# Patient Record
Sex: Female | Born: 1965 | Race: Black or African American | Hispanic: No | Smoking: Current some day smoker
Health system: Southern US, Community
[De-identification: ages and names within clinical notes are randomized; demographics above are authoritative.]

## PROBLEM LIST (undated history)

## (undated) DIAGNOSIS — F1910 Other psychoactive substance abuse, uncomplicated: Secondary | ICD-10-CM

## (undated) DIAGNOSIS — N200 Calculus of kidney: Secondary | ICD-10-CM

## (undated) DIAGNOSIS — Z21 Asymptomatic human immunodeficiency virus [HIV] infection status: Secondary | ICD-10-CM

## (undated) DIAGNOSIS — T7840XA Allergy, unspecified, initial encounter: Secondary | ICD-10-CM

## (undated) DIAGNOSIS — B20 Human immunodeficiency virus [HIV] disease: Secondary | ICD-10-CM

## (undated) DIAGNOSIS — E785 Hyperlipidemia, unspecified: Secondary | ICD-10-CM

## (undated) DIAGNOSIS — K219 Gastro-esophageal reflux disease without esophagitis: Secondary | ICD-10-CM

## (undated) HISTORY — DX: Hyperlipidemia, unspecified: E78.5

## (undated) HISTORY — PX: APPENDECTOMY: SHX54

## (undated) HISTORY — DX: Other psychoactive substance abuse, uncomplicated: F19.10

## (undated) HISTORY — DX: Calculus of kidney: N20.0

## (undated) HISTORY — PX: CHOLECYSTECTOMY: SHX55

## (undated) HISTORY — PX: TONSILLECTOMY: SUR1361

## (undated) HISTORY — DX: Allergy, unspecified, initial encounter: T78.40XA

## (undated) HISTORY — DX: Gastro-esophageal reflux disease without esophagitis: K21.9

## (undated) HISTORY — PX: INDUCED ABORTION: SHX677

---

## 2004-08-10 ENCOUNTER — Ambulatory Visit: Payer: Self-pay | Admitting: Internal Medicine

## 2004-08-10 LAB — CONVERTED CEMR LAB
HCV Ab: NEGATIVE
Hep A Total Ab: NEGATIVE
Hep B S Ab: NEGATIVE
Hepatitis B Surface Ag: NEGATIVE

## 2004-08-24 ENCOUNTER — Ambulatory Visit: Payer: Self-pay | Admitting: Internal Medicine

## 2004-11-24 ENCOUNTER — Emergency Department (HOSPITAL_COMMUNITY): Admission: EM | Admit: 2004-11-24 | Discharge: 2004-11-24 | Payer: Self-pay | Admitting: Emergency Medicine

## 2005-06-12 ENCOUNTER — Emergency Department (HOSPITAL_COMMUNITY): Admission: EM | Admit: 2005-06-12 | Discharge: 2005-06-12 | Payer: Self-pay | Admitting: Emergency Medicine

## 2005-06-14 ENCOUNTER — Ambulatory Visit: Payer: Self-pay | Admitting: Internal Medicine

## 2005-12-27 ENCOUNTER — Ambulatory Visit: Payer: Self-pay | Admitting: Internal Medicine

## 2005-12-28 ENCOUNTER — Ambulatory Visit (HOSPITAL_COMMUNITY): Admission: RE | Admit: 2005-12-28 | Discharge: 2005-12-28 | Payer: Self-pay | Admitting: Family Medicine

## 2006-01-15 ENCOUNTER — Encounter: Admission: RE | Admit: 2006-01-15 | Discharge: 2006-01-15 | Payer: Self-pay | Admitting: Family Medicine

## 2006-02-07 ENCOUNTER — Emergency Department (HOSPITAL_COMMUNITY): Admission: EM | Admit: 2006-02-07 | Discharge: 2006-02-07 | Payer: Self-pay | Admitting: Emergency Medicine

## 2006-02-22 ENCOUNTER — Emergency Department (HOSPITAL_COMMUNITY): Admission: EM | Admit: 2006-02-22 | Discharge: 2006-02-22 | Payer: Self-pay | Admitting: Emergency Medicine

## 2006-04-04 ENCOUNTER — Ambulatory Visit: Payer: Self-pay | Admitting: Internal Medicine

## 2006-05-29 ENCOUNTER — Emergency Department (HOSPITAL_COMMUNITY): Admission: EM | Admit: 2006-05-29 | Discharge: 2006-05-29 | Payer: Self-pay | Admitting: Emergency Medicine

## 2006-08-01 ENCOUNTER — Ambulatory Visit: Payer: Self-pay | Admitting: Internal Medicine

## 2006-08-22 ENCOUNTER — Ambulatory Visit: Payer: Self-pay | Admitting: Internal Medicine

## 2006-11-18 ENCOUNTER — Ambulatory Visit: Payer: Self-pay | Admitting: Internal Medicine

## 2007-01-13 DIAGNOSIS — K219 Gastro-esophageal reflux disease without esophagitis: Secondary | ICD-10-CM | POA: Insufficient documentation

## 2007-01-13 DIAGNOSIS — J309 Allergic rhinitis, unspecified: Secondary | ICD-10-CM | POA: Insufficient documentation

## 2007-01-13 DIAGNOSIS — B20 Human immunodeficiency virus [HIV] disease: Secondary | ICD-10-CM | POA: Insufficient documentation

## 2007-01-13 DIAGNOSIS — G4700 Insomnia, unspecified: Secondary | ICD-10-CM | POA: Insufficient documentation

## 2007-07-31 ENCOUNTER — Ambulatory Visit: Payer: Self-pay | Admitting: Internal Medicine

## 2007-07-31 LAB — CONVERTED CEMR LAB
ALT: 18 units/L (ref 0–35)
AST: 21 units/L (ref 0–37)
Absolute CD4: 442 #/uL (ref 381–1469)
Albumin: 4.4 g/dL (ref 3.5–5.2)
Alkaline Phosphatase: 100 units/L (ref 39–117)
BUN: 13 mg/dL (ref 6–23)
Basophils Absolute: 0 10*3/uL (ref 0.0–0.1)
Basophils Relative: 0 % (ref 0–1)
CD4 T Helper %: 23 % — ABNORMAL LOW (ref 32–62)
CO2: 24 meq/L (ref 19–32)
Calcium: 9.5 mg/dL (ref 8.4–10.5)
Chloride: 105 meq/L (ref 96–112)
Creatinine, Ser: 0.84 mg/dL (ref 0.40–1.20)
Eosinophils Absolute: 0.1 10*3/uL (ref 0.0–0.7)
Eosinophils Relative: 1 % (ref 0–5)
Glucose, Bld: 88 mg/dL (ref 70–99)
HCT: 35.3 % — ABNORMAL LOW (ref 36.0–46.0)
HIV 1 RNA Quant: <50 copies/mL (ref <50)
HIV-1 RNA Quant, Log: <1.7 (ref <1.70)
Hemoglobin: 11.7 g/dL — ABNORMAL LOW (ref 12.0–15.0)
Lymphocytes Relative: 32 % (ref 12–46)
Lymphs Abs: 1.9 10*3/uL (ref 0.7–4.0)
MCHC: 33.1 g/dL (ref 30.0–36.0)
MCV: 95.7 fL (ref 78.0–100.0)
Monocytes Absolute: 0.3 10*3/uL (ref 0.1–1.0)
Monocytes Relative: 5 % (ref 3–12)
Neutro Abs: 3.7 10*3/uL (ref 1.7–7.7)
Neutrophils Relative %: 62 % (ref 43–77)
Platelets: 284 10*3/uL (ref 150–400)
Potassium: 4.1 meq/L (ref 3.5–5.3)
RBC: 3.69 M/uL — ABNORMAL LOW (ref 3.87–5.11)
RDW: 12.7 % (ref 11.5–15.5)
Sodium: 139 meq/L (ref 135–145)
Total Bilirubin: 0.4 mg/dL (ref 0.3–1.2)
Total Lymphocytes %: 32 % (ref 12–46)
Total Protein: 8 g/dL (ref 6.0–8.3)
Total lymphocyte count: 1920 cells/mcL (ref 700–3300)
WBC, lymph enumeration: 6 10*3/uL (ref 4.0–10.5)
WBC: 6 10*3/uL (ref 4.0–10.5)

## 2007-08-05 ENCOUNTER — Ambulatory Visit: Payer: Self-pay | Admitting: Internal Medicine

## 2007-08-05 ENCOUNTER — Encounter: Payer: Self-pay | Admitting: Internal Medicine

## 2007-08-05 LAB — CONVERTED CEMR LAB
Chlamydia, DNA Probe: NEGATIVE
GC Probe Amp, Genital: POSITIVE — AB
Pap Smear: NEGATIVE

## 2007-08-07 ENCOUNTER — Ambulatory Visit: Payer: Self-pay | Admitting: Internal Medicine

## 2007-08-15 ENCOUNTER — Emergency Department (HOSPITAL_COMMUNITY): Admission: EM | Admit: 2007-08-15 | Discharge: 2007-08-15 | Payer: Self-pay | Admitting: Emergency Medicine

## 2007-08-21 ENCOUNTER — Ambulatory Visit: Payer: Self-pay | Admitting: Internal Medicine

## 2007-10-29 ENCOUNTER — Emergency Department (HOSPITAL_COMMUNITY): Admission: EM | Admit: 2007-10-29 | Discharge: 2007-10-29 | Payer: Self-pay | Admitting: Podiatry

## 2008-01-29 ENCOUNTER — Encounter: Payer: Self-pay | Admitting: Internal Medicine

## 2008-01-29 ENCOUNTER — Ambulatory Visit: Payer: Self-pay | Admitting: Internal Medicine

## 2008-01-29 LAB — CONVERTED CEMR LAB
ALT: 14 units/L (ref 0–35)
AST: 14 units/L (ref 0–37)
Absolute CD4: 430 #/uL (ref 381–1469)
Albumin: 4.3 g/dL (ref 3.5–5.2)
Alkaline Phosphatase: 91 units/L (ref 39–117)
BUN: 11 mg/dL (ref 6–23)
Basophils Absolute: 0 10*3/uL (ref 0.0–0.1)
Basophils Relative: 1 % (ref 0–1)
CD4 T Helper %: 24 % — ABNORMAL LOW (ref 32–62)
CO2: 23 meq/L (ref 19–32)
Calcium: 9 mg/dL (ref 8.4–10.5)
Chlamydia, Swab/Urine, PCR: NEGATIVE
Chloride: 107 meq/L (ref 96–112)
Creatinine, Ser: 0.72 mg/dL (ref 0.40–1.20)
Eosinophils Absolute: 0.1 10*3/uL (ref 0.0–0.7)
Eosinophils Relative: 1 % (ref 0–5)
FSH: 5.1 milliintl units/mL
GC Probe Amp, Urine: NEGATIVE
Glucose, Bld: 88 mg/dL (ref 70–99)
HCT: 33.1 % — ABNORMAL LOW (ref 36.0–46.0)
HIV 1 RNA Quant: <50 copies/mL (ref <50)
HIV-1 RNA Quant, Log: <1.7 (ref <1.70)
Hemoglobin: 10.7 g/dL — ABNORMAL LOW (ref 12.0–15.0)
Lymphocytes Relative: 28 % (ref 12–46)
Lymphs Abs: 1.8 10*3/uL (ref 0.7–4.0)
MCHC: 32.3 g/dL (ref 30.0–36.0)
MCV: 97.1 fL (ref 78.0–100.0)
Monocytes Absolute: 0.4 10*3/uL (ref 0.1–1.0)
Monocytes Relative: 7 % (ref 3–12)
Neutro Abs: 4.1 10*3/uL (ref 1.7–7.7)
Neutrophils Relative %: 64 % (ref 43–77)
Platelets: 297 10*3/uL (ref 150–400)
Potassium: 3.5 meq/L (ref 3.5–5.3)
RBC: 3.41 M/uL — ABNORMAL LOW (ref 3.87–5.11)
RDW: 12.8 % (ref 11.5–15.5)
Sodium: 140 meq/L (ref 135–145)
Total Bilirubin: 0.5 mg/dL (ref 0.3–1.2)
Total Lymphocytes %: 28 % (ref 12–46)
Total Protein: 7.8 g/dL (ref 6.0–8.3)
Total lymphocyte count: 1792 cells/mcL (ref 700–3300)
WBC, lymph enumeration: 6.4 10*3/uL (ref 4.0–10.5)
WBC: 6.4 10*3/uL (ref 4.0–10.5)

## 2008-03-11 ENCOUNTER — Ambulatory Visit: Payer: Self-pay | Admitting: Internal Medicine

## 2008-03-18 ENCOUNTER — Ambulatory Visit: Payer: Self-pay | Admitting: Internal Medicine

## 2008-06-04 ENCOUNTER — Emergency Department (HOSPITAL_COMMUNITY): Admission: EM | Admit: 2008-06-04 | Discharge: 2008-06-05 | Payer: Self-pay | Admitting: *Deleted

## 2008-06-10 ENCOUNTER — Encounter: Payer: Self-pay | Admitting: Internal Medicine

## 2008-06-10 ENCOUNTER — Ambulatory Visit: Payer: Self-pay | Admitting: Internal Medicine

## 2008-06-10 LAB — CONVERTED CEMR LAB
ALT: 11 units/L (ref 0–35)
AST: 14 units/L (ref 0–37)
Absolute CD4: 535 #/uL (ref 381–1469)
Albumin: 4.2 g/dL (ref 3.5–5.2)
Alkaline Phosphatase: 100 units/L (ref 39–117)
BUN: 14 mg/dL (ref 6–23)
Basophils Absolute: 0 10*3/uL (ref 0.0–0.1)
Basophils Relative: 0 % (ref 0–1)
CD4 T Helper %: 27 % — ABNORMAL LOW (ref 32–62)
CO2: 26 meq/L (ref 19–32)
Calcium: 9.6 mg/dL (ref 8.4–10.5)
Chloride: 102 meq/L (ref 96–112)
Creatinine, Ser: 0.86 mg/dL (ref 0.40–1.20)
Eosinophils Absolute: 0.1 10*3/uL (ref 0.0–0.7)
Eosinophils Relative: 2 % (ref 0–5)
Glucose, Bld: 86 mg/dL (ref 70–99)
HCT: 36.2 % (ref 36.0–46.0)
HIV 1 RNA Quant: <48 copies/mL (ref <48)
HIV-1 RNA Quant, Log: <1.68 (ref <1.68)
Hemoglobin: 11.6 g/dL — ABNORMAL LOW (ref 12.0–15.0)
Lymphocytes Relative: 22 % (ref 12–46)
Lymphs Abs: 2 10*3/uL (ref 0.7–4.0)
MCHC: 32 g/dL (ref 30.0–36.0)
MCV: 95 fL (ref 78.0–100.0)
Monocytes Absolute: 0.5 10*3/uL (ref 0.1–1.0)
Monocytes Relative: 6 % (ref 3–12)
Neutro Abs: 6.3 10*3/uL (ref 1.7–7.7)
Neutrophils Relative %: 70 % (ref 43–77)
Platelets: 390 10*3/uL (ref 150–400)
Potassium: 4.4 meq/L (ref 3.5–5.3)
RBC: 3.81 M/uL — ABNORMAL LOW (ref 3.87–5.11)
RDW: 12.4 % (ref 11.5–15.5)
Sodium: 141 meq/L (ref 135–145)
Total Bilirubin: 0.4 mg/dL (ref 0.3–1.2)
Total Lymphocytes %: 22 % (ref 12–46)
Total Protein: 8.2 g/dL (ref 6.0–8.3)
Total lymphocyte count: 1980 cells/mcL (ref 700–3300)
WBC, lymph enumeration: 9 10*3/uL (ref 4.0–10.5)
WBC: 9 10*3/uL (ref 4.0–10.5)

## 2008-08-05 ENCOUNTER — Telehealth: Payer: Self-pay | Admitting: Internal Medicine

## 2008-08-10 ENCOUNTER — Telehealth (INDEPENDENT_AMBULATORY_CARE_PROVIDER_SITE_OTHER): Payer: Self-pay | Admitting: *Deleted

## 2008-08-19 ENCOUNTER — Encounter: Payer: Self-pay | Admitting: Internal Medicine

## 2008-08-19 ENCOUNTER — Ambulatory Visit: Payer: Self-pay | Admitting: Internal Medicine

## 2008-08-19 DIAGNOSIS — G589 Mononeuropathy, unspecified: Secondary | ICD-10-CM | POA: Insufficient documentation

## 2008-08-19 LAB — CONVERTED CEMR LAB
ALT: 11 units/L (ref 0–35)
AST: 13 units/L (ref 0–37)
Absolute CD4: 392 #/uL (ref 381–1469)
Albumin: 4.2 g/dL (ref 3.5–5.2)
Alkaline Phosphatase: 93 units/L (ref 39–117)
BUN: 13 mg/dL (ref 6–23)
Basophils Absolute: 0 10*3/uL (ref 0.0–0.1)
Basophils Relative: 1 % (ref 0–1)
CD4 Count: 392 microliters
CD4 T Helper %: 25 % — ABNORMAL LOW (ref 32–62)
CO2: 25 meq/L (ref 19–32)
Calcium: 9.5 mg/dL (ref 8.4–10.5)
Chloride: 104 meq/L (ref 96–112)
Creatinine, Ser: 0.77 mg/dL (ref 0.40–1.20)
Eosinophils Absolute: 0.2 10*3/uL (ref 0.0–0.7)
Eosinophils Relative: 2 % (ref 0–5)
Glucose, Bld: 96 mg/dL (ref 70–99)
HCT: 35 % — ABNORMAL LOW (ref 36.0–46.0)
HIV 1 RNA Quant: <48 copies/mL (ref <48)
HIV-1 RNA Quant, Log: <1.68 (ref <1.68)
Hemoglobin: 11.3 g/dL — ABNORMAL LOW (ref 12.0–15.0)
Lymphocytes Relative: 18 % (ref 12–46)
Lymphs Abs: 1.6 10*3/uL (ref 0.7–4.0)
MCHC: 32.3 g/dL (ref 30.0–36.0)
MCV: 96.7 fL (ref 78.0–100.0)
Monocytes Absolute: 0.7 10*3/uL (ref 0.1–1.0)
Monocytes Relative: 8 % (ref 3–12)
Neutro Abs: 6.2 10*3/uL (ref 1.7–7.7)
Neutrophils Relative %: 71 % (ref 43–77)
Platelets: 252 10*3/uL (ref 150–400)
Potassium: 4.2 meq/L (ref 3.5–5.3)
RBC: 3.62 M/uL — ABNORMAL LOW (ref 3.87–5.11)
RDW: 13.3 % (ref 11.5–15.5)
Sodium: 139 meq/L (ref 135–145)
Total Bilirubin: 0.6 mg/dL (ref 0.3–1.2)
Total Lymphocytes %: 18 % (ref 12–46)
Total Protein: 7.7 g/dL (ref 6.0–8.3)
Total lymphocyte count: 1566 cells/mcL (ref 700–3300)
WBC, lymph enumeration: 8.7 10*3/uL (ref 4.0–10.5)
WBC: 8.7 10*3/uL (ref 4.0–10.5)

## 2008-08-22 ENCOUNTER — Ambulatory Visit (HOSPITAL_COMMUNITY): Admission: RE | Admit: 2008-08-22 | Discharge: 2008-08-22 | Payer: Self-pay | Admitting: Internal Medicine

## 2008-09-02 ENCOUNTER — Telehealth: Payer: Self-pay | Admitting: Internal Medicine

## 2008-09-16 ENCOUNTER — Telehealth: Payer: Self-pay | Admitting: Internal Medicine

## 2008-09-27 ENCOUNTER — Telehealth: Payer: Self-pay | Admitting: Internal Medicine

## 2008-10-01 ENCOUNTER — Encounter: Payer: Self-pay | Admitting: Internal Medicine

## 2008-10-27 ENCOUNTER — Encounter: Payer: Self-pay | Admitting: Internal Medicine

## 2008-12-09 ENCOUNTER — Encounter: Payer: Self-pay | Admitting: Internal Medicine

## 2008-12-09 ENCOUNTER — Ambulatory Visit: Payer: Self-pay | Admitting: Internal Medicine

## 2008-12-09 DIAGNOSIS — M5020 Other cervical disc displacement, unspecified cervical region: Secondary | ICD-10-CM | POA: Insufficient documentation

## 2008-12-09 LAB — CONVERTED CEMR LAB
ALT: 15 units/L (ref 0–35)
AST: 19 units/L (ref 0–37)
Absolute CD4: 671 #/uL (ref 381–1469)
Albumin: 4.5 g/dL (ref 3.5–5.2)
Alkaline Phosphatase: 101 units/L (ref 39–117)
BUN: 16 mg/dL (ref 6–23)
Basophils Absolute: 0 10*3/uL (ref 0.0–0.1)
Basophils Relative: 0 % (ref 0–1)
CD4 T Helper %: 26 % — ABNORMAL LOW (ref 32–62)
CO2: 25 meq/L (ref 19–32)
Calcium: 9.3 mg/dL (ref 8.4–10.5)
Chloride: 103 meq/L (ref 96–112)
Creatinine, Ser: 1.09 mg/dL (ref 0.40–1.20)
Eosinophils Absolute: 0.1 10*3/uL (ref 0.0–0.7)
Eosinophils Relative: 1 % (ref 0–5)
Glucose, Bld: 98 mg/dL (ref 70–99)
HCT: 34.8 % — ABNORMAL LOW (ref 36.0–46.0)
HIV 1 RNA Quant: <48 copies/mL (ref <48)
HIV-1 RNA Quant, Log: <1.68 (ref <1.68)
Hemoglobin: 11.7 g/dL — ABNORMAL LOW (ref 12.0–15.0)
Lymphocytes Relative: 30 % (ref 12–46)
Lymphs Abs: 2.6 10*3/uL (ref 0.7–4.0)
MCHC: 33.6 g/dL (ref 30.0–36.0)
MCV: 92.3 fL (ref 78.0–100.0)
Monocytes Absolute: 0.6 10*3/uL (ref 0.1–1.0)
Monocytes Relative: 7 % (ref 3–12)
Neutro Abs: 5.3 10*3/uL (ref 1.7–7.7)
Neutrophils Relative %: 62 % (ref 43–77)
Platelets: 278 10*3/uL (ref 150–400)
Potassium: 4 meq/L (ref 3.5–5.3)
RBC: 3.77 M/uL — ABNORMAL LOW (ref 3.87–5.11)
RDW: 13.2 % (ref 11.5–15.5)
Sodium: 139 meq/L (ref 135–145)
Total Bilirubin: 0.5 mg/dL (ref 0.3–1.2)
Total Protein: 8.1 g/dL (ref 6.0–8.3)
Total lymphocyte count: 2580 cells/mcL (ref 700–3300)
WBC: 8.6 10*3/uL (ref 4.0–10.5)

## 2008-12-23 ENCOUNTER — Encounter: Payer: Self-pay | Admitting: Internal Medicine

## 2008-12-23 ENCOUNTER — Ambulatory Visit: Payer: Self-pay | Admitting: Internal Medicine

## 2009-03-11 ENCOUNTER — Telehealth: Payer: Self-pay | Admitting: Internal Medicine

## 2009-03-28 ENCOUNTER — Telehealth: Payer: Self-pay | Admitting: Internal Medicine

## 2009-04-07 ENCOUNTER — Ambulatory Visit: Payer: Self-pay | Admitting: Family Medicine

## 2009-04-07 ENCOUNTER — Encounter: Payer: Self-pay | Admitting: Internal Medicine

## 2009-04-07 LAB — CONVERTED CEMR LAB
ALT: 20 units/L (ref 0–35)
AST: 18 units/L (ref 0–37)
Absolute CD4: 588 #/uL (ref 381–1469)
Albumin: 4 g/dL (ref 3.5–5.2)
Alkaline Phosphatase: 105 units/L (ref 39–117)
BUN: 9 mg/dL (ref 6–23)
Basophils Absolute: 0 10*3/uL (ref 0.0–0.1)
Basophils Relative: 0 % (ref 0–1)
CD4 T Helper %: 30 % — ABNORMAL LOW (ref 32–62)
CO2: 23 meq/L (ref 19–32)
Calcium: 8.8 mg/dL (ref 8.4–10.5)
Chloride: 106 meq/L (ref 96–112)
Creatinine, Ser: 0.79 mg/dL (ref 0.40–1.20)
Eosinophils Absolute: 0.2 10*3/uL (ref 0.0–0.7)
Eosinophils Relative: 2 % (ref 0–5)
Glucose, Bld: 71 mg/dL (ref 70–99)
HCT: 35.2 % — ABNORMAL LOW (ref 36.0–46.0)
HIV 1 RNA Quant: 82 copies/mL — ABNORMAL HIGH (ref <48)
HIV-1 RNA Quant, Log: 1.91 — ABNORMAL HIGH (ref <1.68)
Hemoglobin: 11.1 g/dL — ABNORMAL LOW (ref 12.0–15.0)
Lymphocytes Relative: 28 % (ref 12–46)
Lymphs Abs: 2 10*3/uL (ref 0.7–4.0)
MCHC: 31.5 g/dL (ref 30.0–36.0)
MCV: 98.1 fL (ref 78.0–100.0)
Monocytes Absolute: 0.6 10*3/uL (ref 0.1–1.0)
Monocytes Relative: 9 % (ref 3–12)
Neutro Abs: 4.3 10*3/uL (ref 1.7–7.7)
Neutrophils Relative %: 61 % (ref 43–77)
Platelets: 288 10*3/uL (ref 150–400)
Potassium: 4 meq/L (ref 3.5–5.3)
RBC: 3.59 M/uL — ABNORMAL LOW (ref 3.87–5.11)
RDW: 12.6 % (ref 11.5–15.5)
Sodium: 143 meq/L (ref 135–145)
Total Bilirubin: 0.5 mg/dL (ref 0.3–1.2)
Total Protein: 7.5 g/dL (ref 6.0–8.3)
Total lymphocyte count: 1960 cells/mcL (ref 700–3300)
WBC: 7 10*3/uL (ref 4.0–10.5)

## 2009-04-22 ENCOUNTER — Telehealth: Payer: Self-pay | Admitting: Internal Medicine

## 2009-04-28 ENCOUNTER — Telehealth: Payer: Self-pay | Admitting: Internal Medicine

## 2009-06-20 ENCOUNTER — Encounter: Payer: Self-pay | Admitting: Internal Medicine

## 2009-06-20 ENCOUNTER — Ambulatory Visit: Payer: Self-pay | Admitting: Internal Medicine

## 2009-06-20 LAB — CONVERTED CEMR LAB
ALT: 11 units/L (ref 0–35)
AST: 15 units/L (ref 0–37)
Absolute CD4: 730 #/uL (ref 381–1469)
Albumin: 4.5 g/dL (ref 3.5–5.2)
Alkaline Phosphatase: 115 units/L (ref 39–117)
BUN: 19 mg/dL (ref 6–23)
Basophils Absolute: 0 10*3/uL (ref 0.0–0.1)
Basophils Relative: 0 % (ref 0–1)
CD4 T Helper %: 28 % — ABNORMAL LOW (ref 32–62)
CO2: 23 meq/L (ref 19–32)
Calcium: 9.6 mg/dL (ref 8.4–10.5)
Chloride: 104 meq/L (ref 96–112)
Cholesterol: 240 mg/dL — ABNORMAL HIGH (ref 0–200)
Creatinine, Ser: 1 mg/dL (ref 0.40–1.20)
Eosinophils Absolute: 0.1 10*3/uL (ref 0.0–0.7)
Eosinophils Relative: 1 % (ref 0–5)
Glucose, Bld: 92 mg/dL (ref 70–99)
HCT: 38.1 % (ref 36.0–46.0)
HDL: 45 mg/dL (ref >39)
HIV 1 RNA Quant: <48 copies/mL (ref <48)
HIV-1 RNA Quant, Log: <1.68 (ref <1.68)
Hemoglobin: 12.6 g/dL (ref 12.0–15.0)
LDL Cholesterol: 136 mg/dL — ABNORMAL HIGH (ref 0–99)
Lymphocytes Relative: 33 % (ref 12–46)
Lymphs Abs: 2.6 10*3/uL (ref 0.7–4.0)
MCHC: 33.1 g/dL (ref 30.0–36.0)
MCV: 92.5 fL (ref 78.0–100.0)
Monocytes Absolute: 0.6 10*3/uL (ref 0.1–1.0)
Monocytes Relative: 8 % (ref 3–12)
Neutro Abs: 4.5 10*3/uL (ref 1.7–7.7)
Neutrophils Relative %: 58 % (ref 43–77)
Platelets: 268 10*3/uL (ref 150–400)
Potassium: 4.5 meq/L (ref 3.5–5.3)
RBC: 4.12 M/uL (ref 3.87–5.11)
RDW: 13 % (ref 11.5–15.5)
Sodium: 141 meq/L (ref 135–145)
Total Bilirubin: 0.5 mg/dL (ref 0.3–1.2)
Total CHOL/HDL Ratio: 5.3
Total Protein: 7.9 g/dL (ref 6.0–8.3)
Total lymphocyte count: 2607 cells/mcL (ref 700–3300)
Triglycerides: 294 mg/dL — ABNORMAL HIGH (ref <150)
VLDL: 59 mg/dL — ABNORMAL HIGH (ref 0–40)
WBC: 7.9 10*3/uL (ref 4.0–10.5)

## 2009-07-07 ENCOUNTER — Ambulatory Visit: Payer: Self-pay | Admitting: Internal Medicine

## 2009-07-07 ENCOUNTER — Encounter: Payer: Self-pay | Admitting: Internal Medicine

## 2009-07-07 DIAGNOSIS — N952 Postmenopausal atrophic vaginitis: Secondary | ICD-10-CM | POA: Insufficient documentation

## 2009-07-08 ENCOUNTER — Encounter: Admission: RE | Admit: 2009-07-08 | Discharge: 2009-07-08 | Payer: Self-pay | Admitting: Internal Medicine

## 2009-07-20 ENCOUNTER — Encounter: Admission: RE | Admit: 2009-07-20 | Discharge: 2009-07-20 | Payer: Self-pay | Admitting: Internal Medicine

## 2009-08-22 ENCOUNTER — Emergency Department (HOSPITAL_COMMUNITY): Admission: EM | Admit: 2009-08-22 | Discharge: 2009-08-22 | Payer: Self-pay | Admitting: Emergency Medicine

## 2009-08-23 ENCOUNTER — Telehealth: Payer: Self-pay | Admitting: Internal Medicine

## 2009-09-28 ENCOUNTER — Ambulatory Visit: Payer: Self-pay | Admitting: Internal Medicine

## 2009-09-28 LAB — CONVERTED CEMR LAB
ALT: 14 units/L (ref 0–35)
AST: 17 units/L (ref 0–37)
Albumin: 4.5 g/dL (ref 3.5–5.2)
Alkaline Phosphatase: 126 units/L — ABNORMAL HIGH (ref 39–117)
BUN: 16 mg/dL (ref 6–23)
Basophils Absolute: 0 10*3/uL (ref 0.0–0.1)
Basophils Relative: 0 % (ref 0–1)
CO2: 23 meq/L (ref 19–32)
Calcium: 9.8 mg/dL (ref 8.4–10.5)
Chloride: 103 meq/L (ref 96–112)
Creatinine, Ser: 1.07 mg/dL (ref 0.40–1.20)
Eosinophils Absolute: 0.1 10*3/uL (ref 0.0–0.7)
Eosinophils Relative: 1 % (ref 0–5)
Glucose, Bld: 83 mg/dL (ref 70–99)
HCT: 34.5 % — ABNORMAL LOW (ref 36.0–46.0)
HIV 1 RNA Quant: <48 copies/mL (ref <48)
HIV-1 RNA Quant, Log: <1.68 (ref <1.68)
Hemoglobin: 11.6 g/dL — ABNORMAL LOW (ref 12.0–15.0)
Lymphocytes Relative: 24 % (ref 12–46)
Lymphs Abs: 2.2 10*3/uL (ref 0.7–4.0)
MCHC: 33.6 g/dL (ref 30.0–36.0)
MCV: 92.5 fL (ref 78.0–100.0)
Monocytes Absolute: 0.5 10*3/uL (ref 0.1–1.0)
Monocytes Relative: 5 % (ref 3–12)
Neutro Abs: 6.6 10*3/uL (ref 1.7–7.7)
Neutrophils Relative %: 70 % (ref 43–77)
Platelets: 281 10*3/uL (ref 150–400)
Potassium: 4 meq/L (ref 3.5–5.3)
RBC: 3.73 M/uL — ABNORMAL LOW (ref 3.87–5.11)
RDW: 12.5 % (ref 11.5–15.5)
Sodium: 137 meq/L (ref 135–145)
Total Bilirubin: 0.7 mg/dL (ref 0.3–1.2)
Total Protein: 7.9 g/dL (ref 6.0–8.3)
WBC: 9.4 10*3/uL (ref 4.0–10.5)

## 2009-10-21 ENCOUNTER — Ambulatory Visit: Payer: Self-pay | Admitting: Internal Medicine

## 2009-10-21 ENCOUNTER — Encounter (INDEPENDENT_AMBULATORY_CARE_PROVIDER_SITE_OTHER): Payer: Self-pay | Admitting: *Deleted

## 2009-10-21 DIAGNOSIS — N951 Menopausal and female climacteric states: Secondary | ICD-10-CM | POA: Insufficient documentation

## 2009-10-21 LAB — CONVERTED CEMR LAB: Pap Smear: NEGATIVE

## 2009-11-04 ENCOUNTER — Emergency Department (HOSPITAL_COMMUNITY): Admission: EM | Admit: 2009-11-04 | Discharge: 2009-11-04 | Payer: Self-pay | Admitting: Emergency Medicine

## 2009-11-08 ENCOUNTER — Encounter: Payer: Self-pay | Admitting: Internal Medicine

## 2009-11-18 ENCOUNTER — Ambulatory Visit: Payer: Self-pay | Admitting: Internal Medicine

## 2009-11-18 DIAGNOSIS — N3000 Acute cystitis without hematuria: Secondary | ICD-10-CM | POA: Insufficient documentation

## 2009-11-25 ENCOUNTER — Telehealth: Payer: Self-pay | Admitting: Internal Medicine

## 2010-01-04 ENCOUNTER — Encounter: Payer: Self-pay | Admitting: Internal Medicine

## 2010-02-28 ENCOUNTER — Telehealth (INDEPENDENT_AMBULATORY_CARE_PROVIDER_SITE_OTHER): Payer: Self-pay | Admitting: *Deleted

## 2010-04-27 ENCOUNTER — Telehealth (INDEPENDENT_AMBULATORY_CARE_PROVIDER_SITE_OTHER): Payer: Self-pay | Admitting: *Deleted

## 2010-05-01 ENCOUNTER — Encounter (INDEPENDENT_AMBULATORY_CARE_PROVIDER_SITE_OTHER): Payer: Self-pay | Admitting: *Deleted

## 2010-05-15 ENCOUNTER — Ambulatory Visit: Payer: Self-pay | Admitting: Internal Medicine

## 2010-05-15 LAB — CONVERTED CEMR LAB
ALT: 20 units/L (ref 0–35)
AST: 23 units/L (ref 0–37)
Albumin: 4.5 g/dL (ref 3.5–5.2)
Alkaline Phosphatase: 128 units/L — ABNORMAL HIGH (ref 39–117)
BUN: 9 mg/dL (ref 6–23)
Basophils Absolute: 0 10*3/uL (ref 0.0–0.1)
Basophils Relative: 0 % (ref 0–1)
CO2: 27 meq/L (ref 19–32)
Calcium: 9.3 mg/dL (ref 8.4–10.5)
Chloride: 102 meq/L (ref 96–112)
Creatinine, Ser: 0.87 mg/dL (ref 0.40–1.20)
Eosinophils Absolute: 0.1 10*3/uL (ref 0.0–0.7)
Eosinophils Relative: 1 % (ref 0–5)
Glucose, Bld: 99 mg/dL (ref 70–99)
HCT: 36.9 % (ref 36.0–46.0)
HIV 1 RNA Quant: <20 copies/mL (ref <20)
HIV-1 RNA Quant, Log: <1.3 (ref <1.30)
Hemoglobin: 11.8 g/dL — ABNORMAL LOW (ref 12.0–15.0)
Lymphocytes Relative: 25 % (ref 12–46)
Lymphs Abs: 1.9 10*3/uL (ref 0.7–4.0)
MCHC: 32 g/dL (ref 30.0–36.0)
MCV: 96.1 fL (ref 78.0–100.0)
Monocytes Absolute: 0.3 10*3/uL (ref 0.1–1.0)
Monocytes Relative: 4 % (ref 3–12)
Neutro Abs: 5.6 10*3/uL (ref 1.7–7.7)
Neutrophils Relative %: 70 % (ref 43–77)
Platelets: 291 10*3/uL (ref 150–400)
Potassium: 4.1 meq/L (ref 3.5–5.3)
RBC: 3.84 M/uL — ABNORMAL LOW (ref 3.87–5.11)
RDW: 12.2 % (ref 11.5–15.5)
Sodium: 139 meq/L (ref 135–145)
Total Bilirubin: 0.5 mg/dL (ref 0.3–1.2)
Total Protein: 7.7 g/dL (ref 6.0–8.3)
WBC: 7.9 10*3/uL (ref 4.0–10.5)

## 2010-06-15 ENCOUNTER — Ambulatory Visit
Admission: RE | Admit: 2010-06-15 | Discharge: 2010-06-15 | Payer: Self-pay | Source: Home / Self Care | Attending: Internal Medicine | Admitting: Internal Medicine

## 2010-06-15 DIAGNOSIS — F32A Depression, unspecified: Secondary | ICD-10-CM | POA: Insufficient documentation

## 2010-06-15 DIAGNOSIS — F329 Major depressive disorder, single episode, unspecified: Secondary | ICD-10-CM | POA: Insufficient documentation

## 2010-07-02 ENCOUNTER — Encounter: Payer: Self-pay | Admitting: Internal Medicine

## 2010-07-11 NOTE — Miscellaneous (Signed)
Summary: Orders Update  Clinical Lists Changes  Orders: Added new Test order of T-CBC w/Diff (85025-10010) - Signed Added new Test order of T-CD4SP ([facility]) (CD4SP) - Signed Added new Test order of T-Comprehensive Metabolic Panel (80053-22900) - Signed Added new Test order of T-HIV Viral Load (87536-83319) - Signed 

## 2010-07-11 NOTE — Progress Notes (Signed)
Summary: tramadol refill  Phone Note From Pharmacy   Caller: [facility] Call For: Dr. Philipp Deputy  Reason for Call: Needs renewal Summary of Call: Faxed requested to refill Tramadol 50mg .  Please advise.  Follow-up for Phone Call        ok with 3 refills Follow-up by: Yisroel Ramming MD,  April 27, 2010 11:21 AM    Prescriptions: Janean Sark 50 MG  TABS (TRAMADOL HCL) Take 1 tablet by mouth every 8 hours  #120 x 3   Entered by:   Jennet Maduro RN   Authorized by:   Yisroel Ramming MD   Signed by:   Jennet Maduro RN on 05/01/2010   Method used:   Telephoned to ...       [facility] (mail-order)       [address] [place], [place]  [address]       Ph: [phone]       Fax: [phone]   RxID:   [id number]

## 2010-07-11 NOTE — Miscellaneous (Signed)
Summary: Office Visit ([facility])    Vital Signs:  Patient profile:   45 year old female Menstrual status:  irregular Weight:      175.8 pounds Temp:     97.9 degrees F oral Pulse rate:   66 / minute Pulse rhythm:   regular Resp:     18 per minute BP sitting:   119 / 79  (left arm)  Vitals Entered By: Sharen Heck RN (July 07, 2009 3:04 PM) CC: annual exam Is Patient Diabetic? No Pain Assessment Patient in pain? no       Does patient need assistance? Functional Status Self care Ambulation Normal, Bedbound   CC:  annual exam.  History of Present Illness: Pt here for PE.  She c/o some vaginal dryness.  PAP scheduled next week.  Preventive Screening-Counseling & Management  Alcohol-Tobacco     Alcohol drinks/day: 0     Smoking Status: current     Smoking Cessation Counseling: yes     Packs/Day: 0.25     Year Started: 1980  Caffeine-Diet-Exercise     Caffeine use/day: 8     Does Patient Exercise: no  Current Problems (verified): 1)  Herniated Cervical Disc  (ICD-722.0) 2)  Neuropathy  (ICD-355.9) 3)  Allergic Rhinitis  (ICD-477.9) 4)  Insomnia, Chronic  (ICD-307.42) 5)  HIV Disease  (ICD-042) 6)  Gerd  (ICD-530.81)  Current Medications (verified): 1)  Kaletra 200-50 Mg Tabs (Lopinavir-Ritonavir) .... Take 2 Tablets By Mouth Twice A Day 2)  Truvada 200-300 Mg Tabs (Emtricitabine-Tenofovir) .... Take 1 Tablet By Mouth Once A Day 3)  Prilosec 20 Mg  Cpdr (Omeprazole) .... Take 1 Tablet By Mouth Once A Day 4)  Bactrim Ds 800-160 Mg  Tabs (Sulfamethoxazole-Trimethoprim) .... Take 1 Tablet By Mouth Once A Day 5)  Trazodone Hcl 50 Mg  Tabs (Trazodone Hcl) .... Take 1 Tablet By Mouth At Bedtime 6)  Ultram 50 Mg  Tabs (Tramadol Hcl) .... Take 1 Tablet By Mouth Every 8 Hours 7)  Claritin 10 Mg  Tabs (Loratadine) .... Take 1 Tablet By Mouth Once A Day 8)  Gabapentin 300 Mg .... One By Mouth Tid 9)  Premarin 0.625 Mg/gm Crea (Estrogens, Conjugated) .... Aplly  Once Daily As Directed 10)  Triamcinolone Acetonide 0.1 % Crea (Triamcinolone Acetonide) .... Apply Two Times A Day  Allergies (verified): No Known Drug Allergies   Review of Systems  The patient denies anorexia, fever, and weight loss.     Physical Exam  General:  alert, well-developed, well-nourished, and well-hydrated.   Head:  normocephalic and atraumatic.   Eyes:  pupils equal, pupils round, and pupils reactive to light.   Ears:  R ear normal and L ear normal.   Nose:  no external deformity and nose piercing noted.   Mouth:  pharynx pink and moist.   Lungs:  normal breath sounds.   Heart:  normal rate, regular rhythm, and no murmur.   Abdomen:  soft, non-tender, normal bowel sounds, no distention, and no masses.   Msk:  normal ROM and no joint tenderness.     Impression & Recommendations:  Problem # 1:  PREVENTIVE HEALTH CARE (ICD-V70.0)  PE wnl will schedule a mammogram PAP scheduled for next week  Orders: Est. Patient age 12-64 [phone])  Problem # 2:  VAGINITIS, ATROPHIC, MILD (ICD-627.3)  Her updated medication list for this problem includes:    Premarin 0.625 Mg/gm Crea (Estrogens, conjugated) .Marland Kitchen... Aplly once daily as directed  Medications Added to Medication  List This Visit: 1)  Premarin 0.625 Mg/gm Crea (Estrogens, conjugated) .... Aplly once daily as directed 2)  Triamcinolone Acetonide 0.1 % Crea (Triamcinolone acetonide) .... Apply two times a day   Patient Instructions: 1)  Please schedule a follow-up appointment in 3 months. Prescriptions: PREMARIN 0.625 MG/GM CREA (ESTROGENS, CONJUGATED) aplly once daily as directed  #1 month x 1   Entered and Authorized by:   Yisroel Ramming MD   Signed by:   Yisroel Ramming MD on 07/07/2009   Method used:   Print then Give to Patient   RxID:   [id number] TRIAMCINOLONE ACETONIDE 0.1 % CREA (TRIAMCINOLONE ACETONIDE) apply two times a day  #60gm x 1   Entered and Authorized by:   Yisroel Ramming MD   Signed by:    Yisroel Ramming MD on 07/07/2009   Method used:   Print then Give to Patient   RxID:   [id number]

## 2010-07-11 NOTE — Miscellaneous (Signed)
Summary: Vanessa Browning update  Clinical Lists Changes  Observations: Added new observation of PAYOR: Medicaid (10/27/2008 9:51) Added new observation of RWTITLE: D (10/27/2008 9:51) Added new observation of AIDSDAP: No (10/27/2008 9:51) Added new observation of FAMILYSIZE: 1  (10/27/2008 9:51) Added new observation of HIV RISK BEH: Heterosexual contact  (10/27/2008 9:51) Added new observation of INFECTDIS MD: Philipp Deputy  (10/27/2008 9:51) Added new observation of DATE1STVISIT: 08/10/2004  (10/27/2008 9:51) Added new observation of HIV INIT DX: 06/11/2004  (10/27/2008 9:51) Added new observation of MARITAL STAT: Single  (10/27/2008 9:51) Added new observation of GENDER: Female  (10/27/2008 9:51) Added new observation of RACE: African American  (10/27/2008 9:51) Added new observation of RW VITAL STA: Active  (10/27/2008 9:51) Added new observation of PATNTCOUNTY: [place]  (10/27/2008 9:51) Added new observation of RWPARTICIP: No  (10/27/2008 9:51) Added new observation of PCPPROPRESC: Yes  (10/27/2008 9:51) Added new observation of HAARTPRESC: Yes  (10/27/2008 9:51) Added new observation of CD4 COUNT: 392 microliters (08/19/2008 9:51) Added new observation of FLU VAX: Historical  (06/10/2008 10:18) Added new observation of PAP SMEAR: negative  (08/05/2007 9:51) Added new observation of LAST PAP DAT: 08/05/07  (08/05/2007 9:51) Added new observation of HEP C AB: negative  (08/10/2004 9:51) Added new observation of HEP_C_DATE: 08/10/04  (08/10/2004 9:51) Added new observation of ANTI-HBS: negative  (08/10/2004 9:51) Added new observation of HBS_AB_DATE: 08/10/04  (08/10/2004 9:51) Added new observation of HBSAG: negative  (08/10/2004 9:51) Added new observation of HBS_AG_DATE: 08/10/04  (08/10/2004 9:51) Added new observation of ANTI-HAV: negative  (08/10/2004 9:51) Added new observation of HEP_A_DATE: 08/10/04  (08/10/2004 9:51)           Immunization History:  Influenza Immunization  History:    Influenza:  historical (06/10/2008)

## 2010-07-11 NOTE — Miscellaneous (Signed)
Summary: RW Update  Clinical Lists Changes  Observations: Added new observation of DATE1STVISIT: 11/18/2009 (01/04/2010 11:53)

## 2010-07-11 NOTE — Progress Notes (Signed)
Summary: refill/mld  Phone Note Refill Request      Prescriptions: GABAPENTIN 300 MG One by mouth tid  #90 x 6   Entered by:   Paulo Fruit  BS,CPht II,MPH   Authorized by:   Yisroel Ramming MD   Signed by:   Paulo Fruit  BS,CPht II,MPH on 11/25/2009   Method used:   Telephoned to ...       [facility] (mail-order)       [address] [place], [place]  [address]       Ph: [phone]       Fax: [phone]   RxID:   [id number] TRUVADA 200-300 MG TABS (EMTRICITABINE-TENOFOVIR) Take 1 tablet by mouth once a day  #30 x 6   Entered by:   Paulo Fruit  BS,CPht II,MPH   Authorized by:   Yisroel Ramming MD   Signed by:   Paulo Fruit  BS,CPht II,MPH on 11/25/2009   Method used:   Telephoned to ...       [facility] (mail[facility]order)       [address] [place], [place]  [address]       Ph: [phone]       Fax: [phone]   RxID:   [id number]  Paulo Fruit  BS,CPht II,MPH  November 25, 2009 11:05 AM

## 2010-07-11 NOTE — Progress Notes (Signed)
Summary: Tramadol refill request  Phone Note Refill Request   Refills Requested: Medication #1:  ULTRAM 50 MG  TABS Take 1 tablet by mouth every 8 hours   Dosage confirmed as above?Dosage Confirmed   Brand Name Necessary? No   Supply Requested: 1 month   Last Refilled: 08/18/2008  Method Requested: Fax to Local Pharmacy Initial call taken by: Sharen Heck RN,  September 02, 2008 9:18 AM  Follow-up for Phone Call        too early Follow-up by: Yisroel Ramming MD,  September 02, 2008 2:24 PM    Denial faxed to pharmacy. Sharen Heck RN  September 03, 2008 2:39 PM

## 2010-07-11 NOTE — Assessment & Plan Note (Signed)
Summary: er/fu from 5/27 [mkj]   CC:  follow-up visit from ER for UTI  and pt. never started Prempro (lost RX).  History of Present Illness: Pt here for f/u from an ED visit about 2 weeks ago. She went to the Ed with flank and abdominal pain.  she was diagnosed with a UTI and treated with Cipro. Her pain has resolved. She is planning on moving back home to [place] city In august. She wants to make sure she has na ID MD set up before she moves.  I told her to contact her case manager at [facility] for information about HIV Providers in the area.   Preventive Screening-Counseling & Management  Alcohol-Tobacco     Alcohol drinks/day: occasional     Smoking Status: current     Smoking Cessation Counseling: yes     Packs/Day: <0.25     Year Started: 1980  Caffeine-Diet-Exercise     Caffeine use/day: coffee and soda 3-4     Does Patient Exercise: no  Hep-HIV-STD-Contraception     HIV Risk: no risk noted  Safety-Violence-Falls     Seat Belt Use: yes      Sexual History:  n/a.        Drug Use:  former.    Comments: pt. declined condoms   Updated Prior Medication List: KALETRA 200-50 MG TABS (LOPINAVIR-RITONAVIR) Take 2 tablets by mouth twice a day TRUVADA 200-300 MG TABS (EMTRICITABINE-TENOFOVIR) Take 1 tablet by mouth once a day PRILOSEC 20 MG  CPDR (OMEPRAZOLE) Take 1 tablet by mouth once a day TRAZODONE HCL 50 MG  TABS (TRAZODONE HCL) Take 1 tablet by mouth at bedtime ULTRAM 50 MG  TABS (TRAMADOL HCL) Take 1 tablet by mouth every 8 hours CLARITIN 10 MG  TABS (LORATADINE) Take 1 tablet by mouth once a day * GABAPENTIN 300 MG One by mouth tid PREMPRO 0.625-2.5 MG TABS (CONJ ESTROG-MEDROXYPROGEST ACE) Take 1 tablet by mouth once a day ENSURE  LIQD (NUTRITIONAL SUPPLEMENTS) one can two times a day  Current Allergies (reviewed today): No known allergies  Past History:  Past Medical History: Last updated: 01/13/2007 GERD HIV disease Allergic rhinitis  Review of Systems  The  patient denies anorexia, fever, weight loss, and abdominal pain.    Vital Signs:  Patient profile:   45 year old female Menstrual status:  irregular Height:      62 inches (157.48 cm) Weight:      167.4 pounds (76.09 kg) BMI:     30.73 Temp:     98.3 degrees F (36.83 degrees C) oral Pulse rate:   87 / minute BP sitting:   104 / 79  (right arm)  Vitals Entered By: Wendall Mola CMA [place]) (November 18, 2009 10:05 AM) CC: follow-up visit from ER for UTI , pt. never started Prempro (lost RX) Is Patient Diabetic? No Pain Assessment Patient in pain? no      Nutritional Status BMI of > 30 = obese Nutritional Status Detail appetite "good"  Does patient need assistance? Functional Status Self care Ambulation Normal Comments no missed doses of HAART meds per patient   Physical Exam  General:  alert, well-developed, well-nourished, and well-hydrated.   Head:  normocephalic and atraumatic.   Mouth:  pharynx pink and moist.   Lungs:  normal breath sounds.     Impression & Recommendations:  Problem # 1:  Hx of ACUTE CYSTITIS (ICD-595.0)  resolved f/u if symptoms return  Orders: Est. Patient Level III ([address])

## 2010-07-11 NOTE — Progress Notes (Signed)
Summary: med refills/clarify Ultram  Phone Note Refill Request Message from:  Patient on 04/27/09  Refills Requested: Medication #1:  ULTRAM 50 MG  TABS Take 1 tablet by mouth every 8 hours   Dosage confirmed as above?Dosage Confirmed   Brand Name Necessary? No   Supply Requested: 1 month  Medication #2:  PRILOSEC 20 MG  CPDR Take 1 tablet by mouth once a day   Dosage confirmed as above?Dosage Confirmed   Brand Name Necessary? No   Supply Requested: 1 month How many refills of Ulatram can be called in?   Method Requested: Telephone to Pharmacy Initial call taken by: Sharen Heck RN,  April 28, 2009 6:33 PM  Follow-up for Phone Call        3 Follow-up by: Yisroel Ramming MD,  April 29, 2009 11:23 AM    Prescriptions: Janean Sark 50 MG  TABS (TRAMADOL HCL) Take 1 tablet by mouth every 8 hours  #120 x 2   Entered by:   Sharen Heck RN   Authorized by:   Yisroel Ramming MD   Signed by:   Yisroel Ramming MD on 05/03/2009   Method used:   Telephoned to ...       [facility] ([facility])       [address] [place], [place]  [address]       Ph: [phone]       Fax: [phone]   RxID:   [id number] PRILOSEC 20 MG  CPDR (OMEPRAZOLE) Take 1 tablet by mouth once a day  #30 x 3   Entered by:   Sharen Heck RN   Authorized by:   Yisroel Ramming MD   Signed by:   Yisroel Ramming MD on 04/29/2009   Method used:   Telephoned to ...       [facility] ([facility])       [address] [place], [place]  [address]       Ph: [phone]       Fax: [phone]   RxID:   [id number]

## 2010-07-11 NOTE — Miscellaneous (Signed)
Summary: Office Visit ([facility])    Vital Signs:  Patient profile:   45 year old female Menstrual status:  irregular Weight:      170.3 pounds Temp:     98 degrees F oral Pulse rate:   79 / minute Pulse rhythm:   regular Resp:     16 per minute BP sitting:   109 / 73  (left arm)  Vitals Entered By: Sharen Heck RN (April 07, 2009 10:19 AM) CC: f/u 05, desire flu shot and possible B12 injection Is Patient Diabetic? No Pain Assessment Patient in pain? no       Does patient need assistance? Functional Status Self care Ambulation Normal   CC:  f/u 05 and desire flu shot and possible B12 injection.  History of Present Illness: Pt had a URI last week - starting to feel better.  She still has some chest congestion. She would like a B12 shot today.  Preventive Screening-Counseling & Management  Alcohol-Tobacco     Alcohol drinks/day: 0     Smoking Status: current     Smoking Cessation Counseling: yes     Packs/Day: 0.25     Year Started: 1982  Caffeine-Diet-Exercise     Caffeine use/day: 8     Does Patient Exercise: no  Current Problems (verified): 1)  Herniated Cervical Disc  (ICD-722.0) 2)  Neuropathy  (ICD-355.9) 3)  Allergic Rhinitis  (ICD-477.9) 4)  Insomnia, Chronic  (ICD-307.42) 5)  HIV Disease  (ICD-042) 6)  Gerd  (ICD-530.81)  Current Medications (verified): 1)  Kaletra 200-50 Mg Tabs (Lopinavir-Ritonavir) .... Take 2 Tablets By Mouth Twice A Day 2)  Truvada 200-300 Mg Tabs (Emtricitabine-Tenofovir) .... Take 1 Tablet By Mouth Once A Day 3)  Prilosec 20 Mg  Cpdr (Omeprazole) .... Take 1 Tablet By Mouth Once A Day 4)  Bactrim Ds 800-160 Mg  Tabs (Sulfamethoxazole-Trimethoprim) .... Take 1 Tablet By Mouth Once A Day 5)  Trazodone Hcl 50 Mg  Tabs (Trazodone Hcl) .... Take 1 Tablet By Mouth At Bedtime 6)  Ultram 50 Mg  Tabs (Tramadol Hcl) .... Take 1 Tablet By Mouth Every 8 Hours 7)  Claritin 10 Mg  Tabs (Loratadine) .... Take 1 Tablet By Mouth  Once A Day 8)  Gabapentin 300 Mg .... One By Mouth Tid  Allergies (verified): No Known Drug Allergies   Review of Systems  The patient denies anorexia and hemoptysis.     Physical Exam  General:  alert, well-developed, well-nourished, and well-hydrated.   Head:  normocephalic and atraumatic.   Mouth:  pharynx pink and moist.   Lungs:  normal breath sounds.     Impression & Recommendations:  Problem # 1:  HIV DISEASE (ICD-042) Will obtain labs today.  Pt to continue current meds.  Influenza vaccine given today. Orders: Est. Patient Level III ([address]) T-CBC w/Diff [phone]) T-CD4SP [facility]) (CD4SP) T-Comprehensive Metabolic Panel [phone]) T-HIV Viral Load [id number])  Diagnostics Reviewed:  CD4: 392 (08/19/2008)   WBC: 8.6 (12/09/2008)   PMN (bands): 0 (08/19/2008)   Hgb: 11.7 (12/09/2008)   HCT: 34.8 (12/09/2008)   Platelets: 278 (12/09/2008) HIV-1 RNA: <48 copies/mL (12/09/2008)   HBSAg: negative (08/10/2004)  Other Orders: Influenza Vaccine NON MCR (41324) Vit B12 1000 mcg (M0102)   Patient Instructions: 1)  Please schedule a follow-up appointment in 3 months.   Influenza Vaccine    Vaccine Type: Fluvax Non-MCR    Site: right deltoid    Mfr: Sanofi Pasteur    Dose: 0.5 ml  Route: IM    Given by: Sharen Heck RN    Exp. Date: 12/08/2009    Lot #: [id number]    VIS given: 01/02/07 version given April 07, 2009.  Flu Vaccine Consent Questions    Do you have a history of severe allergic reactions to this vaccine? no    Any prior history of allergic reactions to egg and/or gelatin? no    Do you have a sensitivity to the preservative Thimersol? no    Do you have a past history of Guillan-Barre Syndrome? no    Do you currently have an acute febrile illness? no    Have you ever had a severe reaction to latex? no    Vaccine information given and explained to patient? yes    Are you currently pregnant? no    Medication Administration  Injection  # 1:    Medication: Vit B12 1000 mcg    Diagnosis: INSOMNIA, CHRONIC (ICD-307.42)    Route: IM    Site: L deltoid    Exp Date: 09/10/2010    Lot #: [id number]    Mfr: [employer]ent    Patient tolerated injection without complications    Given by: Sharen Heck RN (April 07, 2009 12:48 PM)  Orders Added: 1)  Est. Patient Level III [[address]] 2)  T-CBC w/Diff [24401-02725] 3)  T-CD4SP [facility]) [CD4SP] 4)  T-Comprehensive Metabolic Panel [80053-22900] 5)  T-HIV Viral Load [phone] 6)  Influenza Vaccine NON MCR [00028] 7)  Vit B12 1000 mcg [J3420]

## 2010-07-11 NOTE — Progress Notes (Signed)
Summary: PPD  Phone Note Outgoing Call   Call placed by: Annice Pih Summary of Call: Pt. is due for PPD at next office visit Initial call taken by: Wendall Mola CMA [place] [facility]),  February 28, 2010 3:52 PM

## 2010-07-11 NOTE — Progress Notes (Signed)
Summary: Message  Phone Note Call from Patient Call back at [phone]   Summary of Call: Ptis requesting pCS Initial call taken by: Michelle Nasuti,  September 16, 2008 10:53 AM  Follow-up for Phone Call        ok Follow-up by: Yisroel Ramming MD,  September 16, 2008 11:08 AM  Additional Follow-up for Phone Call Additional follow up Details #1::        order/referral  written and faxed to [facility] [phone]).  **pt is also requesting mri results Additional Follow-up by: Michelle Nasuti,  September 16, 2008 2:21 PM    Additional Follow-up for Phone Call Additional follow up Details #2::    She has a small herniated disc. Would she like a referral to PT? Follow-up by: Yisroel Ramming MD,  September 16, 2008 3:02 PM  Additional Follow-up for Phone Call Additional follow up Details #3:: Details for Additional Follow-up Action Taken: unable to contact pt by phone for mri results, all numbers listed are wrong or disconnected Additional Follow-up by: Michelle Nasuti,  September 20, 2008 4:14 PM

## 2010-07-11 NOTE — Miscellaneous (Signed)
Summary: Office Visit ([facility])    Vitals Entered By: Sharen Heck RN (June 20, 2009 2:54 PM)  History of Present Illness: Pt c/o generalized body aches. She would like to get a physical. She has been taking her meds every day.  Current Problems (verified): 1)  Herniated Cervical Disc  (ICD-722.0) 2)  Neuropathy  (ICD-355.9) 3)  Allergic Rhinitis  (ICD-477.9) 4)  Insomnia, Chronic  (ICD-307.42) 5)  HIV Disease  (ICD-042) 6)  Gerd  (ICD-530.81)  Current Medications (verified): 1)  Kaletra 200-50 Mg Tabs (Lopinavir-Ritonavir) .... Take 2 Tablets By Mouth Twice A Day 2)  Truvada 200-300 Mg Tabs (Emtricitabine-Tenofovir) .... Take 1 Tablet By Mouth Once A Day 3)  Prilosec 20 Mg  Cpdr (Omeprazole) .... Take 1 Tablet By Mouth Once A Day 4)  Bactrim Ds 800-160 Mg  Tabs (Sulfamethoxazole-Trimethoprim) .... Take 1 Tablet By Mouth Once A Day 5)  Trazodone Hcl 50 Mg  Tabs (Trazodone Hcl) .... Take 1 Tablet By Mouth At Bedtime 6)  Ultram 50 Mg  Tabs (Tramadol Hcl) .... Take 1 Tablet By Mouth Every 8 Hours 7)  Claritin 10 Mg  Tabs (Loratadine) .... Take 1 Tablet By Mouth Once A Day 8)  Gabapentin 300 Mg .... One By Mouth Tid  Allergies: No Known Drug Allergies   Review of Systems  The patient denies anorexia, fever, and weight loss.     Physical Exam  General:  alert, well-developed, well-nourished, and well-hydrated.   Head:  normocephalic and atraumatic.   Mouth:  pharynx pink and moist.   Lungs:  normal breath sounds.   Heart:  normal rate and regular rhythm.      Impression & Recommendations:  Problem # 1:  HIV DISEASE (ICD-042) will obtain labs today. Pt is to continue current meds.  Will schedule for a PAP/ Diagnostics Reviewed:  CD4: 392 (08/19/2008)   WBC: 7.0 (04/07/2009)   PMN (bands): 0 (08/19/2008)   Hgb: 11.1 (04/07/2009)   HCT: 35.2 (04/07/2009)   Platelets: 288 (04/07/2009) HIV-1 RNA: 82 (04/07/2009)   HBSAg: negative (08/10/2004)  Other  Orders: T-CD4SP ([facility]) (CD4SP) T-HIV Viral Load [id number]) T-Comprehensive Metabolic Panel [id number]) T-CBC w/Diff (96295-28413) T-Lipid Profile (24401-02725) Est. Patient Level III ([address])    Patient Instructions: 1)  schedule patient for PE with me  Appended Document: Office Visit ([facility])      Vital Signs:  Patient profile:   45 year old female Menstrual status:  irregular Weight:      170 pounds Temp:     98.0 degrees F oral Pulse rate:   71 / minute Pulse rhythm:   regular Resp:     20 per minute BP sitting:   119 / 84  (left arm)  Vitals Entered By: Sharen Heck RN (June 20, 2009 4:25 PM) CC: f/u 05, c/o generalized pain and intermittent chest pain on 06/17/08 but no pain today Pain Assessment Patient in pain? no       Does patient need assistance? Functional Status Self care Ambulation Normal   CC:  f/u 05 and c/o generalized pain and intermittent chest pain on 06/17/08 but no pain today.  Preventive Screening-Counseling & Management  Alcohol-Tobacco     Alcohol drinks/day: 0     Smoking Status: current     Smoking Cessation Counseling: yes     Packs/Day: 0.25     Year Started: 1980  Caffeine-Diet-Exercise     Caffeine use/day: 8     Does Patient Exercise: no  Allergies (  verified): No Known Drug Allergies

## 2010-07-11 NOTE — Miscellaneous (Signed)
Summary: Office Visit ([facility])    Vital Signs:  Patient profile:   45 year old female Menstrual status:  irregular LMP:     08/09/2008 Weight:      154.5 pounds Temp:     97.4 degrees F oral Pulse rate:   80 / minute Pulse rhythm:   regular Resp:     20 per minute BP sitting:   100 / 68  (left arm)  Vitals Entered By: Sharen Heck RN (August 19, 2008 10:58 AM) Is Patient Diabetic? No Pain Assessment Patient in pain? yes     Location: extremities Intensity: 6 Type: sharp  Have you ever been in a relationship where you felt threatened, hurt or afraid?No   Does patient need assistance? Functional Status Self care Ambulation Normal Menstrual Status irregular Enter LMP: 08/09/2008    History of Present Illness: Pt c/o numbness and tingling in all her extremeties - worse at night. She is also having a lot of pain. She is unable to function well at home due to the pain and would like PCS services She is taking her HIV meds every day.  Preventive Screening-Counseling & Management     Alcohol drinks/day: 0     Smoking Status: current     Smoking Cessation Counseling: yes     Packs/Day: 0.25     Year Started: 1980     Caffeine use/day: 8     Does Patient Exercise: no  Current Problems (verified): 1)  Neuropathy  (ICD-355.9) 2)  Allergic Rhinitis  (ICD-477.9) 3)  Insomnia, Chronic  (ICD-307.42) 4)  HIV Disease  (ICD-042) 5)  Gerd  (ICD-530.81)  Current Medications (verified): 1)  Kaletra 200-50 Mg Tabs (Lopinavir-Ritonavir) .... Take 2 Tablets By Mouth Twice A Day 2)  Truvada 200-300 Mg Tabs (Emtricitabine-Tenofovir) .... Take 1 Tablet By Mouth Once A Day 3)  Prilosec 20 Mg  Cpdr (Omeprazole) .... Take 1 Tablet By Mouth Once A Day 4)  Bactrim Ds 800-160 Mg  Tabs (Sulfamethoxazole-Trimethoprim) .... Take 1 Tablet By Mouth Once A Day 5)  Trazodone Hcl 50 Mg  Tabs (Trazodone Hcl) .... Take 1 Tablet By Mouth At Bedtime 6)  Ultram 50 Mg  Tabs (Tramadol Hcl) ....  Take 1 Tablet By Mouth Every 8 Hours 7)  Claritin 10 Mg  Tabs (Loratadine) .... Take 1 Tablet By Mouth Once A Day  Allergies (verified): No Known Drug Allergies   Additional History    Menstrual Status:  irregular  Review of Systems  The patient denies anorexia, fever, weight loss, and headaches.      Physical Exam  General:  alert, well-developed, well-nourished, and well-hydrated.   Head:  normocephalic and atraumatic.   Mouth:  pharynx pink and moist.  no thrush  Lungs:  normal breath sounds.     Impression & Recommendations:  Problem # 1:  HIV DISEASE (ICD-042) Last CD4ct was 535 and VL <48.  We will repeat labs today.  She is encouraged to take her meds every day and not miss doses. Her updated medication list for this problem includes:    Bactrim Ds 800-160 Mg Tabs (Sulfamethoxazole-trimethoprim) .Marland Kitchen... Take 1 tablet by mouth once a day  Diagnostics Reviewed:  WBC: 9.0 (06/10/2008)   Hgb: 11.6 (06/10/2008)   HCT: 36.2 (06/10/2008)   Platelets: 390 (06/10/2008) HIV-1 RNA: <48 copies/mL (06/10/2008)     Problem # 2:  NEUROPATHY (ICD-355.9) continue neurontin MRI of c-spine Orders: MRI without Contrast (MRI w/o Contrast)  Other Orders: T-CD4 ([id number]) T-HIV  Viral Load [phone]) T-Comprehensive Metabolic Panel [id number]) T-CBC w/Diff ([id number]) Est. Patient Level IV ([address])    Patient Instructions: 1)  Please schedule a follow-up appointment in 3 months.   C Spine MRI without contrast scheduled for 08/22/08 at 1130 am at [facility]. Auth # [id number].  [facility] called to set up [facility] for patient. Sharen Heck RN  August 20, 2008 10:09 AM

## 2010-07-11 NOTE — Miscellaneous (Signed)
Summary: HIPAA Restrictions  HIPAA Restrictions   Imported By: Florinda Marker 09/28/2009 16:25:13  _____________________________________________________________________  External Attachment:    Type:   Image     Comment:   External Document

## 2010-07-11 NOTE — Progress Notes (Signed)
Summary: refill of meds  Phone Note Refill Request Message from:  Fax from Pharmacy on September 27, 2008 9:05 AM  Refills Requested: Medication #1:  TRUVADA 200-300 MG TABS Take 1 tablet by mouth once a day   Dosage confirmed as above?Dosage Confirmed   Brand Name Necessary? No   Supply Requested: 1 month  Medication #2:  TRAZODONE HCL 50 MG  TABS Take 1 tablet by mouth at bedtime   Dosage confirmed as above?Dosage Confirmed   Brand Name Necessary? No   Supply Requested: 1 month Gabapentin 300 mg by mouth tid   Method Requested: Fax to Local Pharmacy Next Appointment Scheduled: 11/18/08 Initial call taken by: Sharen Heck RN,  September 27, 2008 9:10 AM    New/Updated Medications: * GABAPENTIN 300 MG One by mouth tid   Prescriptions: TRAZODONE HCL 50 MG  TABS (TRAZODONE HCL) Take 1 tablet by mouth at bedtime  #30 x 6   Entered by:   Sharen Heck RN   Authorized by:   Yisroel Ramming MD   Signed by:   Yisroel Ramming MD on 09/27/2008   Method used:   Telephoned to ...       [facility] [employer] (mail-order)       [address]  [place], [place]  [address]       Ph: [phone]       Fax: [phone]   RxID:   [id number] TRUVADA 200-300 MG TABS (EMTRICITABINE-TENOFOVIR) Take 1 tablet by mouth once a day  #30 x 6   Entered by:   Sharen Heck RN   Authorized by:   Yisroel Ramming MD   Signed by:   Yisroel Ramming MD on 09/27/2008   Method used:   Telephoned to ...       [facility] [employer] (mail-order)       [address] [place], [place]  [address]       Ph: [phone]       Fax: [phone]   RxID:   [id number] GABAPENTIN 300 MG One by mouth tid  #90 x 6   Entered by:   Sharen Heck RN   Authorized by:   Yisroel Ramming MD   Signed by:   Yisroel Ramming MD on 09/27/2008   Method used:   Telephoned to ...       [facility] [employer] (mail-order)       [address] [place], [place]  [address]       Ph: [phone]       Fax: [phone]   RxID:    [id number]

## 2010-07-11 NOTE — Progress Notes (Signed)
Summary: Kaletra refill  Phone Note Refill Request Message from:  Fax from Pharmacy on August 05, 2008 8:46 AM  Refills Requested: Medication #1:  KALETRA 200-50 MG TABS Take 2 tablets by mouth twice a day   Dosage confirmed as above?Dosage Confirmed   Supply Requested: 1 month Initial call taken by: Sharen Heck,  August 05, 2008 8:47 AM      Prescriptions: [place] 200-50 MG TABS (LOPINAVIR-RITONAVIR) Take 2 tablets by mouth twice a day  #120 x 6   Entered by:   Sharen Heck   Authorized by:   Yisroel Ramming MD   Signed by:   Sharen Heck on 08/05/2008   Method used:   Printed then faxed to ...       [facility] [employer] (mail-order)       [address] [place], [place]  [address]       Ph: [phone]       Fax: [phone]   RxID:   [id number]

## 2010-07-11 NOTE — Miscellaneous (Signed)
  Clinical Lists Changes  Observations: Added new observation of YEARAIDSPOS: 2006  (05/01/2010 11:23) Added new observation of HIV STATUS: CDC-defined AIDS  (05/01/2010 11:23)

## 2010-07-11 NOTE — Progress Notes (Signed)
Summary: loratadine refills  Phone Note Refill Request Message from:  Fax from Pharmacy on March 28, 2009 4:51 PM  Refills Requested: Medication #1:  CLARITIN 10 MG  TABS Take 1 tablet by mouth once a day   Dosage confirmed as above?Dosage Confirmed   Brand Name Necessary? No   Supply Requested: 1 month  Method Requested: Telephone to Pharmacy Initial call taken by: Sharen Heck RN,  March 28, 2009 4:51 PM    Prescriptions: CLARITIN 10 MG  TABS (LORATADINE) Take 1 tablet by mouth once a day  #30 x 5   Entered by:   Sharen Heck RN   Authorized by:   Yisroel Ramming MD   Signed by:   Yisroel Ramming MD on 03/29/2009   Method used:   Telephoned to ...       [facility] [employer] (mail-order)       [address] [place], [place]  [address]       Ph: [phone]       Fax: [phone]   RxID:   [id number]

## 2010-07-11 NOTE — Miscellaneous (Signed)
Summary: Office Visit ([facility])    Vital Signs:  Patient profile:   45 year old female Menstrual status:  irregular Weight:      163 pounds Pulse (ortho):   91 / minute Pulse rhythm:   regular Resp:     16 per minute BP sitting:   110 / 71  (left arm)  Vitals Entered By: Sharen Heck RN (December 23, 2008 2:06 PM) CC: f/u 05 labs, asking about PT referrals Is Patient Diabetic? No Pain Assessment Patient in pain? yes     Location: left neck and shoulder Intensity: 7 Type: tingling  Does patient need assistance? Functional Status Self care Ambulation Normal   CC:  f/u 05 labs and asking about PT referrals.  History of Present Illness: Pt continues to have right leg pain.  He has not heard anything from PT.  Preventive Screening-Counseling & Management  Alcohol-Tobacco     Alcohol drinks/day: 0     Smoking Status: current     Smoking Cessation Counseling: yes     Packs/Day: 0.25     Year Started: 1982  Caffeine-Diet-Exercise     Caffeine use/day: 8     Does Patient Exercise: no  Current Problems (verified): 1)  Herniated Cervical Disc  (ICD-722.0) 2)  Neuropathy  (ICD-355.9) 3)  Allergic Rhinitis  (ICD-477.9) 4)  Insomnia, Chronic  (ICD-307.42) 5)  HIV Disease  (ICD-042) 6)  Gerd  (ICD-530.81)  Current Medications (verified): 1)  Kaletra 200-50 Mg Tabs (Lopinavir-Ritonavir) .... Take 2 Tablets By Mouth Twice A Day 2)  Truvada 200-300 Mg Tabs (Emtricitabine-Tenofovir) .... Take 1 Tablet By Mouth Once A Day 3)  Prilosec 20 Mg  Cpdr (Omeprazole) .... Take 1 Tablet By Mouth Once A Day 4)  Bactrim Ds 800-160 Mg  Tabs (Sulfamethoxazole-Trimethoprim) .... Take 1 Tablet By Mouth Once A Day 5)  Trazodone Hcl 50 Mg  Tabs (Trazodone Hcl) .... Take 1 Tablet By Mouth At Bedtime 6)  Ultram 50 Mg  Tabs (Tramadol Hcl) .... Take 1 Tablet By Mouth Every 8 Hours 7)  Claritin 10 Mg  Tabs (Loratadine) .... Take 1 Tablet By Mouth Once A Day 8)  Gabapentin 300 Mg .... One  By Mouth Tid  Allergies (verified): No Known Drug Allergies   Review of Systems  The patient denies anorexia, fever, and weight loss.     Physical Exam  General:  alert, well-developed, well-nourished, and well-hydrated.   Head:  normocephalic and atraumatic.   Mouth:  pharynx pink and moist.  no thrush  Lungs:  normal breath sounds.     Impression & Recommendations:  Problem # 1:  HERNIATED CERVICAL DISC (ICD-722.0) awaiting PT referral  Problem # 2:  HIV DISEASE (ICD-042)  Pt.s most recent CD4ct was 671 and VL <48 .  Pt instructed to continue the current antiretroviral regimen.  Pt encouraged to take medication regularly and not miss doses.  Pt will f/u in 3 months for repeat blood work and will see me 2 weeks later.  Diagnostics Reviewed:  CD4: 392 (08/19/2008)   WBC: 8.6 (12/09/2008)   PMN (bands): 0 (08/19/2008)   Hgb: 11.7 (12/09/2008)   HCT: 34.8 (12/09/2008)   Platelets: 278 (12/09/2008) HIV-1 RNA: <48 copies/mL (12/09/2008)   HBSAg: negative (08/10/2004)  Orders: Est. Patient Level III ([address])   Patient Instructions: 1)  Please schedule a follow-up appointment in 3 months.

## 2010-07-11 NOTE — Progress Notes (Signed)
Summary: Med refills  Phone Note Refill Request Message from:  Fax from Pharmacy on August 23, 2009 4:27 PM  Refills Requested: Medication #1:  ULTRAM 50 MG  TABS Take 1 tablet by mouth every 8 hours   Brand Name Necessary? No   Supply Requested: 1 month   Last Refilled: 07/26/2009  Medication #2:  PRILOSEC 20 MG  CPDR Take 1 tablet by mouth once a day   Brand Name Necessary? No   Supply Requested: 1 month   Last Refilled: 07/26/2009  Method Requested: Electronic Initial call taken by: Michelle Nasuti,  August 23, 2009 4:27 PM  Follow-up for Phone Call        prilosec refills called into pharmacy. Is it ok to fill ultram? Follow-up by: Michelle Nasuti,  August 23, 2009 4:32 PM  Additional Follow-up for Phone Call Additional follow up Details #1::        yes x 1 Additional Follow-up by: Yisroel Ramming MD,  August 30, 2009 2:24 PM    Prescriptions: PRILOSEC 20 MG  CPDR (OMEPRAZOLE) Take 1 tablet by mouth once a day  #30 x 3   Entered by:   Michelle Nasuti   Authorized by:   Yisroel Ramming MD   Signed by:   Michelle Nasuti on 08/23/2009   Method used:   Printed then faxed to ...       [facility] [employer] (mail[facility]order)       [address] [place], [place]  [address]       Ph: [phone]       Fax: [phone]   RxID:   [id number]

## 2010-07-11 NOTE — Progress Notes (Signed)
Summary: Vanessa Browning refill  Phone Note Refill Request   Refills Requested: Medication #1:  KALETRA 200-50 MG TABS Take 2 tablets by mouth twice a day   Dosage confirmed as above?Dosage Confirmed   Brand Name Necessary? No   Supply Requested: 1 month  Method Requested: Telephone to Pharmacy Next Appointment Scheduled: 03/24/09 Initial call taken by: Sharen Heck RN,  March 11, 2009 3:56 PM    Prescriptions: Vanessa Browning 200-50 MG TABS (LOPINAVIR-RITONAVIR) Take 2 tablets by mouth twice a day  #120 x 5   Entered by:   Sharen Heck RN   Authorized by:   Yisroel Ramming MD   Signed by:   Yisroel Ramming MD on 03/16/2009   Method used:   Telephoned to ...       [facility] [employer] (mail[facility])       [address] [place], [place]  [address]       Ph: [phone]       Fax: [phone]   RxID:   [id number]

## 2010-07-11 NOTE — Miscellaneous (Signed)
Summary: Office Visit ([facility])    Vital Signs:  Patient profile:   45 year old female Menstrual status:  irregular Weight:      160 pounds Temp:     98.4 degrees F oral Pulse (ortho):   80 / minute Pulse rhythm:   regular Resp:     16 per minute BP sitting:   100 / 74  (left arm)  Vitals Entered By: Sharen Heck RN (December 09, 2008 2:05 PM) CC: reports bil arm and leg pain Is Patient Diabetic? No Pain Assessment Patient in pain? yes     Location: left arm Intensity: 6 Type: aching Onset of pain  constant pain in left arm, numbness in hands, occ right sided chest pain   Does patient need assistance? Functional Status Self care Ambulation Normal   CC:  reports bil arm and leg pain.  History of Present Illness: Pt continues to have the pain into her arms associated with numbness and tingling. Pt also c/o sharp chest pain that lasts a few seconds.  It intermittantly occurs. She is concerned that it is cardiac in nature.  Preventive Screening-Counseling & Management  Alcohol-Tobacco     Alcohol drinks/day: 0     Smoking Status: current     Smoking Cessation Counseling: yes     Packs/Day: 0.25     Year Started: 1982  Caffeine-Diet-Exercise     Caffeine use/day: 8     Does Patient Exercise: no  Current Problems (verified): 1)  Herniated Cervical Disc  (ICD-722.0) 2)  Neuropathy  (ICD-355.9) 3)  Allergic Rhinitis  (ICD-477.9) 4)  Insomnia, Chronic  (ICD-307.42) 5)  HIV Disease  (ICD-042) 6)  Gerd  (ICD-530.81)  Current Medications (verified): 1)  Kaletra 200-50 Mg Tabs (Lopinavir-Ritonavir) .... Take 2 Tablets By Mouth Twice A Day 2)  Truvada 200-300 Mg Tabs (Emtricitabine-Tenofovir) .... Take 1 Tablet By Mouth Once A Day 3)  Prilosec 20 Mg  Cpdr (Omeprazole) .... Take 1 Tablet By Mouth Once A Day 4)  Bactrim Ds 800-160 Mg  Tabs (Sulfamethoxazole-Trimethoprim) .... Take 1 Tablet By Mouth Once A Day 5)  Trazodone Hcl 50 Mg  Tabs (Trazodone Hcl) .... Take  1 Tablet By Mouth At Bedtime 6)  Ultram 50 Mg  Tabs (Tramadol Hcl) .... Take 1 Tablet By Mouth Every 8 Hours 7)  Claritin 10 Mg  Tabs (Loratadine) .... Take 1 Tablet By Mouth Once A Day 8)  Gabapentin 300 Mg .... One By Mouth Tid  Allergies (verified): No Known Drug Allergies   Review of Systems  The patient denies anorexia, fever, weight loss, and dyspnea on exertion.     Physical Exam  General:  alert, well-developed, well-nourished, and well-hydrated.   Head:  normocephalic and atraumatic.   Mouth:  pharynx pink and moist.  no thrush  Lungs:  normal breath sounds.      Impression & Recommendations:  Problem # 1:  HERNIATED CERVICAL DISC (ICD-722.0)  refer to Physical therapy continue neruontin  Orders: Physical Therapy Referral (PT)  Problem # 2:  HIV DISEASE (ICD-042) Will obtain labs today and have pt f/u in 2 weeks.  Pt to take meds every day. Diagnostics Reviewed:  CD4: 392 (08/19/2008)   WBC: 8.7 (08/19/2008)   PMN (bands): 0 (08/19/2008)   Hgb: 11.3 (08/19/2008)   HCT: 35.0 (08/19/2008)   Platelets: 252 (08/19/2008) HIV-1 RNA: <48 copies/mL (08/19/2008)   HBSAg: negative (08/10/2004)  Other Orders: T-CD4SP ([facility]) (CD4SP) T-HIV Viral Load ([id number]) T-Comprehensive Metabolic Panel (  [phone]) T-CBC w/Diff [phone]) Est. Patient Level III ([address])    Patient Instructions: 1)  Please schedule a follow-up appointment in 2 weeks.

## 2010-07-11 NOTE — Progress Notes (Signed)
  Phone Note Refill Request Message from:  Fax from Pharmacy on August 10, 2008 12:35 PM  Refills Requested: Medication #1:  KALETRA 200-50 MG TABS Take 2 tablets by mouth twice a day    Duplicate. Med refilled on 08/05/08. Sharen Heck  August 10, 2008 12:36 PM

## 2010-07-11 NOTE — Assessment & Plan Note (Signed)
Summary: F/U OV/VS   CC:  follow-up visit, lab results, c/o loss of appetite and trouble sleeping, and hot flashes all the time.  History of Present Illness: Pt c/o hot flashes, insomnia and decreased appetite. No abdominal pain or nausea or vomiting.She has been having irregular periods.  She has been taking the trazodone which helps a little. No missed doses of her HIV meds.  Preventive Screening-Counseling & Management  Alcohol-Tobacco     Alcohol drinks/day: occasional     Smoking Status: current     Smoking Cessation Counseling: yes     Packs/Day: <0.25     Year Started: 1982  Caffeine-Diet-Exercise     Caffeine use/day: coffee and soda 3-4     Does Patient Exercise: no  Safety-Violence-Falls     Seat Belt Use: yes      Sexual History:  n/a.        Drug Use:  former.    Comments: pt given condoms   Updated Prior Medication List: KALETRA 200-50 MG TABS (LOPINAVIR-RITONAVIR) Take 2 tablets by mouth twice a day TRUVADA 200-300 MG TABS (EMTRICITABINE-TENOFOVIR) Take 1 tablet by mouth once a day PRILOSEC 20 MG  CPDR (OMEPRAZOLE) Take 1 tablet by mouth once a day TRAZODONE HCL 50 MG  TABS (TRAZODONE HCL) Take 1 tablet by mouth at bedtime ULTRAM 50 MG  TABS (TRAMADOL HCL) Take 1 tablet by mouth every 8 hours CLARITIN 10 MG  TABS (LORATADINE) Take 1 tablet by mouth once a day * GABAPENTIN 300 MG One by mouth tid PREMPRO 0.625-2.5 MG TABS (CONJ ESTROG-MEDROXYPROGEST ACE) Take 1 tablet by mouth once a day ENSURE  LIQD (NUTRITIONAL SUPPLEMENTS) one can two times a day  Current Allergies (reviewed today): No known allergies  Past History:  Past Medical History: Last updated: 01/13/2007 GERD HIV disease Allergic rhinitis  Social History: Sexual History:  n/a Drug Use:  former  Review of Systems       The patient complains of anorexia.  The patient denies fever, abdominal pain, and severe indigestion/heartburn.    Vital Signs:  Patient profile:   45 year old  female Menstrual status:  irregular Height:      62 inches (157.48 cm) Weight:      168.8 pounds (76.73 kg) BMI:     30.99 Temp:     98.1 degrees F (36.72 degrees C) oral Pulse rate:   75 / minute BP sitting:   116 / 80  (right arm)  Vitals Entered By: Wendall Mola CMA Duncan Dull) (Oct 21, 2009 10:05 AM) CC: follow-up visit, lab results, c/o loss of appetite and trouble sleeping, hot flashes all the time Is Patient Diabetic? No Pain Assessment Patient in pain? no      Nutritional Status BMI of 25 - 29 = overweight Nutritional Status Detail appetite "not good"  Does patient need assistance? Functional Status Self care Ambulation Normal Comments no missed doses of meds per patient   Physical Exam  General:  alert, well-developed, well-nourished, and well-hydrated.   Head:  normocephalic and atraumatic.   Mouth:  pharynx pink and moist.   Lungs:  normal breath sounds.      Impression & Recommendations:  Problem # 1:  HIV DISEASE (ICD-042) Pt.s most recent CD4ct was 560  and VL<48  .  Pt instructed to continue the current antiretroviral regimen.  Pt encouraged to take medication regularly and not miss doses.  Pt will f/u in 3 months for repeat blood work and will see me 2  weeks later.  PAP today.  Diagnostics Reviewed:  CD4: 560 (09/29/2009)   WBC: 9.4 (09/28/2009)   PMN (bands): 0 (08/19/2008)   Hgb: 11.6 (09/28/2009)   HCT: 34.5 (09/28/2009)   Platelets: 281 (09/28/2009) HIV-1 RNA: <48 copies/mL (09/28/2009)   HBSAg: negative (08/10/2004)  Problem # 2:  HOT FLASHES (ICD-627.2) associated with insomnia and decreased appetite I think she is perimenopausal Will start a short course of HRT and see if htese symptoms improve. The following medications were removed from the medication list:    Premarin 0.625 Mg/gm Crea (Estrogens, conjugated) .Marland Kitchen... Aplly once daily as directed Her updated medication list for this problem includes:    Prempro 0.625-2.5 Mg Tabs (Conj  estrog-medroxyprogest ace) .Marland Kitchen... Take 1 tablet by mouth once a day  Medications Added to Medication List This Visit: 1)  Prempro 0.625-2.5 Mg Tabs (Conj estrog-medroxyprogest ace) .... Take 1 tablet by mouth once a day 2)  Ensure Liqd (Nutritional supplements) .... One can two times a day  Other Orders: Est. Patient Level III (13086) Future Orders: T-CD4SP ([facility]) (CD4SP) ... 01/19/2010 T-HIV Viral Load [id number]) ... 01/19/2010 T-Comprehensive Metabolic Panel [phone]) ... 01/19/2010 T-CBC w/Diff ([id number]) ... 01/19/2010  Patient Instructions: 1)  Please schedule a follow-up appointment in 3 months, 2 weeks after labs.  Prescriptions: ENSURE  LIQD (NUTRITIONAL SUPPLEMENTS) one can two times a day  #1case x prn   Entered and Authorized by:   Yisroel Ramming MD   Signed by:   Yisroel Ramming MD on 10/21/2009   Method used:   Print then Give to Patient   RxID:   [id number] PREMPRO 0.625-2.5 MG TABS (CONJ ESTROG-MEDROXYPROGEST ACE) Take 1 tablet by mouth once a day  #30 x 5   Entered and Authorized by:   Yisroel Ramming MD   Signed by:   Yisroel Ramming MD on 10/21/2009   Method used:   Print then Give to Patient   RxID:   [id number]   Appended Document: F/U OV/VS pt. called stating that she lost her RX for Prempro.  RX called into [facility] [phone]

## 2010-07-11 NOTE — Miscellaneous (Signed)
Summary: Problem List Update  Clinical Lists Changes  Problems: Added new problem of SCREENING FOR MALIGNANT NEOPLASM OF THE CERVIX (ICD-V76.2)

## 2010-07-11 NOTE — Letter (Signed)
Summary: Results Follow-up Letter  [facility]  [address]   [place], [place] [address]   Phone: [phone]  Fax: [phone]          Nov 08, 2009  [address] [place], [place]  [address]  Dear Vanessa Browning,   The following are the results of your recent test(s):  Test     Result     Pap Smear    Normal_XXX__  Not Normal_____       Comments:  I will see you in one year for your next PAP smear.  Thank you for coming to the Center.  Sincerely,    Jennet Maduro [facility]

## 2010-07-11 NOTE — Assessment & Plan Note (Signed)
Summary: PAP SMEAR   Vital Signs:  Patient profile:   45 year old female Menstrual status:  irregular LMP:     07/12/2009  Vitals Entered By: Jennet Maduro RN (Oct 21, 2009 10:50 AM) CC: PAP smear visit.  Pt. given condoms.  Pt. given educational matertials re:  HIV and women, exercise, diet, heart disease, BSE, nutrition and self-esteem.  Have you ever been in a relationship where you felt threatened, hurt or afraid?No   Does patient need assistance? Functional Status Self care Ambulation Normal LMP (date): 07/12/2009     Enter LMP: 07/12/2009 Last PAP Result negative   Preventive Screening-Counseling & Management  Alcohol-Tobacco     Alcohol drinks/day: occasional     Smoking Status: current     Smoking Cessation Counseling: yes     Packs/Day: <0.25     Year Started: 1980  Caffeine-Diet-Exercise     Caffeine use/day: coffee and soda 3-4     Does Patient Exercise: no  Hep-HIV-STD-Contraception     HIV Risk: no risk noted  Safety-Violence-Falls     Seat Belt Use: yes  Comments: given condoms      Sexual History:  n/a.        Drug Use:  former.    Behavioral Health Assessment Frequency: occasional Do you use recreational drugs? former  Evaluation and Follow-Up  Prevention For Positives: 10/21/2009   Safe sex practices discussed with patient. Condoms offered. Prior Medications: KALETRA 200-50 MG TABS (LOPINAVIR-RITONAVIR) Take 2 tablets by mouth twice a day TRUVADA 200-300 MG TABS (EMTRICITABINE-TENOFOVIR) Take 1 tablet by mouth once a day PRILOSEC 20 MG  CPDR (OMEPRAZOLE) Take 1 tablet by mouth once a day TRAZODONE HCL 50 MG  TABS (TRAZODONE HCL) Take 1 tablet by mouth at bedtime ULTRAM 50 MG  TABS (TRAMADOL HCL) Take 1 tablet by mouth every 8 hours CLARITIN 10 MG  TABS (LORATADINE) Take 1 tablet by mouth once a day GABAPENTIN 300 MG () One by mouth tid Current Allergies: No known allergies  Orders Added: 1)  Est. Patient Level I [[address]] 2)  T-PAP  [facility]) [[address]]           Prevention For Positives: 10/21/2009   Safe sex practices discussed with patient. Condoms offered.                           Appended Document: PAP SMEAR - RYAN [facility] FLOWSHEET UPDATE

## 2010-07-11 NOTE — Progress Notes (Signed)
Summary: med refills  Phone Note Refill Request Message from:  Fax from Pharmacy on April 22, 2009 2:06 PM  Refills Requested: Medication #1:  TRUVADA 200-300 MG TABS Take 1 tablet by mouth once a day   Dosage confirmed as above?Dosage Confirmed   Brand Name Necessary? No   Supply Requested: 1 month  Medication #2:  GABAPENTIN 300 MG One by mouth tid.   Dosage confirmed as above?Dosage Confirmed   Brand Name Necessary? No   Supply Requested: 1 month  Medication #3:  TRAZODONE HCL 50 MG  TABS Take 1 tablet by mouth at bedtime   Dosage confirmed as above?Dosage Confirmed   Brand Name Necessary? No   Supply Requested: 1 month  Method Requested: Telephone to Pharmacy Next Appointment Scheduled: seen 04/07/09 Initial call taken by: Sharen Heck RN,  April 22, 2009 2:07 PM    Prescriptions: GABAPENTIN 300 MG One by mouth tid  #90 x 6   Entered by:   Sharen Heck RN   Authorized by:   Yisroel Ramming MD   Signed by:   Yisroel Ramming MD on 04/22/2009   Method used:   Telephoned to ...       [facility] [employer] [facility] (mail-order)       [address] [place], [place]  [address]       Ph: [phone]       Fax: [phone]   RxID:   [id number] TRAZODONE HCL 50 MG  TABS (TRAZODONE HCL) Take 1 tablet by mouth at bedtime  #30 x 3   Entered by:   Sharen Heck RN   Authorized by:   Yisroel Ramming MD   Signed by:   Yisroel Ramming MD on 04/22/2009   Method used:   Telephoned to ...       [facility] [employer] [facility] (mail-order)       [address] [place], [place]  [address]       Ph: [phone]       Fax: [phone]   RxID:   [id number] TRUVADA 200-300 MG TABS (EMTRICITABINE-TENOFOVIR) Take 1 tablet by mouth once a day  #30 x 5   Entered by:   Sharen Heck RN   Authorized by:   Yisroel Ramming MD   Signed by:   Yisroel Ramming MD on 04/22/2009   Method used:   Telephoned to ...       [facility] [employer] [facility] (mail-order)       [address] [place],  [place]  [address]       Ph: [phone]       Fax: [phone]   RxID:   [id number]

## 2010-07-11 NOTE — Letter (Signed)
Summary: HSE Referral Form   HSE Referral Form   Imported By: Percell Miller 10/01/2008 16:41:12  _____________________________________________________________________  External Attachment:    Type:   Image     Comment:   External Document
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
## 2010-08-01 ENCOUNTER — Observation Stay (HOSPITAL_COMMUNITY)
Admission: EM | Admit: 2010-08-01 | Discharge: 2010-08-01 | Disposition: A | Discharge status: Home / Self Care | Payer: Medicaid Other | Attending: Emergency Medicine | Admitting: Emergency Medicine

## 2010-08-01 DIAGNOSIS — R110 Nausea: Secondary | ICD-10-CM | POA: Insufficient documentation

## 2010-08-01 DIAGNOSIS — K219 Gastro-esophageal reflux disease without esophagitis: Secondary | ICD-10-CM | POA: Insufficient documentation

## 2010-08-01 DIAGNOSIS — Z21 Asymptomatic human immunodeficiency virus [HIV] infection status: Secondary | ICD-10-CM | POA: Insufficient documentation

## 2010-08-01 DIAGNOSIS — R197 Diarrhea, unspecified: Secondary | ICD-10-CM | POA: Insufficient documentation

## 2010-08-01 DIAGNOSIS — R109 Unspecified abdominal pain: Principal | ICD-10-CM | POA: Insufficient documentation

## 2010-08-01 LAB — URINALYSIS, ROUTINE W REFLEX MICROSCOPIC
Bilirubin Urine: NEGATIVE
Hgb urine dipstick: NEGATIVE
Ketones, ur: NEGATIVE mg/dL
Nitrite: NEGATIVE
Protein, ur: NEGATIVE mg/dL
Specific Gravity, Urine: 1.026 (ref 1.005–1.030)
Urine Glucose, Fasting: NEGATIVE mg/dL
Urobilinogen, UA: 0.2 mg/dL (ref 0.0–1.0)
pH: 5.5 (ref 5.0–8.0)

## 2010-08-01 LAB — COMPREHENSIVE METABOLIC PANEL
ALT: 22 U/L (ref 0–35)
AST: 23 U/L (ref 0–37)
Albumin: 3.8 g/dL (ref 3.5–5.2)
Alkaline Phosphatase: 111 U/L (ref 39–117)
BUN: 13 mg/dL (ref 6–23)
CO2: 22 mEq/L (ref 19–32)
Calcium: 8.8 mg/dL (ref 8.4–10.5)
Chloride: 104 mEq/L (ref 96–112)
Creatinine, Ser: 0.91 mg/dL (ref 0.4–1.2)
GFR calc Af Amer: >60 mL/min (ref >60)
GFR calc non Af Amer: >60 mL/min (ref >60)
Glucose, Bld: 95 mg/dL (ref 70–99)
Potassium: 3.7 mEq/L (ref 3.5–5.1)
Sodium: 138 mEq/L (ref 135–145)
Total Bilirubin: 0.7 mg/dL (ref 0.3–1.2)
Total Protein: 7.4 g/dL (ref 6.0–8.3)

## 2010-08-01 LAB — LIPASE, BLOOD: Lipase: 18 U/L (ref 11–59)

## 2010-08-01 LAB — POCT PREGNANCY, URINE: Preg Test, Ur: NEGATIVE

## 2010-08-01 LAB — CBC
HCT: 34 % — ABNORMAL LOW (ref 36.0–46.0)
Hemoglobin: 11.7 g/dL — ABNORMAL LOW (ref 12.0–15.0)
MCH: 32 pg (ref 26.0–34.0)
MCHC: 34.4 g/dL (ref 30.0–36.0)
MCV: 92.9 fL (ref 78.0–100.0)
Platelets: 207 10*3/uL (ref 150–400)
RBC: 3.66 MIL/uL — ABNORMAL LOW (ref 3.87–5.11)
RDW: 12.5 % (ref 11.5–15.5)
WBC: 8.9 10*3/uL (ref 4.0–10.5)

## 2010-08-01 LAB — URINE MICROSCOPIC-ADD ON

## 2010-08-01 LAB — DIFFERENTIAL
Basophils Absolute: 0 10*3/uL (ref 0.0–0.1)
Basophils Relative: 0 % (ref 0–1)
Eosinophils Absolute: 0.1 10*3/uL (ref 0.0–0.7)
Eosinophils Relative: 1 % (ref 0–5)
Lymphocytes Relative: 14 % (ref 12–46)
Lymphs Abs: 1.2 10*3/uL (ref 0.7–4.0)
Monocytes Absolute: 0.6 10*3/uL (ref 0.1–1.0)
Monocytes Relative: 6 % (ref 3–12)
Neutro Abs: 7.1 10*3/uL (ref 1.7–7.7)
Neutrophils Relative %: 80 % — ABNORMAL HIGH (ref 43–77)

## 2010-08-02 LAB — GIARDIA/CRYPTOSPORIDIUM SCREEN(EIA)
Cryptosporidium Screen (EIA): NEGATIVE
Giardia Screen - EIA: NEGATIVE

## 2010-08-04 ENCOUNTER — Ambulatory Visit (INDEPENDENT_AMBULATORY_CARE_PROVIDER_SITE_OTHER): Payer: Medicaid Other | Admitting: Adult Health

## 2010-08-04 ENCOUNTER — Encounter: Payer: Self-pay | Admitting: Adult Health

## 2010-08-04 DIAGNOSIS — K219 Gastro-esophageal reflux disease without esophagitis: Secondary | ICD-10-CM

## 2010-08-05 LAB — STOOL CULTURE

## 2010-08-08 NOTE — Assessment & Plan Note (Signed)
Summary: ER f/u from 2/21 stomach complaint [mkj]   Vital Signs:  Patient profile:   45 year old female Menstrual status:  irregular Height:      62 inches Weight:      161.8 pounds BMI:     29.70 Temp:     98.2 degrees F oral Pulse rate:   67 / minute BP sitting:   129 / 89  (left arm)  Vitals Entered By: Alesia Morin CMA (August 04, 2010 4:03 PM) CC: Pt in today due to recent visit to ER for GERD symptoms. she says that she can get no relief from prilosec or anything over the counter and she can not sleep because it is so bad at night Is Patient Diabetic? No Pain Assessment Patient in pain? yes     Location: abdomen Intensity: 5 Type: burning Nutritional Status BMI of 25 - 29 = overweight Nutritional Status Detail appetite "not so good" she is afraid to sleep  Have you ever been in a relationship where you felt threatened, hurt or afraid?No   Does patient need assistance? Functional Status Self care Ambulation Normal Comments no missed doses   CC:  Pt in today due to recent visit to ER for GERD symptoms. she says that she can get no relief from prilosec or anything over the counter and she can not sleep because it is so bad at night.  History of Present Illness: Ongoing chronic GERD symptoms worsening over past month.  Was given omeperazole which she adjuncted herself with famotidine and oral antacids without relief.  Fearful to eat foods due to GERD symptoms, and having marked difficulty sleeping at noc as a result of worsening GERD symptoms while lying supine.  Preventive Screening-Counseling & Management  Alcohol-Tobacco     Alcohol drinks/day: occasional     Smoking Status: current     Smoking Cessation Counseling: yes     Packs/Day: <0.25     Year Started: 1980  Caffeine-Diet-Exercise     Caffeine use/day: coffee and soda 3-4     Does Patient Exercise: no  Hep-HIV-STD-Contraception     HIV Risk: no risk noted  Safety-Violence-Falls     Seat Belt Use:  yes      Sexual History:  n/a.        Drug Use:  former.        Blood Transfusions:  no.        Travel History:  no.    Allergies (verified): No Known Drug Allergies  Past History:  Past medical, surgical, family and social histories (including risk factors) reviewed for relevance to current acute and chronic problems.  Past Medical History: Reviewed history from 01/13/2007 and no changes required. GERD HIV disease Allergic rhinitis  Family History: Reviewed history and no changes required.  Social History: Reviewed history and no changes required. Blood Transfusions:  no Travel History:  no  Review of Systems       The patient complains of anorexia, abdominal pain, and severe indigestion/heartburn.  The patient denies fever, weight loss, weight gain, vision loss, decreased hearing, hoarseness, chest pain, syncope, dyspnea on exertion, peripheral edema, prolonged cough, headaches, hemoptysis, melena, hematochezia, hematuria, incontinence, genital sores, muscle weakness, suspicious skin lesions, transient blindness, difficulty walking, depression, unusual weight change, abnormal bleeding, enlarged lymph nodes, angioedema, and breast masses.    Physical Exam  General:  alert, well-developed, well-nourished, well-hydrated, and uncomfortable-appearing.   Head:  normocephalic and atraumatic.   Eyes:  vision grossly intact, pupils equal, pupils  round, and pupils reactive to light.   Ears:  R ear normal and L ear normal.   Nose:  no external deformity and no external erythema.   Mouth:  no dental plaque, pharynx pink and moist, and fair dentition.   Neck:  supple, full ROM, and no masses.   Chest Wall:  no deformities, no tenderness, and no mass.   Lungs:  normal respiratory effort and normal breath sounds.   Heart:  normal rate, regular rhythm, no murmur, no gallop, and no rub.   Abdomen:  soft, normal bowel sounds, no distention, and no masses.  (+) epigastric tenderness on  deep palpation Msk:  normal ROM.   Extremities:  No clubbing, cyanosis, edema, or deformity noted with normal full range of motion of all joints.   Neurologic:  alert & oriented X3, cranial nerves II-XII intact, and strength normal in all extremities.   Skin:  Intact without suspicious lesions or rashes Psych:  Oriented X3, memory intact for recent and remote, normally interactive, good eye contact, and slightly anxious.     Impression & Recommendations:  Problem # 1:  GERD (ICD-530.81) GI consult, may need EGD.  Stop Prilosec.  Start Nexium 40mg  by mouth once daily.  Take famotidine 20mg  by mouth two times a day at least for the first 5 days while on Nexium.  If after stopping famotidine, symptoms recur or persist, may resume at two times a day dosing.  Elevate HOB.  Bland, low-fat, low-spice diet. The following medications were removed from the medication list:    Prilosec 20 Mg Cpdr (Omeprazole) .Marland Kitchen... Take 1 tablet by mouth once a day Her updated medication list for this problem includes:    Nexium 40 Mg Cpdr (Esomeprazole magnesium) .Marland Kitchen... 1 capsule by mouth once daily  Orders: Gastroenterology Referral (GI) Est. Patient Level III ([address])  Medications Added to Medication List This Visit: 1)  Nexium 40 Mg Cpdr (Esomeprazole magnesium) .Marland Kitchen.. 1 capsule by mouth once daily  Patient Instructions: 1)  Bland, low spice, low fat diet. 2)  Sleep with head of bed elevated. 3)  Take famotidine (Pepcid or equivalent) 20 mg two times a day for first 5 days while on Nexium. 4)  Avoid eating foods after 7pm at night. Prescriptions: NEXIUM 40 MG CPDR (ESOMEPRAZOLE MAGNESIUM) 1 capsule by mouth once daily  #30 x 3   Entered and Authorized by:   Talmadge Chad NP   Signed by:   Talmadge Chad NP on 08/04/2010   Method used:   Print then Give to Patient   RxID:   [id number]    Orders Added: 1)  Gastroenterology Referral [GI] 2)  Est. Patient Level III [66440]

## 2010-08-21 LAB — T-HELPER CELL (CD4) - (RCID CLINIC ONLY)
CD4 % Helper T Cell: 29 % — ABNORMAL LOW (ref 33–55)
CD4 T Cell Abs: 560 uL (ref 400–2700)

## 2010-08-28 LAB — CBC
HCT: 38.6 % (ref 36.0–46.0)
Hemoglobin: 13.3 g/dL (ref 12.0–15.0)
MCHC: 34.4 g/dL (ref 30.0–36.0)
MCV: 95 fL (ref 78.0–100.0)
Platelets: 274 10*3/uL (ref 150–400)
RBC: 4.06 MIL/uL (ref 3.87–5.11)
RDW: 12.8 % (ref 11.5–15.5)
WBC: 8.4 10*3/uL (ref 4.0–10.5)

## 2010-08-28 LAB — DIFFERENTIAL
Basophils Absolute: 0 10*3/uL (ref 0.0–0.1)
Basophils Relative: 0 % (ref 0–1)
Eosinophils Absolute: 0.1 10*3/uL (ref 0.0–0.7)
Eosinophils Relative: 2 % (ref 0–5)
Lymphocytes Relative: 23 % (ref 12–46)
Lymphs Abs: 1.9 10*3/uL (ref 0.7–4.0)
Monocytes Absolute: 0.4 10*3/uL (ref 0.1–1.0)
Monocytes Relative: 5 % (ref 3–12)
Neutro Abs: 5.8 10*3/uL (ref 1.7–7.7)
Neutrophils Relative %: 70 % (ref 43–77)

## 2010-08-28 LAB — COMPREHENSIVE METABOLIC PANEL
ALT: 19 U/L (ref 0–35)
AST: 20 U/L (ref 0–37)
Albumin: 4.5 g/dL (ref 3.5–5.2)
Alkaline Phosphatase: 137 U/L — ABNORMAL HIGH (ref 39–117)
BUN: 10 mg/dL (ref 6–23)
CO2: 27 mEq/L (ref 19–32)
Calcium: 10.2 mg/dL (ref 8.4–10.5)
Chloride: 103 mEq/L (ref 96–112)
Creatinine, Ser: 0.77 mg/dL (ref 0.4–1.2)
GFR calc Af Amer: >60 mL/min (ref >60)
GFR calc non Af Amer: >60 mL/min (ref >60)
Glucose, Bld: 89 mg/dL (ref 70–99)
Potassium: 4.3 mEq/L (ref 3.5–5.1)
Sodium: 138 mEq/L (ref 135–145)
Total Bilirubin: 0.4 mg/dL (ref 0.3–1.2)
Total Protein: 8.8 g/dL — ABNORMAL HIGH (ref 6.0–8.3)

## 2010-08-28 LAB — URINALYSIS, ROUTINE W REFLEX MICROSCOPIC
Bilirubin Urine: NEGATIVE
Glucose, UA: NEGATIVE mg/dL
Hgb urine dipstick: NEGATIVE
Ketones, ur: NEGATIVE mg/dL
Nitrite: NEGATIVE
Protein, ur: NEGATIVE mg/dL
Specific Gravity, Urine: 1.018 (ref 1.005–1.030)
Urobilinogen, UA: 0.2 mg/dL (ref 0.0–1.0)
pH: 5.5 (ref 5.0–8.0)

## 2010-08-28 LAB — URINE MICROSCOPIC-ADD ON

## 2010-08-28 LAB — URINE CULTURE
Colony Count: NO GROWTH
Culture: NO GROWTH

## 2010-08-29 LAB — T-HELPER CELL (CD4) - (RCID CLINIC ONLY)
CD4 % Helper T Cell: 26 % — ABNORMAL LOW (ref 33–55)
CD4 T Cell Abs: 560 uL (ref 400–2700)

## 2010-09-01 ENCOUNTER — Other Ambulatory Visit: Payer: Medicaid Other

## 2010-09-01 DIAGNOSIS — B20 Human immunodeficiency virus [HIV] disease: Secondary | ICD-10-CM

## 2010-09-01 LAB — RPR

## 2010-09-01 LAB — CBC WITH DIFFERENTIAL/PLATELET
Basophils Absolute: 0 10*3/uL (ref 0.0–0.1)
Basophils Relative: 0 % (ref 0–1)
Eosinophils Absolute: 0.1 10*3/uL (ref 0.0–0.7)
Eosinophils Relative: 1 % (ref 0–5)
HCT: 34.1 % — ABNORMAL LOW (ref 36.0–46.0)
Hemoglobin: 11.3 g/dL — ABNORMAL LOW (ref 12.0–15.0)
Lymphocytes Relative: 26 % (ref 12–46)
Lymphs Abs: 1.9 10*3/uL (ref 0.7–4.0)
MCH: 31.9 pg (ref 26.0–34.0)
MCHC: 33.1 g/dL (ref 30.0–36.0)
MCV: 96.3 fL (ref 78.0–100.0)
Monocytes Absolute: 0.4 10*3/uL (ref 0.1–1.0)
Monocytes Relative: 6 % (ref 3–12)
Neutro Abs: 5 10*3/uL (ref 1.7–7.7)
Neutrophils Relative %: 67 % (ref 43–77)
Platelets: 256 10*3/uL (ref 150–400)
RBC: 3.54 MIL/uL — ABNORMAL LOW (ref 3.87–5.11)
RDW: 12.6 % (ref 11.5–15.5)
WBC: 7.5 10*3/uL (ref 4.0–10.5)

## 2010-09-01 LAB — T-HELPER CELL (CD4) - (RCID CLINIC ONLY)
CD4 % Helper T Cell: 25 % — ABNORMAL LOW (ref 33–55)
CD4 T Cell Abs: 480 uL (ref 400–2700)

## 2010-09-01 NOTE — Progress Notes (Signed)
Addended by: Mariea Clonts on: 09/01/2010 02:47 PM   Modules accepted: Orders

## 2010-09-01 NOTE — Progress Notes (Signed)
Addended by: Mariea Clonts on: 09/01/2010 02:23 PM   Modules accepted: Orders

## 2010-09-02 LAB — COMPLETE METABOLIC PANEL WITH GFR
ALT: 20 U/L (ref 0–35)
AST: 19 U/L (ref 0–37)
Albumin: 4.4 g/dL (ref 3.5–5.2)
Alkaline Phosphatase: 104 U/L (ref 39–117)
BUN: 19 mg/dL (ref 6–23)
CO2: 25 mEq/L (ref 19–32)
Calcium: 10.1 mg/dL (ref 8.4–10.5)
Chloride: 106 mEq/L (ref 96–112)
Creat: 1 mg/dL (ref 0.40–1.20)
GFR, Est African American: >60 mL/min (ref >60)
GFR, Est Non African American: 60 mL/min — ABNORMAL LOW (ref >60)
Glucose, Bld: 94 mg/dL (ref 70–99)
Potassium: 4.8 mEq/L (ref 3.5–5.3)
Sodium: 141 mEq/L (ref 135–145)
Total Bilirubin: 0.5 mg/dL (ref 0.3–1.2)
Total Protein: 8 g/dL (ref 6.0–8.3)

## 2010-09-02 LAB — HIV-1 RNA QUANT-NO REFLEX-BLD
HIV 1 RNA Quant: <20 copies/mL (ref <20)
HIV-1 RNA Quant, Log: <1.3 {Log} (ref <1.30)

## 2010-09-18 ENCOUNTER — Ambulatory Visit: Payer: Medicaid Other | Admitting: Adult Health

## 2010-09-26 ENCOUNTER — Ambulatory Visit (INDEPENDENT_AMBULATORY_CARE_PROVIDER_SITE_OTHER): Payer: Medicaid Other | Admitting: Adult Health

## 2010-09-26 ENCOUNTER — Encounter: Payer: Self-pay | Admitting: Adult Health

## 2010-09-26 DIAGNOSIS — G629 Polyneuropathy, unspecified: Secondary | ICD-10-CM

## 2010-09-26 DIAGNOSIS — G589 Mononeuropathy, unspecified: Secondary | ICD-10-CM

## 2010-09-26 DIAGNOSIS — B20 Human immunodeficiency virus [HIV] disease: Secondary | ICD-10-CM

## 2010-09-26 MED ORDER — RALTEGRAVIR POTASSIUM 400 MG PO TABS: 400.0000 mg | ORAL_TABLET | Freq: Two times a day (BID) | ORAL | Status: DC

## 2010-09-26 MED ORDER — GABAPENTIN 300 MG PO CAPS: 300.0000 mg | ORAL_CAPSULE | Freq: Three times a day (TID) | ORAL | Status: DC

## 2010-09-26 NOTE — Progress Notes (Signed)
[name] returns to clinic for routine followup of her HIV infection. She states that she has been taking her medications without difficulty, although she still continues to have problems with reflux disease. Although since she has been on Nexium. She has had fewer problems with her reflux is still seems to be something occurring once or twice per week. Currently, her antiretroviral regimen includes Truvada with Kaletra. There is no time relationship between when she takes a Kaletra and when she has GERD symptoms, although most often her GERD symptoms occur in the evening and have occurred after she has taken her medications for the night. Otherwise, she has been adherent to her regimens without any missed doses.  Review of systems  Gen.: Denies chills, fevers, fatigue, loss of appetite, may ways, sleep disorders, sweats, weakness, or weight loss. Eyes: Denies any visual disturbances blurring of vision, eye irritation, or eye pain. ENT: Denies any hearing difficulties, difficulty swallowing, hoarseness, or sore throat. Denies sinus congestion, sinus drainage, or ringing in ears. CV: Denies chest pain or discomfort, difficulty breathing at , difficulty breathing while lying down, fatigue, leg cramps with exertion, or lightheadedness. Respiratory: Denies chest discomfort, chest pain, pleuritic chest pain, cough, wheezing, or shortness of breath. GI: Denies abdominal pain, but still complains of heartburn and gas occasionally in the evening in spite of treatment. Denies loss of appetite, nausea, vomiting, or jaundice. GU: Negative MS: Denies joint pain, joint redness, joint swelling, or loss of strength. Skin: Negative Neuro: Denies difficulty in concentration disturbances, coordination, memory loss, numbness, poor balance, seizures, or loss of consciousness. Psych: Denies hallucinations, anxiety, depression, agitation, tearfulness, irritability.  Physical examination  General: Alert,  well-developed, well-nourished, well-hydrated. Head: Normocephalic, atraumatic no abnormalities observed, and no abnormalities palpated. Eyes: Vision grossly intact. Pupils, equal, pupils, round, and pupils reactive to light. Ears: Right ear normal left ear normal. Mouth: Good dentition no gingival abnormalities. No dental plaque and pharynx, pink and moist. Neck: Supple, full range of motion no masses. Lungs: Normal respiratory effort normal breath sounds. Heart: Normal rate regular rhythm. No murmur no gallop no rub. Abdomen: Soft, nontender, normal bowel sounds. No hepatomegaly no splenomegaly. MSK: Normal range of motion. No joint tenderness or joint swelling noted. Extremities: No clubbing, no edema. No cyanosis noted. Good pulses to all extremities. Neuro: Cranial nerves intact. No deficits noted. Station and gait are normal. Motor and coordination functions appear intact. Skin: Intact without suspicious lesions or rashes. Psych: Cognition and judgment appear intact. Alert and cooperative with normal attention span, concentration. No apparent delusions, illusions, hallucinations.  Assessment and plan  1. HIV. From the labs that were drawn on 09/01/2010. Her CD4 count was 480 at 25% with a viral load of less than 20 copies per mL. Under normal circumstances, we would recommend continuing present management. However, due to the continued problems. She is currently experiencing with her reflux disease. We have opted to make a change in her HIV therapy. After some discussion, we have decided to change her Kaletra to Isentress 400 mg by mouth every 12 hours. Treatment regimen drug side effects limitations adverse drug reactions were all discussed with her in detail. Our plan is to determine whether or Kaletra has contributed to her GERD symptoms and to continue on a regimen that has fewer toxicities and side effects. We will plan on having her return to clinic in 4 weeks to repeat labs and followup  in 6 weeks. She verbally acknowledged this information and agreed with the plan.  2.  GERD. As outlined in problem #1 we will do the anti-retroviral treatment change to see if this is contributing to her problem and asked that she continue taking her Nexium as is prescribed and we will then determine at followup whether she has had some more improvement. Again, she verbally acknowledged this and agreed with the plan.

## 2010-10-06 ENCOUNTER — Ambulatory Visit (INDEPENDENT_AMBULATORY_CARE_PROVIDER_SITE_OTHER): Payer: Medicaid Other | Admitting: *Deleted

## 2010-10-06 ENCOUNTER — Other Ambulatory Visit (HOSPITAL_COMMUNITY)
Admission: RE | Admit: 2010-10-06 | Discharge: 2010-10-06 | Disposition: A | Discharge status: Home / Self Care | Payer: Medicaid Other | Source: Ambulatory Visit | Attending: Infectious Diseases | Admitting: Infectious Diseases

## 2010-10-06 DIAGNOSIS — Z124 Encounter for screening for malignant neoplasm of cervix: Secondary | ICD-10-CM | POA: Insufficient documentation

## 2010-10-06 DIAGNOSIS — Z01419 Encounter for gynecological examination (general) (routine) without abnormal findings: Secondary | ICD-10-CM | POA: Insufficient documentation

## 2010-10-18 ENCOUNTER — Other Ambulatory Visit: Payer: Self-pay | Admitting: *Deleted

## 2010-10-18 DIAGNOSIS — F32A Depression, unspecified: Secondary | ICD-10-CM

## 2010-10-18 DIAGNOSIS — F5104 Psychophysiologic insomnia: Secondary | ICD-10-CM

## 2010-10-18 DIAGNOSIS — F329 Major depressive disorder, single episode, unspecified: Secondary | ICD-10-CM

## 2010-10-18 MED ORDER — TRAZODONE HCL 50 MG PO TABS: 50.0000 mg | ORAL_TABLET | Freq: Every day | ORAL | Status: DC

## 2010-10-18 MED ORDER — TRAMADOL HCL 50 MG PO TABS: 50.0000 mg | ORAL_TABLET | Freq: Three times a day (TID) | ORAL | Status: DC | PRN

## 2010-10-26 ENCOUNTER — Other Ambulatory Visit: Payer: Self-pay | Admitting: Infectious Diseases

## 2010-10-26 ENCOUNTER — Other Ambulatory Visit (INDEPENDENT_AMBULATORY_CARE_PROVIDER_SITE_OTHER): Payer: Medicaid Other

## 2010-10-26 DIAGNOSIS — B20 Human immunodeficiency virus [HIV] disease: Secondary | ICD-10-CM

## 2010-10-26 LAB — CBC WITH DIFFERENTIAL/PLATELET
Basophils Absolute: 0 10*3/uL (ref 0.0–0.1)
Basophils Relative: 0 % (ref 0–1)
Eosinophils Absolute: 0.1 10*3/uL (ref 0.0–0.7)
Eosinophils Relative: 1 % (ref 0–5)
HCT: 37.3 % (ref 36.0–46.0)
Hemoglobin: 12.3 g/dL (ref 12.0–15.0)
Lymphocytes Relative: 28 % (ref 12–46)
Lymphs Abs: 2.1 10*3/uL (ref 0.7–4.0)
MCH: 31.4 pg (ref 26.0–34.0)
MCHC: 33 g/dL (ref 30.0–36.0)
MCV: 95.2 fL (ref 78.0–100.0)
Monocytes Absolute: 0.3 10*3/uL (ref 0.1–1.0)
Monocytes Relative: 4 % (ref 3–12)
Neutro Abs: 5.1 10*3/uL (ref 1.7–7.7)
Neutrophils Relative %: 67 % (ref 43–77)
Platelets: 266 10*3/uL (ref 150–400)
RBC: 3.92 MIL/uL (ref 3.87–5.11)
RDW: 12.7 % (ref 11.5–15.5)
WBC: 7.6 10*3/uL (ref 4.0–10.5)

## 2010-10-26 LAB — COMPREHENSIVE METABOLIC PANEL
ALT: 39 U/L — ABNORMAL HIGH (ref 0–35)
AST: 27 U/L (ref 0–37)
Albumin: 4.5 g/dL (ref 3.5–5.2)
Alkaline Phosphatase: 132 U/L — ABNORMAL HIGH (ref 39–117)
BUN: 16 mg/dL (ref 6–23)
CO2: 25 mEq/L (ref 19–32)
Calcium: 9.8 mg/dL (ref 8.4–10.5)
Chloride: 105 mEq/L (ref 96–112)
Creat: 0.81 mg/dL (ref 0.40–1.20)
Glucose, Bld: 99 mg/dL (ref 70–99)
Potassium: 4.4 mEq/L (ref 3.5–5.3)
Sodium: 141 mEq/L (ref 135–145)
Total Bilirubin: 0.3 mg/dL (ref 0.3–1.2)
Total Protein: 7.9 g/dL (ref 6.0–8.3)

## 2010-10-27 LAB — T-HELPER CELL (CD4) - (RCID CLINIC ONLY)
CD4 % Helper T Cell: 28 % — ABNORMAL LOW (ref 33–55)
CD4 T Cell Abs: 580 uL (ref 400–2700)

## 2010-10-27 LAB — HIV-1 RNA QUANT-NO REFLEX-BLD
HIV 1 RNA Quant: 35 copies/mL — ABNORMAL HIGH (ref <20)
HIV-1 RNA Quant, Log: 1.54 {Log} — ABNORMAL HIGH (ref <1.30)

## 2010-11-02 ENCOUNTER — Encounter: Payer: Self-pay | Admitting: *Deleted

## 2010-11-09 ENCOUNTER — Ambulatory Visit: Payer: Medicaid Other | Admitting: Adult Health

## 2010-11-17 ENCOUNTER — Encounter: Payer: Self-pay | Admitting: Adult Health

## 2010-11-17 ENCOUNTER — Ambulatory Visit (INDEPENDENT_AMBULATORY_CARE_PROVIDER_SITE_OTHER): Payer: Medicaid Other | Admitting: Adult Health

## 2010-11-17 DIAGNOSIS — B20 Human immunodeficiency virus [HIV] disease: Secondary | ICD-10-CM

## 2010-11-17 DIAGNOSIS — Z79899 Other long term (current) drug therapy: Secondary | ICD-10-CM

## 2010-11-17 DIAGNOSIS — N951 Menopausal and female climacteric states: Secondary | ICD-10-CM

## 2010-11-17 MED ORDER — BLACK COHOSH 540 MG PO CAPS: ORAL_CAPSULE | ORAL | Status: DC

## 2010-11-17 NOTE — Patient Instructions (Signed)
Menopause Menopause is the normal time of life when menstrual periods stop completely. Menopause is complete when you have missed 12 consecutive menstrual periods. It usually occurs between the ages of 29-55, with an average age of 43. Very rarely does a woman develop menopause before 47 years old. At menopause, your ovaries stop producing the female hormones, estrogen and progesterone. This can cause undesirable symptoms and also affect your health. Sometimes the symptoms may occur 4-5 years before the menopause begins. There is no relationship between oral contraceptives, number of children you had, race or the age your menstrual periods started (menarche). Heavy smokers and very thin women may develop menopause earlier in life. CAUSE  The ovaries stop producing the female hormones estrogen and progesterone.   Other causes include:   Surgery to remove both ovaries.   The ovaries stop functioning for no known reason.     Tumors of the pituitary gland in the brain.      Medical disease that affects the ovaries and hormone production.   Radiation treatment to the abdomen or pelvis.     Chemotherapy that affects the ovaries.      SYMPTOMS  Hot flashes.   Night sweats.     Decrease in sex drive.     Vaginal dryness and shrinking of the size of the genital organs causing painful intercourse.     Dryness of the skin and developing wrinkles.   Headaches.    Tiredness.    Irritability.    Memory problems.   Weight gain.     Bladder infections.     Hair growth of the face and chest.     Infertility.    More serious symptoms include:  Loss of bone (osteoporosis) causing breaks (fractures).   Depression.   Hardening and narrowing of the arteries (atherosclerosis) causing heart attacks and strokes.  DIAGNOSIS  When the menstrual periods have stopped for 12 straight months.   Physical exam.   Hormone studies of the blood.  TREATMENT There are many treatment choices and  nearly as many questions about them. The decisions to treat or not to treat menopausal changes is an individual decision made with your caregiver. Your caregiver can discuss the treatments with you. Together, you can decide which treatment will work best for you such as:  Hormone replacement treatment.   Treat the individual symptoms with medication (ex. tranquilizer for depression).     Herbal medications that may help specific symptoms.   Counseling by a psychiatrist or psychologist.     Group therapy.     No treatment.     HOME CARE INSTRUCTIONS  Take the medication your caregiver gives you as directed.   Get plenty of sleep and rest.   Exercise regularly.   Eat a diet that contains calcium (good for the bones) and soy products (acts like estrogen hormone).   Avoid alcoholic beverages.   Do not smoke.   Taking vitamin E may help in certain cases.   If you have hot flashes, dress in layers.   Take supplements, calcium and vitamin D to strengthen bones.   You can use over-the-counter vaginal cream for vaginal dryness.   Group therapy is sometimes very helpful.   Acupuncture may be helpful in some cases.  SEEK MEDICAL CARE IF:  You are not sure you are in the menopause.   You are having menopausal symptoms and need advice and treatment.   You are still having menstrual periods after age 51.   You  have pain with intercourse.   You are in the menopause (no menstrual period for 12 months) and develop vaginal bleeding.   You need a referral to a specialist (gynecologist, psychiatrist or psychologist) for treatment.  SEEK IMMEDIATE MEDICAL CARE IF:  You have severe depression.   You have excessive vaginal bleeding.   You fell and think you have a broken bone.   You have pain when you urinate.   You develop leg or chest pain.   You have a fast pounding heart beat (palpitations).   You have severe headaches.   You develop vision problems.   You feel a lump  in your breast.   You have abdominal pain or severe indigestion.  Document Released: 08/18/2003 Document Re-Released: 03/30/2008 [facility] Patient Information 2011 [place], [place].Menopause and Herbal Products Menopause is the normal time of life when menstrual periods stop completely. Menopause is complete when you have missed 12 consecutive menstrual periods. It usually occurs between the ages of 40-55, with an average age of 6. Very rarely does a woman develop menopause before 47 years old. At menopause, your ovaries stop producing the female hormones, estrogen and progesterone. This can cause undesirable symptoms and also affect your health. Sometimes the symptoms may occur 4-5 years before the menopause begins. There is no relationship between oral contraceptives, number of children you had, race or the age your menstrual periods started (menarche). Heavy smokers and very thin women may develop menopause earlier in life. Estrogen and progesterone hormone treatment is the usual method of treating menopausal symptoms. However, there are women who should not take hormone treatment. This is true of:    Women that have breast or uterine cancer.   Women who prefer not to take hormones because of certain side effects (abnormal uterine bleeding).   Women who are afraid that hormones may cause breast cancer.  For these women, there are other medications that may help treat their menopausal symptoms. These medications are found in plants and botanical products. They can be found in the form of herbs, teas, oils, tinctures and pills.   CAUSES OF MENOPAUSE:  The ovaries stop producing the female hormones estrogen and progesterone.   Other causes include:   Surgery to remove both ovaries.   The ovaries stop functioning for no known reason.     Tumors of the pituitary gland in the brain.      Medical disease that affects the ovaries and hormone production.   Radiation treatment to the abdomen or  pelvis.     Chemotherapy that affects the ovaries.      PHYTOESTROGENS: Phytoestrogen's occur naturally in plants and plant products. They act like estrogen in the body. Herbal medications are made from these plants and botanical steroids. There are three types of phytoestrogens:  Isoflavones (genistein and daidzein) are found in soy, garbanzo beans, miso and tofu foods.   Ligins are found in the shell of seeds. They are used to make oils like flaxseed oil. The bacteria in your intestine act on these foods to produce the estrogen-like hormones.   Coumestans are more estrogen-like for animals than humans. They are found in sunflower seeds and bean sprouts.  TREATMENT WITH HERBAL MEDICATIONS FOR:  Hot flashes and night sweats.   Soy, black cohosh and evening primrose.   Irritability, insomnia, depression and memory problems.   Casteberry, ginseng, and soy.   St. John's wort may be helpful for depression. However, there is a concern of it causing cataracts of the eye and  may have bad effects on other medications. St. John's wort should not be taken for long time and without your caregiver's advice.   Loss of libido and vaginal and skin dryness.   Wild yam and soy.   Prevention of coronary heart disease and osteoporosis.   Soy and Isoflavones.  Other forms of treatment to help women with menopausal symptoms include a balanced diet, rest, exercise, vitamin and calcium (with vitamin D) supplements, acupuncture and group therapy when necessary. WOMEN WHO SHOULD NOT TAKE HERBAL MEDICATIONS:  Infants, children and elderly women unless told by your caregiver.   Women who are planning on getting pregnant unless told by your caregiver.   Women who are breastfeeding unless told by your caregiver.   Women who are taking other prescription medications unless told by your caregiver.  Different herbal medications have different and unmeasured amounts of the herbal ingredients. There are no  regulations, quality control and standardization of the ingredients in herbal medications. Therefore, the amount of the ingredient in the medication may vary from one herb, pill, tea, oil or tincture to another. Many herbal medications can cause serious problems and can even have poisonous effects if taken too much or too long. If problems develop, the medication should be stopped and recorded by your caregiver. HOME CARE INSTRUCTIONS  Do not take or give children herbal medications without your caregiver's advice.   Let your caregiver know all the medications you are taking. This includes prescription, over-the-counter, eye drops and creams.   Do not take herbal medications longer or more than recommended.   Tell your caregiver about any side effects from the medication.  SEEK MEDICAL CARE IF:  You develop a fever of 101, or as directed by your caregiver.   You feel sick to your stomach (nauseous), vomit or have diarrhea.   You develop a rash.   You develop abdominal pain.   You develop severe headaches.   You start to have vision problems.   You feel dizzy or faint.   You start to feel numbness in any part of your body.   You start shaking (have convulsions).  Document Released: 11/14/2007 Document Re-Released: 11/15/2009 [facility] Patient Information 2011 [place], [place].

## 2010-12-04 ENCOUNTER — Other Ambulatory Visit: Payer: Self-pay | Admitting: *Deleted

## 2010-12-04 ENCOUNTER — Other Ambulatory Visit: Payer: Self-pay | Admitting: Adult Health

## 2010-12-04 DIAGNOSIS — Z1231 Encounter for screening mammogram for malignant neoplasm of breast: Secondary | ICD-10-CM

## 2010-12-04 DIAGNOSIS — K219 Gastro-esophageal reflux disease without esophagitis: Secondary | ICD-10-CM

## 2010-12-04 MED ORDER — ESOMEPRAZOLE MAGNESIUM 40 MG PO CPDR: 40.0000 mg | DELAYED_RELEASE_CAPSULE | Freq: Every day | ORAL | Status: DC

## 2010-12-12 ENCOUNTER — Ambulatory Visit
Admission: RE | Admit: 2010-12-12 | Discharge: 2010-12-12 | Disposition: A | Discharge status: Home / Self Care | Payer: Medicaid Other | Source: Ambulatory Visit | Attending: Adult Health | Admitting: Adult Health

## 2010-12-12 ENCOUNTER — Ambulatory Visit: Payer: Medicaid Other

## 2010-12-12 DIAGNOSIS — Z1231 Encounter for screening mammogram for malignant neoplasm of breast: Secondary | ICD-10-CM

## 2011-01-08 ENCOUNTER — Other Ambulatory Visit: Payer: Self-pay | Admitting: Adult Health

## 2011-01-09 ENCOUNTER — Other Ambulatory Visit: Payer: Self-pay | Admitting: *Deleted

## 2011-01-09 DIAGNOSIS — K219 Gastro-esophageal reflux disease without esophagitis: Secondary | ICD-10-CM

## 2011-01-09 MED ORDER — ESOMEPRAZOLE MAGNESIUM 40 MG PO CPDR: 40.0000 mg | DELAYED_RELEASE_CAPSULE | Freq: Every day | ORAL | Status: DC

## 2011-02-14 ENCOUNTER — Other Ambulatory Visit: Payer: Self-pay | Admitting: Infectious Diseases

## 2011-02-14 ENCOUNTER — Other Ambulatory Visit (INDEPENDENT_AMBULATORY_CARE_PROVIDER_SITE_OTHER): Payer: Medicaid Other

## 2011-02-14 DIAGNOSIS — Z79899 Other long term (current) drug therapy: Secondary | ICD-10-CM

## 2011-02-14 DIAGNOSIS — B20 Human immunodeficiency virus [HIV] disease: Secondary | ICD-10-CM

## 2011-02-15 LAB — COMPLETE METABOLIC PANEL WITH GFR
ALT: 37 U/L — ABNORMAL HIGH (ref 0–35)
AST: 28 U/L (ref 0–37)
Albumin: 4.2 g/dL (ref 3.5–5.2)
Alkaline Phosphatase: 128 U/L — ABNORMAL HIGH (ref 39–117)
BUN: 11 mg/dL (ref 6–23)
CO2: 23 mEq/L (ref 19–32)
Calcium: 9.3 mg/dL (ref 8.4–10.5)
Chloride: 105 mEq/L (ref 96–112)
Creat: 0.8 mg/dL (ref 0.50–1.10)
GFR, Est African American: >60 mL/min (ref >60)
GFR, Est Non African American: >60 mL/min (ref >60)
Glucose, Bld: 80 mg/dL (ref 70–99)
Potassium: 4 mEq/L (ref 3.5–5.3)
Sodium: 140 mEq/L (ref 135–145)
Total Bilirubin: 0.4 mg/dL (ref 0.3–1.2)
Total Protein: 7.3 g/dL (ref 6.0–8.3)

## 2011-02-15 LAB — LIPID PANEL
Cholesterol: 183 mg/dL (ref 0–200)
HDL: 35 mg/dL — ABNORMAL LOW (ref >39)
LDL Cholesterol: 102 mg/dL — ABNORMAL HIGH (ref 0–99)
Total CHOL/HDL Ratio: 5.2 Ratio
Triglycerides: 232 mg/dL — ABNORMAL HIGH (ref <150)
VLDL: 46 mg/dL — ABNORMAL HIGH (ref 0–40)

## 2011-02-15 LAB — CBC WITH DIFFERENTIAL/PLATELET
Basophils Absolute: 0 10*3/uL (ref 0.0–0.1)
Basophils Relative: 0 % (ref 0–1)
Eosinophils Absolute: 0.1 10*3/uL (ref 0.0–0.7)
Eosinophils Relative: 2 % (ref 0–5)
HCT: 33.4 % — ABNORMAL LOW (ref 36.0–46.0)
Hemoglobin: 11 g/dL — ABNORMAL LOW (ref 12.0–15.0)
Lymphocytes Relative: 33 % (ref 12–46)
Lymphs Abs: 2.3 10*3/uL (ref 0.7–4.0)
MCH: 31.4 pg (ref 26.0–34.0)
MCHC: 32.9 g/dL (ref 30.0–36.0)
MCV: 95.4 fL (ref 78.0–100.0)
Monocytes Absolute: 0.5 10*3/uL (ref 0.1–1.0)
Monocytes Relative: 6 % (ref 3–12)
Neutro Abs: 4.2 10*3/uL (ref 1.7–7.7)
Neutrophils Relative %: 59 % (ref 43–77)
Platelets: 251 10*3/uL (ref 150–400)
RBC: 3.5 MIL/uL — ABNORMAL LOW (ref 3.87–5.11)
RDW: 12.8 % (ref 11.5–15.5)
WBC: 7.1 10*3/uL (ref 4.0–10.5)

## 2011-02-15 LAB — T-HELPER CELL (CD4) - (RCID CLINIC ONLY)
CD4 % Helper T Cell: 29 % — ABNORMAL LOW (ref 33–55)
CD4 T Cell Abs: 720 uL (ref 400–2700)

## 2011-02-16 LAB — HIV-1 RNA QUANT-NO REFLEX-BLD
HIV 1 RNA Quant: <20 copies/mL (ref <20)
HIV-1 RNA Quant, Log: <1.3 {Log} (ref <1.30)

## 2011-02-28 ENCOUNTER — Ambulatory Visit (INDEPENDENT_AMBULATORY_CARE_PROVIDER_SITE_OTHER): Payer: Medicaid Other | Admitting: Adult Health

## 2011-02-28 ENCOUNTER — Encounter: Payer: Self-pay | Admitting: Adult Health

## 2011-02-28 VITALS — BP 117/77 | HR 96 | Temp 97.7°F | Ht 62.0 in | Wt 171.0 lb

## 2011-02-28 DIAGNOSIS — Z23 Encounter for immunization: Secondary | ICD-10-CM

## 2011-02-28 DIAGNOSIS — B20 Human immunodeficiency virus [HIV] disease: Secondary | ICD-10-CM

## 2011-02-28 NOTE — Patient Instructions (Signed)
1. Schedule a followup visit for 4 months. 2. Come to clinic 2 weeks before next appointment to have labs drawn. 3. Be sure to fast 10 hours before your next blood draw.

## 2011-03-09 ENCOUNTER — Emergency Department (HOSPITAL_COMMUNITY): Payer: Medicaid Other

## 2011-03-09 ENCOUNTER — Emergency Department (HOSPITAL_COMMUNITY)
Admission: EM | Admit: 2011-03-09 | Discharge: 2011-03-09 | Disposition: A | Discharge status: Home / Self Care | Payer: Medicaid Other | Attending: Emergency Medicine | Admitting: Emergency Medicine

## 2011-03-09 DIAGNOSIS — T148XXA Other injury of unspecified body region, initial encounter: Secondary | ICD-10-CM | POA: Insufficient documentation

## 2011-03-09 DIAGNOSIS — M79609 Pain in unspecified limb: Secondary | ICD-10-CM | POA: Insufficient documentation

## 2011-03-09 DIAGNOSIS — M545 Low back pain, unspecified: Secondary | ICD-10-CM | POA: Insufficient documentation

## 2011-03-09 DIAGNOSIS — Z21 Asymptomatic human immunodeficiency virus [HIV] infection status: Secondary | ICD-10-CM | POA: Insufficient documentation

## 2011-03-16 LAB — POCT CARDIAC MARKERS
CKMB, poc: 1.3 ng/mL (ref 1.0–8.0)
CKMB, poc: <1 ng/mL — ABNORMAL LOW (ref 1.0–8.0)
Myoglobin, poc: 51.2 ng/mL (ref 12–200)
Myoglobin, poc: 75.6 ng/mL (ref 12–200)
Troponin i, poc: <0.05 ng/mL (ref 0.00–0.09)
Troponin i, poc: <0.05 ng/mL (ref 0.00–0.09)

## 2011-03-16 LAB — BASIC METABOLIC PANEL
BUN: 16 mg/dL (ref 6–23)
CO2: 26 mEq/L (ref 19–32)
Calcium: 8.9 mg/dL (ref 8.4–10.5)
Chloride: 103 mEq/L (ref 96–112)
Creatinine, Ser: 0.77 mg/dL (ref 0.4–1.2)
GFR calc Af Amer: >60 mL/min (ref >60)
GFR calc non Af Amer: >60 mL/min (ref >60)
Glucose, Bld: 91 mg/dL (ref 70–99)
Potassium: 3.8 mEq/L (ref 3.5–5.1)
Sodium: 137 mEq/L (ref 135–145)

## 2011-03-16 LAB — DIFFERENTIAL
Basophils Absolute: 0 10*3/uL (ref 0.0–0.1)
Basophils Relative: 1 % (ref 0–1)
Eosinophils Absolute: 0.2 10*3/uL (ref 0.0–0.7)
Eosinophils Relative: 3 % (ref 0–5)
Lymphocytes Relative: 20 % (ref 12–46)
Lymphs Abs: 1.6 10*3/uL (ref 0.7–4.0)
Monocytes Absolute: 0.7 10*3/uL (ref 0.1–1.0)
Monocytes Relative: 8 % (ref 3–12)
Neutro Abs: 5.5 10*3/uL (ref 1.7–7.7)
Neutrophils Relative %: 69 % (ref 43–77)

## 2011-03-16 LAB — URINALYSIS, ROUTINE W REFLEX MICROSCOPIC
Bilirubin Urine: NEGATIVE
Glucose, UA: NEGATIVE mg/dL
Ketones, ur: NEGATIVE mg/dL
Nitrite: NEGATIVE
Protein, ur: NEGATIVE mg/dL
Specific Gravity, Urine: 1.026 (ref 1.005–1.030)
Urobilinogen, UA: 0.2 mg/dL (ref 0.0–1.0)
pH: 5.5 (ref 5.0–8.0)

## 2011-03-16 LAB — URINE CULTURE: Colony Count: 4000

## 2011-03-16 LAB — CBC
HCT: 30.5 % — ABNORMAL LOW (ref 36.0–46.0)
Hemoglobin: 10.3 g/dL — ABNORMAL LOW (ref 12.0–15.0)
MCHC: 33.6 g/dL (ref 30.0–36.0)
MCV: 95.5 fL (ref 78.0–100.0)
Platelets: 261 10*3/uL (ref 150–400)
RBC: 3.19 MIL/uL — ABNORMAL LOW (ref 3.87–5.11)
RDW: 11.9 % (ref 11.5–15.5)
WBC: 8 10*3/uL (ref 4.0–10.5)

## 2011-03-16 LAB — D-DIMER, QUANTITATIVE: D-Dimer, Quant: 0.71 ug/mL-FEU — ABNORMAL HIGH (ref 0.00–0.48)

## 2011-03-16 LAB — URINE MICROSCOPIC-ADD ON

## 2011-03-16 LAB — RAPID URINE DRUG SCREEN, HOSP PERFORMED
Amphetamines: NOT DETECTED
Barbiturates: NOT DETECTED
Benzodiazepines: NOT DETECTED
Cocaine: NOT DETECTED
Opiates: NOT DETECTED
Tetrahydrocannabinol: NOT DETECTED

## 2011-03-16 LAB — POCT PREGNANCY, URINE: Preg Test, Ur: NEGATIVE

## 2011-03-16 LAB — ETHANOL: Alcohol, Ethyl (B): 5 mg/dL (ref 0–10)

## 2011-03-22 ENCOUNTER — Other Ambulatory Visit: Payer: Self-pay | Admitting: *Deleted

## 2011-03-22 MED ORDER — LORATADINE 10 MG PO TABS: 10.0000 mg | ORAL_TABLET | Freq: Every day | ORAL | Status: DC

## 2011-04-18 ENCOUNTER — Other Ambulatory Visit: Payer: Self-pay | Admitting: *Deleted

## 2011-04-18 DIAGNOSIS — G4700 Insomnia, unspecified: Secondary | ICD-10-CM

## 2011-04-18 DIAGNOSIS — R52 Pain, unspecified: Secondary | ICD-10-CM

## 2011-04-18 MED ORDER — TRAMADOL HCL 50 MG PO TABS: 50.0000 mg | ORAL_TABLET | Freq: Three times a day (TID) | ORAL | Status: DC | PRN

## 2011-04-18 MED ORDER — TRAZODONE HCL 50 MG PO TABS: 50.0000 mg | ORAL_TABLET | Freq: Every day | ORAL | Status: DC

## 2011-05-09 ENCOUNTER — Other Ambulatory Visit: Payer: Self-pay | Admitting: Infectious Diseases

## 2011-05-09 DIAGNOSIS — B20 Human immunodeficiency virus [HIV] disease: Secondary | ICD-10-CM

## 2011-05-11 ENCOUNTER — Other Ambulatory Visit: Payer: Self-pay | Admitting: *Deleted

## 2011-05-11 NOTE — Telephone Encounter (Signed)
Too early to refill.  Pharmacy will refax closer to refill date.

## 2011-05-15 ENCOUNTER — Other Ambulatory Visit: Payer: Self-pay | Admitting: Infectious Diseases

## 2011-05-15 ENCOUNTER — Other Ambulatory Visit: Payer: No Typology Code available for payment source

## 2011-05-15 ENCOUNTER — Other Ambulatory Visit: Payer: Self-pay | Admitting: Licensed Clinical Social Worker

## 2011-05-15 ENCOUNTER — Other Ambulatory Visit: Payer: Medicaid Other

## 2011-05-15 ENCOUNTER — Telehealth: Payer: Self-pay | Admitting: Licensed Clinical Social Worker

## 2011-05-15 DIAGNOSIS — K219 Gastro-esophageal reflux disease without esophagitis: Secondary | ICD-10-CM

## 2011-05-15 DIAGNOSIS — B20 Human immunodeficiency virus [HIV] disease: Secondary | ICD-10-CM

## 2011-05-15 LAB — CBC WITH DIFFERENTIAL/PLATELET
Basophils Absolute: 0 10*3/uL (ref 0.0–0.1)
Basophils Relative: 0 % (ref 0–1)
Eosinophils Absolute: 0.1 10*3/uL (ref 0.0–0.7)
Eosinophils Relative: 1 % (ref 0–5)
HCT: 34.9 % — ABNORMAL LOW (ref 36.0–46.0)
Hemoglobin: 11.8 g/dL — ABNORMAL LOW (ref 12.0–15.0)
Lymphocytes Relative: 38 % (ref 12–46)
Lymphs Abs: 2.9 10*3/uL (ref 0.7–4.0)
MCH: 31.3 pg (ref 26.0–34.0)
MCHC: 33.8 g/dL (ref 30.0–36.0)
MCV: 92.6 fL (ref 78.0–100.0)
Monocytes Absolute: 0.5 10*3/uL (ref 0.1–1.0)
Monocytes Relative: 7 % (ref 3–12)
Neutro Abs: 4.1 10*3/uL (ref 1.7–7.7)
Neutrophils Relative %: 54 % (ref 43–77)
Platelets: 252 10*3/uL (ref 150–400)
RBC: 3.77 MIL/uL — ABNORMAL LOW (ref 3.87–5.11)
RDW: 11.9 % (ref 11.5–15.5)
WBC: 7.6 10*3/uL (ref 4.0–10.5)

## 2011-05-15 LAB — COMPREHENSIVE METABOLIC PANEL
ALT: 26 U/L (ref 0–35)
AST: 24 U/L (ref 0–37)
Albumin: 4.3 g/dL (ref 3.5–5.2)
Alkaline Phosphatase: 123 U/L — ABNORMAL HIGH (ref 39–117)
BUN: 14 mg/dL (ref 6–23)
CO2: 28 mEq/L (ref 19–32)
Calcium: 9.4 mg/dL (ref 8.4–10.5)
Chloride: 107 mEq/L (ref 96–112)
Creat: 0.9 mg/dL (ref 0.50–1.10)
Glucose, Bld: 81 mg/dL (ref 70–99)
Potassium: 4.1 mEq/L (ref 3.5–5.3)
Sodium: 142 mEq/L (ref 135–145)
Total Bilirubin: 0.4 mg/dL (ref 0.3–1.2)
Total Protein: 7.5 g/dL (ref 6.0–8.3)

## 2011-05-15 MED ORDER — PANTOPRAZOLE SODIUM 40 MG PO TBEC: 40.0000 mg | DELAYED_RELEASE_TABLET | Freq: Every day | ORAL | Status: DC

## 2011-05-15 NOTE — Telephone Encounter (Signed)
Pharmacy called stating that medicaid will no longer cover Nexium. She will need to try a medication that is covered under medicaid formulary. Dr. Ninetta Browning suggested Protonix 40mg  to try, patient is aware and agrees to try this.

## 2011-05-15 NOTE — Telephone Encounter (Signed)
I reviewed her chart. Her BMI is over 31. Told pt she was doing well with weight & no longer needed it.  Stated she was glad to hear it

## 2011-05-16 LAB — T-HELPER CELL (CD4) - (RCID CLINIC ONLY)
CD4 % Helper T Cell: 25 % — ABNORMAL LOW (ref 33–55)
CD4 T Cell Abs: 720 uL (ref 400–2700)

## 2011-05-17 LAB — HIV-1 RNA QUANT-NO REFLEX-BLD
HIV 1 RNA Quant: <20 copies/mL (ref <20)
HIV-1 RNA Quant, Log: <1.3 {Log} (ref <1.30)

## 2011-05-18 ENCOUNTER — Other Ambulatory Visit: Payer: Self-pay | Admitting: *Deleted

## 2011-05-18 DIAGNOSIS — R52 Pain, unspecified: Secondary | ICD-10-CM

## 2011-05-18 MED ORDER — TRAMADOL HCL 50 MG PO TABS: 50.0000 mg | ORAL_TABLET | Freq: Three times a day (TID) | ORAL | Status: DC | PRN

## 2011-05-30 ENCOUNTER — Encounter (HOSPITAL_COMMUNITY): Payer: Self-pay | Admitting: *Deleted

## 2011-05-30 ENCOUNTER — Emergency Department (HOSPITAL_COMMUNITY)
Admission: EM | Admit: 2011-05-30 | Discharge: 2011-05-30 | Disposition: A | Discharge status: Home / Self Care | Payer: Medicaid Other | Attending: Emergency Medicine | Admitting: Emergency Medicine

## 2011-05-30 ENCOUNTER — Emergency Department (HOSPITAL_COMMUNITY): Payer: Medicaid Other

## 2011-05-30 DIAGNOSIS — J069 Acute upper respiratory infection, unspecified: Secondary | ICD-10-CM | POA: Insufficient documentation

## 2011-05-30 DIAGNOSIS — Z21 Asymptomatic human immunodeficiency virus [HIV] infection status: Secondary | ICD-10-CM | POA: Insufficient documentation

## 2011-05-30 DIAGNOSIS — R05 Cough: Secondary | ICD-10-CM | POA: Insufficient documentation

## 2011-05-30 DIAGNOSIS — R059 Cough, unspecified: Secondary | ICD-10-CM | POA: Insufficient documentation

## 2011-05-30 DIAGNOSIS — J189 Pneumonia, unspecified organism: Secondary | ICD-10-CM | POA: Insufficient documentation

## 2011-05-30 DIAGNOSIS — R0602 Shortness of breath: Secondary | ICD-10-CM | POA: Insufficient documentation

## 2011-05-30 DIAGNOSIS — F17200 Nicotine dependence, unspecified, uncomplicated: Secondary | ICD-10-CM | POA: Insufficient documentation

## 2011-05-30 HISTORY — DX: Human immunodeficiency virus (HIV) disease: B20

## 2011-05-30 HISTORY — DX: Asymptomatic human immunodeficiency virus (hiv) infection status: Z21

## 2011-05-30 MED ORDER — ALBUTEROL SULFATE (5 MG/ML) 0.5% IN NEBU
INHALATION_SOLUTION | RESPIRATORY_TRACT | Status: AC
  Administered 2011-05-30: 5 mg via RESPIRATORY_TRACT
  Filled 2011-05-30: qty 1

## 2011-05-30 MED ORDER — ONDANSETRON 4 MG PO TBDP
8.0000 mg | ORAL_TABLET | Freq: Once | ORAL | Status: AC
  Administered 2011-05-30: 8 mg via ORAL
  Filled 2011-05-30 (×2): qty 1

## 2011-05-30 MED ORDER — LEVOFLOXACIN 500 MG PO TABS: 500.0000 mg | ORAL_TABLET | Freq: Every day | ORAL | Status: AC

## 2011-05-30 MED ORDER — SODIUM CHLORIDE 0.9 % IV SOLN
Freq: Once | INTRAVENOUS | Status: AC
  Administered 2011-05-30: 10 mL/h via INTRAVENOUS

## 2011-05-30 MED ORDER — PREDNISONE 20 MG PO TABS
60.0000 mg | ORAL_TABLET | Freq: Once | ORAL | Status: AC
  Administered 2011-05-30: 60 mg via ORAL
  Filled 2011-05-30: qty 3

## 2011-05-30 MED ORDER — IOHEXOL 300 MG/ML  SOLN
100.0000 mL | Freq: Once | INTRAMUSCULAR | Status: AC | PRN
  Administered 2011-05-30: 100 mL via INTRAVENOUS

## 2011-05-30 MED ORDER — IPRATROPIUM BROMIDE 0.02 % IN SOLN
0.5000 mg | Freq: Once | RESPIRATORY_TRACT | Status: AC
  Administered 2011-05-30: 0.5 mg via RESPIRATORY_TRACT
  Filled 2011-05-30: qty 2.5

## 2011-05-30 MED ORDER — ALBUMIN HUMAN 5 % IV SOLN: 12.5000 g | Freq: Once | INTRAVENOUS | Status: DC

## 2011-05-30 MED ORDER — IBUPROFEN 800 MG PO TABS
800.0000 mg | ORAL_TABLET | Freq: Once | ORAL | Status: AC
  Administered 2011-05-30: 800 mg via ORAL
  Filled 2011-05-30: qty 1

## 2011-05-30 MED ORDER — LEVOFLOXACIN 250 MG PO TABS: 250.0000 mg | ORAL_TABLET | Freq: Every day | ORAL | Status: DC

## 2011-05-30 MED ORDER — LEVOFLOXACIN 500 MG PO TABS: 500.0000 mg | ORAL_TABLET | Freq: Every day | ORAL | Status: DC

## 2011-05-30 MED ORDER — ALBUTEROL SULFATE (5 MG/ML) 0.5% IN NEBU
5.0000 mg | INHALATION_SOLUTION | Freq: Once | RESPIRATORY_TRACT | Status: AC
  Administered 2011-05-30: 5 mg via RESPIRATORY_TRACT

## 2011-05-30 MED ORDER — ALBUTEROL SULFATE (5 MG/ML) 0.5% IN NEBU
5.0000 mg | INHALATION_SOLUTION | Freq: Once | RESPIRATORY_TRACT | Status: AC
  Administered 2011-05-30: 5 mg via RESPIRATORY_TRACT
  Filled 2011-05-30: qty 1

## 2011-05-30 MED ORDER — ALBUTEROL SULFATE HFA 108 (90 BASE) MCG/ACT IN AERS
2.0000 | INHALATION_SPRAY | RESPIRATORY_TRACT | Status: DC | PRN
  Administered 2011-05-30: 2 via RESPIRATORY_TRACT
  Filled 2011-05-30: qty 6.7

## 2011-05-30 NOTE — ED Notes (Signed)
States cough, sob, and generalized body aches x 1 week.

## 2011-05-30 NOTE — ED Notes (Signed)
Patient transported to CT 

## 2011-05-30 NOTE — ED Notes (Signed)
Patient reports she has been sick for 1week with body aches,  Joints are sore.  Patient states she has had headache. She has cough, short of breath.  Patient has thick yellow production with her cough

## 2011-05-30 NOTE — ED Notes (Signed)
Returned from ct 

## 2011-05-30 NOTE — ED Notes (Signed)
Contacted ct x2. Staff states someone in route to get pt for scan.

## 2011-05-30 NOTE — ED Provider Notes (Signed)
History     [name]: [id number] Arrival date & time: 05/30/2011  9:48 AM   First MD Initiated Contact with Patient 05/30/11 1008      Chief Complaint  Patient presents with  . URI  . Shortness of Breath    (Consider location/radiation/quality/duration/timing/severity/associated sxs/prior treatment) Patient is a 45 y.o. female presenting with cough. The history is provided by the patient. No language interpreter was used.  Cough This is a new problem. The current episode started more than 1 week ago. The problem occurs every few minutes. The problem has been gradually worsening. The cough is productive of sputum. There has been no fever. The fever has been present for 5 days or more. Associated symptoms include chills, sweats, rhinorrhea, shortness of breath and wheezing. Pertinent negatives include no chest pain, no weight loss, no ear pain, no headaches and no sore throat. She has tried decongestants for the symptoms. The treatment provided no relief. She is a smoker.   Patient reports she has been sick for a week with body aches and upper respiratory symptoms. Her cough has gotten worse in the last 24 hours and she hasn't had chills with shortness of breath. She is a patient of the infectious disease clinic. Denies fever but states that she has had some chills. Wheezing upon auscultation today in the ER. Will start with breathing treatments and a chest x-ray. She is a heavy smoker.  Past Medical History  Diagnosis Date  . HIV (human immunodeficiency virus infection)     Past Surgical History  Procedure Date  . Appendectomy   . Cholecystectomy   . Tonsillectomy     No family history on file.  History  Substance Use Topics  . Smoking status: Current Everyday Smoker -- 3.0 packs/day    Types: Cigarettes  . Smokeless tobacco: Never Used  . Alcohol Use: No    OB History    Grav Para Term Preterm Abortions TAB SAB Ect Mult Living                  Review of Systems    Constitutional: Positive for chills. Negative for weight loss.  HENT: Positive for rhinorrhea. Negative for ear pain and sore throat.   Respiratory: Positive for cough, shortness of breath and wheezing.   Cardiovascular: Negative for chest pain.  Neurological: Negative for headaches.  All other systems reviewed and are negative.    Allergies  Review of patient's allergies indicates no known allergies.  Home Medications   Current Outpatient Rx  Name Route Sig Dispense Refill  . BLACK COHOSH 540 MG PO CAPS  One capsule or tablet by mouth daily for symptoms of menopause 30 capsule 12  . EMTRICITABINE-TENOFOVIR 200-300 MG PO TABS Oral Take 1 tablet by mouth daily.      Marland Kitchen GABAPENTIN 300 MG PO CAPS Oral Take 1 capsule (300 mg total) by mouth 3 (three) times daily. 90 capsule 5  . LORATADINE 10 MG PO TABS Oral Take 1 tablet (10 mg total) by mouth daily. 30 tablet 6  . PANTOPRAZOLE SODIUM 40 MG PO TBEC Oral Take 1 tablet (40 mg total) by mouth daily. 30 tablet 3  . RALTEGRAVIR POTASSIUM 400 MG PO TABS Oral Take 1 tablet (400 mg total) by mouth 2 (two) times daily. 60 tablet 11  . TRAMADOL HCL 50 MG PO TABS Oral Take 1 tablet (50 mg total) by mouth every 8 (eight) hours as needed for pain. Maximum dose= 8 tablets per day 90 tablet  0    Last filled on 7/25, next fill 8/24  . TRAZODONE HCL 50 MG PO TABS Oral Take 1 tablet (50 mg total) by mouth at bedtime. 30 tablet 1    last filled on 7/25, next fill 8/24  . ENSURE PO LIQD Oral Take 1 Can by mouth 2 (two) times daily between meals.      Marland Kitchen LEVOFLOXACIN 250 MG PO TABS Oral Take 1 tablet (250 mg total) by mouth daily. 7 tablet 0    BP 94/55  Pulse 83  Temp(Src) 97.8 F (36.6 C) (Oral)  Resp 24  SpO2 98%  Physical Exam  Nursing note and vitals reviewed. Constitutional: She is oriented to person, place, and time. She appears well-developed and well-nourished.  HENT:  Head: Normocephalic and atraumatic.  Eyes: Pupils are equal, round, and  reactive to light.  Neck: Normal range of motion.  Cardiovascular: Normal rate.  Exam reveals no gallop.   No murmur heard. Pulmonary/Chest: No respiratory distress. She has wheezes. She exhibits no tenderness.       Rhonchi heard upon auscultation   Abdominal: Soft.  Musculoskeletal: Normal range of motion. She exhibits tenderness.       Body aches  Neurological: She is alert and oriented to person, place, and time.  Skin: Skin is warm and dry.  Psychiatric: She has a normal mood and affect.    ED Course  Procedures (including critical care time)  Labs Reviewed - No data to display Dg Chest 2 View  05/30/2011  *RADIOLOGY REPORT*  Clinical Data: Shortness of breath, cough and wheezing.  CHEST - 2 VIEW  Comparison: PA and lateral chest 08/22/2009.  Findings: There is new left hilar fullness seen on both the PA and lateral views.  No focal airspace disease identified.  Heart size is normal.  No pneumothorax.  No pleural effusion.  IMPRESSION: Left hilar fullness.  Chest CT with contrast recommend for further evaluation.  Original Report Authenticated By: Bernadene Bell. Maricela Curet, M.D.   Ct Chest W Contrast  05/30/2011  *RADIOLOGY REPORT*  Clinical Data: Wheezing, cough, fever, abnormal chest x-ray  CT CHEST WITH CONTRAST  Technique:  Multidetector CT imaging of the chest was performed following the standard protocol during bolus administration of intravenous contrast.  Contrast: OMNIPAQUE IOHEXOL 300 MG/ML IV SOLN  Comparison: Chest x-rays same day  Findings: Images of the thoracic inlet are unremarkable.  Sagittal images of the thoracic spine shows minimal degenerative changes mid thoracic spine.  No destructive bony lesions are noted.  Central airways are patent.  No mediastinal hematoma or adenopathy. Thoracic aorta is unremarkable.  No left hilar mass is identified.  In the axial image 32 there is consolidation with air bronchogram in the right lower lobe retro hilar region measures 3.6  cm.  This  is most likely due to focal pneumonia and is best visualized in sagittal image 56.  Follow-up to complete resolution after appropriate treatment is recommended.  The left lung is clear.  No pulmonary edema.  No pleural effusion. The visualized upper abdomen is unremarkable.  Heart size is within normal limits.  A left axillary lymph node measures 1.4 by 0.90 cm.  Right axillary lymph node measures 10 by 9 mm.  IMPRESSION:  1.  No left hilar mass is noted.  There is consolidation with air bronchogram in the right lower lobe retro hilar posteromedial. Measures at least 3.6 cm. Most likely due to focal pneumonia. Follow-up to complete resolution is recommended. 2.  No hilar adenopathy.  Bilateral borderline axillary lymph nodes. 3.  No pulmonary edema.  The left lung is clear.  Original Report Authenticated By: Natasha Mead, M.D.     1. URI (upper respiratory infection)   2. Pneumonia       MDM  Ms [name] is being treated for pneumonia today per CT chest results. She is an HIV-positive patients coming in with cough x2 days and body aches and pains x7 days. Patient was given prescription for Levaquin that she said stated that she would get filled today. She's also given an albuterol inhaler and incentive spirometer with directions for use. She will followup tomorrow at the infectious disease in clinic. Return to the ER for uncontrolled nausea and vomiting or severe SOB.  Regular chest film showed Hilaar area which was followed by a CT of the chest which showed pneumonia.        Jethro Bastos, NP 05/30/11 [phone]

## 2011-05-30 NOTE — ED Provider Notes (Signed)
Medical screening examination/treatment/procedure(s) were performed by non-physician practitioner and as supervising physician I was immediately available for consultation/collaboration.   Lyanne Co, MD 05/30/11 2150

## 2011-05-31 ENCOUNTER — Encounter: Payer: Self-pay | Admitting: Infectious Diseases

## 2011-05-31 ENCOUNTER — Telehealth: Payer: Self-pay | Admitting: *Deleted

## 2011-05-31 ENCOUNTER — Ambulatory Visit: Payer: No Typology Code available for payment source | Admitting: Infectious Diseases

## 2011-05-31 ENCOUNTER — Ambulatory Visit (INDEPENDENT_AMBULATORY_CARE_PROVIDER_SITE_OTHER): Payer: Medicaid Other | Admitting: Infectious Diseases

## 2011-05-31 ENCOUNTER — Other Ambulatory Visit: Payer: Self-pay | Admitting: *Deleted

## 2011-05-31 DIAGNOSIS — Z124 Encounter for screening for malignant neoplasm of cervix: Secondary | ICD-10-CM

## 2011-05-31 DIAGNOSIS — G4700 Insomnia, unspecified: Secondary | ICD-10-CM

## 2011-05-31 DIAGNOSIS — J189 Pneumonia, unspecified organism: Secondary | ICD-10-CM

## 2011-05-31 DIAGNOSIS — Z113 Encounter for screening for infections with a predominantly sexual mode of transmission: Secondary | ICD-10-CM

## 2011-05-31 DIAGNOSIS — B20 Human immunodeficiency virus [HIV] disease: Secondary | ICD-10-CM

## 2011-05-31 DIAGNOSIS — Z720 Tobacco use: Secondary | ICD-10-CM

## 2011-05-31 DIAGNOSIS — Z79899 Other long term (current) drug therapy: Secondary | ICD-10-CM

## 2011-05-31 DIAGNOSIS — F17200 Nicotine dependence, unspecified, uncomplicated: Secondary | ICD-10-CM

## 2011-05-31 MED ORDER — BUPROPION HCL ER (SR) 150 MG PO TB12: 150.0000 mg | ORAL_TABLET | Freq: Two times a day (BID) | ORAL | Status: DC

## 2011-05-31 MED ORDER — TEMAZEPAM 15 MG PO CAPS: 15.0000 mg | ORAL_CAPSULE | Freq: Every evening | ORAL | Status: DC | PRN

## 2011-05-31 NOTE — Assessment & Plan Note (Signed)
Will see her back in 3 months with CXR prior

## 2011-05-31 NOTE — Assessment & Plan Note (Signed)
Appears to be doing well. Offered condoms, Patent examiner. rtc 3 months

## 2011-05-31 NOTE — Assessment & Plan Note (Signed)
Will change her trazodone to restoril.

## 2011-05-31 NOTE — Telephone Encounter (Signed)
Call to remind patient she needs a chest xray middle of February Vanessa Browning CMA

## 2011-05-31 NOTE — Assessment & Plan Note (Signed)
Since she has been ill, she has decreased her cigarettes significantly. She wants to quit. Will start wellbutrin.

## 2011-05-31 NOTE — Progress Notes (Signed)
Addended by: Thaniel Coluccio C on: 05/31/2011 03:53 PM   Modules accepted: Orders

## 2011-05-31 NOTE — Progress Notes (Signed)
  Subjective:    Patient ID: Vanessa Browning, female    DOB: 03/22/1966, 45 y.o.   MRN: [id number]  HPI 45 yo F with hx of HIV+. Was changed from KLT to ISN in April of 2012. Also with hx of GERD. Last CD4 720 and VL <20. Was seen in ED 12-19 for pneumonia- started on levaquin. CT scan- There is consolidation with air bronchogram in the right lower lobe retro hilar posteromedial.  Measures at least 3.6 cm. Most likely due to focal pneumonia. Has been taking anbx- breathing is better, coughing is not better. Decreased myalgias. Contniues to have headaches, L frontal. Having sinus drainage, helps her breath to clear this. Her prev medications (trazidone) for helping her sleep are no longer working as well. Has been having decreased apettite as well.     Review of Systems     Objective:   Physical Exam  Constitutional: She appears well-nourished.  HENT:  Head: Normocephalic.  Mouth/Throat: No oropharyngeal exudate.  Eyes: EOM are normal. Pupils are equal, round, and reactive to light.  Neck: Neck supple.  Cardiovascular: Normal rate, regular rhythm and normal heart sounds.   Pulmonary/Chest: Effort normal. She has decreased breath sounds in the right lower field. She has rhonchi in the right lower field.    Lymphadenopathy:    She has no cervical adenopathy.          Assessment & Plan:

## 2011-05-31 NOTE — Assessment & Plan Note (Signed)
Normal PAP April 2012, repeat in 2013

## 2011-06-14 ENCOUNTER — Telehealth: Payer: Self-pay | Admitting: *Deleted

## 2011-06-14 NOTE — Telephone Encounter (Signed)
Patient called regarding her medicine to help quit smoking, she thought she did not receive it.  Explained that Dr. Ninetta Lights prescribed Wellbutrin for this.  She actually did receive it, but did not know that it was used for that. Wendall Mola CMA

## 2011-06-18 ENCOUNTER — Ambulatory Visit: Payer: Self-pay | Admitting: Infectious Diseases

## 2011-06-26 ENCOUNTER — Other Ambulatory Visit: Payer: Self-pay | Admitting: Licensed Clinical Social Worker

## 2011-06-26 DIAGNOSIS — G4700 Insomnia, unspecified: Secondary | ICD-10-CM

## 2011-06-26 DIAGNOSIS — R52 Pain, unspecified: Secondary | ICD-10-CM

## 2011-06-26 MED ORDER — TRAMADOL HCL 50 MG PO TABS: 50.0000 mg | ORAL_TABLET | Freq: Three times a day (TID) | ORAL | Status: DC | PRN

## 2011-06-29 ENCOUNTER — Telehealth: Payer: Self-pay | Admitting: *Deleted

## 2011-06-29 ENCOUNTER — Other Ambulatory Visit: Payer: Self-pay | Admitting: *Deleted

## 2011-06-29 DIAGNOSIS — G4700 Insomnia, unspecified: Secondary | ICD-10-CM

## 2011-06-29 MED ORDER — TEMAZEPAM 15 MG PO CAPS: 15.0000 mg | ORAL_CAPSULE | Freq: Every evening | ORAL | Status: DC | PRN

## 2011-06-29 NOTE — Telephone Encounter (Signed)
Received a Prior Authorization request from [facility] for Restoril 15 mg, for a quantity of 30.  Per Dr. Ninetta Lights he changed it to quantity 15 and I notified the patient and [facility].  Wendall Mola  CMA

## 2011-07-10 NOTE — Progress Notes (Signed)
Patient ID: Vanessa Browning, female   DOB: 1965/08/13, 46 y.o.   MRN: [id number] Subjective:    Patient ID: Vanessa Browning is a 46 y.o. female.  Chief Complaint: HIV Follow-up Visit [name] is here for follow-up of HIV infection. She is feeling unchanged since her last visit.  He claims continued adherence to therapy with good tolerance and no complications. There are not additional complaints.   Data Review: Diagnostic studies reviewed.  Review of Systems - General NWG:NFAOZHYQ for - fatigue, fever, hot flashes, malaise or weight loss Psychological ROS: negative for - anxiety, behavioral disorder, concentration difficulties or mood swings Ophthalmic ROS: negative for - blurry vision, decreased vision or double vision Breast ROS: negative for breast lumps Respiratory ROS: no cough, shortness of breath, or wheezing Cardiovascular ROS: no chest pain or dyspnea on exertion Gastrointestinal ROS: no abdominal pain, change in bowel habits, or black or bloody stools Dermatological ROS: negative for rash and skin lesion changes  Objective:  General appearance: alert Head: Normocephalic, without obvious abnormality, atraumatic Neck: no adenopathy, no carotid bruit, no JVD, supple, symmetrical, trachea midline and thyroid not enlarged, symmetric, no tenderness/mass/nodules Resp: clear to auscultation bilaterally Cardio: regular rate and rhythm, S1, S2 normal, no murmur, click, rub or gallop Neurologic: Grossly normal Skin:  No active lesions or rashes noted.    Psych:  No vegetative signs or delusional behaviors noted.    Laboratory: From 02/14/2011 ,  CD4 count was 720 c/cmm @ 29 %. HIV viral load less than 20 copies/mL.     Assessment/Plan:   HIV DISEASE Clinically stable on current regimen. Continue present management.  Counseling provided on prevention of transmission of HIV. Medication adherence discussed with patient. Follow up visit in 4 months with labs 2 weeks prior to  appointment.      Edin Kon A. Sundra Aland, MS, [facility] [phone]  07/10/2011, 10:29 AM

## 2011-07-10 NOTE — Assessment & Plan Note (Signed)
Clinically stable on current regimen. Continue present management.   

## 2011-07-17 ENCOUNTER — Other Ambulatory Visit: Payer: Self-pay | Admitting: *Deleted

## 2011-07-17 DIAGNOSIS — R52 Pain, unspecified: Secondary | ICD-10-CM

## 2011-07-17 MED ORDER — TRAMADOL HCL 50 MG PO TABS: 50.0000 mg | ORAL_TABLET | Freq: Three times a day (TID) | ORAL | Status: DC | PRN

## 2011-07-17 NOTE — Telephone Encounter (Signed)
Sent to wrong pharmacy. Called & cancelled it & then sent to [facility]

## 2011-07-19 ENCOUNTER — Other Ambulatory Visit: Payer: Self-pay | Admitting: *Deleted

## 2011-07-19 NOTE — Telephone Encounter (Signed)
I had rec'd a request from pharmacy for trazadone refill. Per md, this was d/c'd & another med rx'd

## 2011-07-20 ENCOUNTER — Telehealth: Payer: Self-pay | Admitting: *Deleted

## 2011-07-20 NOTE — Telephone Encounter (Signed)
Called patient and left her a voicemail, she needs to have a repeat chest xray next week.  The order is already in [facility]. Wendall Mola CMA

## 2011-07-24 NOTE — Assessment & Plan Note (Signed)
We'll try black cohosh as a measure to treat these hot flashes and see, if this improves. She is to contact the clinic if she does not get any relief with this therapy.

## 2011-07-24 NOTE — Progress Notes (Signed)
Subjective:    Patient ID: Vanessa Browning is a 46 y.o. female.  Chief Complaint: HIV Follow-up Visit Vanessa Browning is here for follow-up of HIV infection. She is feeling worse since her last visit.  She claims continued adherence to therapy with good tolerance and no complications. There are additional complaints. Complaining of significant hot flashes and perimenopausal state. Is not certain what to use for, what to do and they have interfered with her activities of daily living and had made her quite irritable.  Data Review: Diagnostic studies reviewed.  Review of Systems - General ROS: positive for  - fatigue and hot flashes Psychological ROS: positive for - irritability Ophthalmic ROS: negative ENT ROS: negative Breast ROS: negative for breast lumps Respiratory ROS: no cough, shortness of breath, or wheezing Cardiovascular ROS: no chest pain or dyspnea on exertion Gastrointestinal ROS: no abdominal pain, change in bowel habits, or black or bloody stools Musculoskeletal ROS: negative Neurological ROS: no TIA or stroke symptoms Dermatological ROS: negative  Objective:   General appearance: alert, cooperative and flushed Head: Normocephalic, without obvious abnormality, atraumatic Eyes: conjunctivae/corneas clear. PERRL, EOM's intact. Fundi benign. Ears: normal TM's and external ear canals both ears Throat: lips, mucosa, and tongue normal; teeth and gums normal Resp: clear to auscultation bilaterally Cardio: regular rate and rhythm, S1, S2 normal, no murmur, click, rub or gallop GI: soft, non-tender; bowel sounds normal; no masses,  no organomegaly Extremities: extremities normal, atraumatic, no cyanosis or edema Skin: Skin color, texture, turgor normal. No rashes or lesions Neurologic: Alert and oriented X 3, normal strength and tone. Normal symmetric reflexes. Normal coordination and gait Psych:  No vegetative signs or delusional behaviors noted.    Laboratory: From 10/26/2010  ,  CD4 count was 580 c/cmm @ 28 %. Viral load 35 copies/ml.     Assessment/Plan:   HIV DISEASE Clinically stable on current regimen. Continue present management.  Counseling provided on prevention of transmission of HIV. Condoms offered:  Refused Medication adherence discussed with patient. Referrals: None Follow up visit in 3 months with labs 2 weeks prior to appointment. Patient verbally acknowledged information provided to them and agreed with plan of care.   HOT FLASHES We'll try black cohosh as a measure to treat these hot flashes and see, if this improves. She is to contact the clinic if she does not get any relief with this therapy.     Vanessa Browning A. Sundra Aland, MS, [facility] [phone]  07/24/2011, 5:46 PM

## 2011-07-24 NOTE — Assessment & Plan Note (Addendum)
Clinically stable on current regimen. Continue present management.  Counseling provided on prevention of transmission of HIV. Condoms offered:  Refused Medication adherence discussed with patient. Referrals: None Follow up visit in 3 months with labs 2 weeks prior to appointment. Patient verbally acknowledged information provided to them and agreed with plan of care.  

## 2011-07-26 ENCOUNTER — Ambulatory Visit
Admission: RE | Admit: 2011-07-26 | Discharge: 2011-07-26 | Disposition: A | Discharge status: Home / Self Care | Payer: Medicaid Other | Source: Ambulatory Visit | Attending: Infectious Diseases | Admitting: Infectious Diseases

## 2011-07-26 ENCOUNTER — Telehealth: Payer: Self-pay | Admitting: *Deleted

## 2011-07-26 DIAGNOSIS — J189 Pneumonia, unspecified organism: Secondary | ICD-10-CM

## 2011-07-26 NOTE — Telephone Encounter (Signed)
She has lost 6 lbs since last visit in December.176 to 170  Due for another appt  in Late march. I told her to discuss with md at that visit. Feels fine with no efforts at weight loss. Also wanted to know why we were calling her. She was to have a follow up xray. I showed her where to go for it. She will get it now

## 2011-08-08 ENCOUNTER — Other Ambulatory Visit: Payer: Self-pay | Admitting: *Deleted

## 2011-08-08 DIAGNOSIS — Z720 Tobacco use: Secondary | ICD-10-CM

## 2011-08-08 DIAGNOSIS — R52 Pain, unspecified: Secondary | ICD-10-CM

## 2011-08-08 MED ORDER — BUPROPION HCL ER (SR) 150 MG PO TB12: 150.0000 mg | ORAL_TABLET | Freq: Two times a day (BID) | ORAL | Status: DC

## 2011-08-08 MED ORDER — TRAMADOL HCL 50 MG PO TABS: 50.0000 mg | ORAL_TABLET | Freq: Three times a day (TID) | ORAL | Status: DC | PRN

## 2011-08-13 ENCOUNTER — Other Ambulatory Visit: Payer: Medicaid Other

## 2011-08-13 DIAGNOSIS — Z79899 Other long term (current) drug therapy: Secondary | ICD-10-CM

## 2011-08-13 DIAGNOSIS — B20 Human immunodeficiency virus [HIV] disease: Secondary | ICD-10-CM

## 2011-08-13 DIAGNOSIS — Z113 Encounter for screening for infections with a predominantly sexual mode of transmission: Secondary | ICD-10-CM

## 2011-08-13 LAB — COMPLETE METABOLIC PANEL WITH GFR
ALT: 30 U/L (ref 0–35)
AST: 32 U/L (ref 0–37)
Albumin: 4.1 g/dL (ref 3.5–5.2)
Alkaline Phosphatase: 132 U/L — ABNORMAL HIGH (ref 39–117)
BUN: 14 mg/dL (ref 6–23)
CO2: 26 mEq/L (ref 19–32)
Calcium: 9.4 mg/dL (ref 8.4–10.5)
Chloride: 105 mEq/L (ref 96–112)
Creat: 0.9 mg/dL (ref 0.50–1.10)
GFR, Est African American: 88 mL/min
GFR, Est Non African American: 76 mL/min
Glucose, Bld: 84 mg/dL (ref 70–99)
Potassium: 4.7 mEq/L (ref 3.5–5.3)
Sodium: 141 mEq/L (ref 135–145)
Total Bilirubin: 0.3 mg/dL (ref 0.3–1.2)
Total Protein: 7.5 g/dL (ref 6.0–8.3)

## 2011-08-13 LAB — CBC
HCT: 36.2 % (ref 36.0–46.0)
Hemoglobin: 11.7 g/dL — ABNORMAL LOW (ref 12.0–15.0)
MCH: 30.8 pg (ref 26.0–34.0)
MCHC: 32.3 g/dL (ref 30.0–36.0)
MCV: 95.3 fL (ref 78.0–100.0)
Platelets: 289 10*3/uL (ref 150–400)
RBC: 3.8 MIL/uL — ABNORMAL LOW (ref 3.87–5.11)
RDW: 12.7 % (ref 11.5–15.5)
WBC: 8.9 10*3/uL (ref 4.0–10.5)

## 2011-08-13 LAB — LIPID PANEL
Cholesterol: 209 mg/dL — ABNORMAL HIGH (ref 0–200)
HDL: 41 mg/dL (ref >39)
LDL Cholesterol: 97 mg/dL (ref 0–99)
Total CHOL/HDL Ratio: 5.1 Ratio
Triglycerides: 356 mg/dL — ABNORMAL HIGH (ref <150)
VLDL: 71 mg/dL — ABNORMAL HIGH (ref 0–40)

## 2011-08-13 LAB — RPR

## 2011-08-14 LAB — T-HELPER CELL (CD4) - (RCID CLINIC ONLY)
CD4 % Helper T Cell: 26 % — ABNORMAL LOW (ref 33–55)
CD4 T Cell Abs: 860 uL (ref 400–2700)

## 2011-08-15 LAB — HIV-1 RNA QUANT-NO REFLEX-BLD
HIV 1 RNA Quant: 28 copies/mL — ABNORMAL HIGH (ref <20)
HIV-1 RNA Quant, Log: 1.45 {Log} — ABNORMAL HIGH (ref <1.30)

## 2011-08-27 ENCOUNTER — Ambulatory Visit: Payer: Medicaid Other | Admitting: Adult Health

## 2011-08-27 ENCOUNTER — Ambulatory Visit (INDEPENDENT_AMBULATORY_CARE_PROVIDER_SITE_OTHER): Payer: Medicaid Other | Admitting: Infectious Diseases

## 2011-08-27 ENCOUNTER — Encounter: Payer: Self-pay | Admitting: Infectious Diseases

## 2011-08-27 VITALS — BP 126/85 | HR 96 | Temp 97.7°F | Wt 177.8 lb

## 2011-08-27 DIAGNOSIS — J189 Pneumonia, unspecified organism: Secondary | ICD-10-CM

## 2011-08-27 DIAGNOSIS — B20 Human immunodeficiency virus [HIV] disease: Secondary | ICD-10-CM

## 2011-08-27 DIAGNOSIS — G589 Mononeuropathy, unspecified: Secondary | ICD-10-CM

## 2011-08-27 DIAGNOSIS — F17200 Nicotine dependence, unspecified, uncomplicated: Secondary | ICD-10-CM

## 2011-08-27 DIAGNOSIS — Z720 Tobacco use: Secondary | ICD-10-CM

## 2011-08-27 NOTE — Assessment & Plan Note (Signed)
F/u CXR clean. Will mark as resolved

## 2011-08-27 NOTE — Progress Notes (Signed)
  Subjective:    Patient ID: Vanessa Browning, female    DOB: 1965/09/22, 46 y.o.   MRN: [id number]  HPI 46 yo F with hx of HIV+. Was changed from KLT to ISN/TRV in April of 2012. Also with hx of GERD. Last CD4 720 and VL <20. Was seen in ED 12-19 for pneumonia- started on levaquin. CT scan- There is consolidation with air bronchogram in the right lower lobe retro hilar posteromedial.  Measures at least 3.6 cm. Most likely due to focal pneumonia. F/U CXR 07-26-11 (-). Today states the pills she was changed to at last visit are not working. wellbutrin not working for smoking cessation. Also thought it was giving her vaginal d/c. Was taking nexium for GERD and was switched to generic (not working). Does not think the ultram is not working well (taking ibuprofen, pm pain pill).  Last PAP was 09-2010 (nl). HIV 1 RNA Quant (copies/mL)  Date Value  08/13/2011 28*  05/15/2011 <20   02/14/2011 <20      CD4 T Cell Abs (cmm)  Date Value  08/13/2011 860   05/15/2011 720   02/14/2011 720          Review of Systems  Constitutional: Positive for fatigue. Negative for appetite change and unexpected weight change.  Respiratory: Negative for shortness of breath.   Cardiovascular: Negative for chest pain.  Gastrointestinal: Negative for diarrhea and constipation.  Genitourinary: Negative for dysuria and menstrual problem.  Neurological: Positive for headaches.       Objective:   Physical Exam  Constitutional: She appears well-developed and well-nourished.  Eyes: EOM are normal. Pupils are equal, round, and reactive to light.  Neck: Neck supple.  Cardiovascular: Normal rate, regular rhythm and normal heart sounds.   Pulmonary/Chest: Effort normal and breath sounds normal.  Abdominal: Soft. Bowel sounds are normal. She exhibits no distension. There is no tenderness.  Lymphadenopathy:    She has no cervical adenopathy.          Assessment & Plan:

## 2011-08-27 NOTE — Assessment & Plan Note (Signed)
Will continue her wellbutrin and prn advil, trylenol. She is going to find PCP to help with this and her headaches.

## 2011-08-27 NOTE — Assessment & Plan Note (Signed)
She is doing very well. Will continue her current rx. Consider stribilid? Vaccines up to date. rtc in 6 months with labs prior. Offered/refused condoms.

## 2011-08-27 NOTE — Assessment & Plan Note (Signed)
Did not like wellbutrin. She has cut back and having difficulty affording cigarettes. Encouraged pt.

## 2011-09-05 ENCOUNTER — Other Ambulatory Visit: Payer: Self-pay | Admitting: Infectious Diseases

## 2011-09-05 DIAGNOSIS — F329 Major depressive disorder, single episode, unspecified: Secondary | ICD-10-CM

## 2011-09-05 DIAGNOSIS — R52 Pain, unspecified: Secondary | ICD-10-CM

## 2011-09-05 DIAGNOSIS — F32A Depression, unspecified: Secondary | ICD-10-CM

## 2011-09-05 DIAGNOSIS — K219 Gastro-esophageal reflux disease without esophagitis: Secondary | ICD-10-CM

## 2011-09-06 ENCOUNTER — Other Ambulatory Visit: Payer: Self-pay | Admitting: *Deleted

## 2011-09-06 DIAGNOSIS — B20 Human immunodeficiency virus [HIV] disease: Secondary | ICD-10-CM

## 2011-09-06 MED ORDER — RALTEGRAVIR POTASSIUM 400 MG PO TABS: 400.0000 mg | ORAL_TABLET | Freq: Two times a day (BID) | ORAL | Status: DC

## 2011-09-27 ENCOUNTER — Other Ambulatory Visit: Payer: Self-pay | Admitting: Infectious Diseases

## 2011-09-27 ENCOUNTER — Other Ambulatory Visit: Payer: Self-pay | Admitting: *Deleted

## 2011-09-27 DIAGNOSIS — G4700 Insomnia, unspecified: Secondary | ICD-10-CM

## 2011-09-27 DIAGNOSIS — J302 Other seasonal allergic rhinitis: Secondary | ICD-10-CM

## 2011-09-27 MED ORDER — TEMAZEPAM 15 MG PO CAPS: 15.0000 mg | ORAL_CAPSULE | Freq: Every evening | ORAL | Status: DC | PRN

## 2011-10-04 ENCOUNTER — Telehealth: Payer: Self-pay | Admitting: *Deleted

## 2011-10-04 NOTE — Telephone Encounter (Signed)
Message left requesting pt to call [facility] for PAP smear appt. 

## 2011-10-16 ENCOUNTER — Encounter: Payer: Self-pay | Admitting: *Deleted

## 2011-10-26 ENCOUNTER — Other Ambulatory Visit: Payer: Self-pay | Admitting: Infectious Diseases

## 2011-11-09 ENCOUNTER — Telehealth: Payer: Self-pay | Admitting: *Deleted

## 2011-11-09 ENCOUNTER — Ambulatory Visit: Payer: Medicaid Other

## 2011-11-09 NOTE — Telephone Encounter (Signed)
Made pt a new appt for PAP smear for Mon., June 10 @ 1:30 PM.

## 2011-11-19 ENCOUNTER — Other Ambulatory Visit (HOSPITAL_COMMUNITY)
Admission: RE | Admit: 2011-11-19 | Discharge: 2011-11-19 | Disposition: A | Discharge status: Home / Self Care | Payer: Medicaid Other | Source: Ambulatory Visit | Attending: Infectious Diseases | Admitting: Infectious Diseases

## 2011-11-19 ENCOUNTER — Ambulatory Visit (INDEPENDENT_AMBULATORY_CARE_PROVIDER_SITE_OTHER): Payer: Medicaid Other | Admitting: *Deleted

## 2011-11-19 DIAGNOSIS — Z124 Encounter for screening for malignant neoplasm of cervix: Secondary | ICD-10-CM

## 2011-11-19 DIAGNOSIS — Z01419 Encounter for gynecological examination (general) (routine) without abnormal findings: Secondary | ICD-10-CM | POA: Insufficient documentation

## 2011-11-19 NOTE — Patient Instructions (Addendum)
Use water-based lubricant and condoms during intercourse for your vaginal dryness.  Your PAP smear results will be ready in about a week.  I will be in touch if you need treatment for an infection.  Thank you for coming to the Center for your care.  Angelique Blonder, Charity fundraiser.

## 2011-11-19 NOTE — Progress Notes (Signed)
  Subjective:     Vanessa Browning is a 46 y.o. woman who comes in today for a  pap smear only. Previous abnormal Pap smears: no. Contraception: condoms, menopausal.  Objective:    There were no vitals taken for this visit.  Copious, whitish vaginal discharge present. Pelvic Exam: Pap smear obtained.   Assessment:    Screening pap smear.   Plan:    Follow up in one year, or as indicated by Pap results.  Pt given condoms.  Discussed vaginal dryness after menopause and the use of water-soluble lubricant.  Pt given educational materials re:  HIV and women, heart disease, diet, exercise, nutrition, PAP smears, BSE and self-esteem.  Pt recently received reminder to call for Mammogram appt.  Pt stated she would call to schedule a mammogram.

## 2011-11-27 ENCOUNTER — Encounter: Payer: Self-pay | Admitting: *Deleted

## 2011-12-06 ENCOUNTER — Other Ambulatory Visit: Payer: Self-pay | Admitting: Infectious Diseases

## 2011-12-06 DIAGNOSIS — Z1231 Encounter for screening mammogram for malignant neoplasm of breast: Secondary | ICD-10-CM

## 2011-12-14 ENCOUNTER — Ambulatory Visit: Payer: Medicaid Other

## 2011-12-27 ENCOUNTER — Encounter: Payer: Self-pay | Admitting: Gastroenterology

## 2012-01-21 ENCOUNTER — Encounter: Payer: Self-pay | Admitting: Gastroenterology

## 2012-01-23 ENCOUNTER — Ambulatory Visit: Payer: Medicaid Other | Admitting: Gastroenterology

## 2012-01-24 ENCOUNTER — Other Ambulatory Visit: Payer: Self-pay | Admitting: Infectious Diseases

## 2012-01-24 ENCOUNTER — Other Ambulatory Visit: Payer: Self-pay | Admitting: Adult Health

## 2012-02-18 ENCOUNTER — Other Ambulatory Visit: Payer: Self-pay | Admitting: Infectious Diseases

## 2012-02-18 ENCOUNTER — Telehealth: Payer: Self-pay | Admitting: Gastroenterology

## 2012-02-18 DIAGNOSIS — B20 Human immunodeficiency virus [HIV] disease: Secondary | ICD-10-CM

## 2012-02-18 NOTE — Telephone Encounter (Signed)
No answer and unable to leave a message,  Pt has never been seen here and needs to call the prescriber

## 2012-02-19 NOTE — Telephone Encounter (Signed)
Pt was advised that she should call prescriber with any problems with her medication.  The pt agreed

## 2012-02-20 ENCOUNTER — Other Ambulatory Visit (INDEPENDENT_AMBULATORY_CARE_PROVIDER_SITE_OTHER): Payer: Medicaid Other

## 2012-02-20 DIAGNOSIS — B20 Human immunodeficiency virus [HIV] disease: Secondary | ICD-10-CM

## 2012-02-20 LAB — COMPREHENSIVE METABOLIC PANEL
ALT: 31 U/L (ref 0–35)
AST: 25 U/L (ref 0–37)
Albumin: 4.4 g/dL (ref 3.5–5.2)
Alkaline Phosphatase: 119 U/L — ABNORMAL HIGH (ref 39–117)
BUN: 15 mg/dL (ref 6–23)
CO2: 27 mEq/L (ref 19–32)
Calcium: 10.2 mg/dL (ref 8.4–10.5)
Chloride: 106 mEq/L (ref 96–112)
Creat: 0.77 mg/dL (ref 0.50–1.10)
Glucose, Bld: 99 mg/dL (ref 70–99)
Potassium: 4.7 mEq/L (ref 3.5–5.3)
Sodium: 141 mEq/L (ref 135–145)
Total Bilirubin: 0.4 mg/dL (ref 0.3–1.2)
Total Protein: 7.8 g/dL (ref 6.0–8.3)

## 2012-02-20 LAB — CBC WITH DIFFERENTIAL/PLATELET
Basophils Absolute: 0 10*3/uL (ref 0.0–0.1)
Basophils Relative: 0 % (ref 0–1)
Eosinophils Absolute: 0.1 10*3/uL (ref 0.0–0.7)
Eosinophils Relative: 1 % (ref 0–5)
HCT: 36.6 % (ref 36.0–46.0)
Hemoglobin: 12.2 g/dL (ref 12.0–15.0)
Lymphocytes Relative: 34 % (ref 12–46)
Lymphs Abs: 2.9 10*3/uL (ref 0.7–4.0)
MCH: 31.6 pg (ref 26.0–34.0)
MCHC: 33.3 g/dL (ref 30.0–36.0)
MCV: 94.8 fL (ref 78.0–100.0)
Monocytes Absolute: 0.5 10*3/uL (ref 0.1–1.0)
Monocytes Relative: 6 % (ref 3–12)
Neutro Abs: 4.9 10*3/uL (ref 1.7–7.7)
Neutrophils Relative %: 59 % (ref 43–77)
Platelets: 311 10*3/uL (ref 150–400)
RBC: 3.86 MIL/uL — ABNORMAL LOW (ref 3.87–5.11)
RDW: 13.1 % (ref 11.5–15.5)
WBC: 8.4 10*3/uL (ref 4.0–10.5)

## 2012-02-21 LAB — T-HELPER CELL (CD4) - (RCID CLINIC ONLY)
CD4 % Helper T Cell: 25 % — ABNORMAL LOW (ref 33–55)
CD4 T Cell Abs: 730 uL (ref 400–2700)

## 2012-02-21 LAB — HIV-1 RNA QUANT-NO REFLEX-BLD
HIV 1 RNA Quant: <20 copies/mL (ref <20)
HIV-1 RNA Quant, Log: <1.3 {Log} (ref <1.30)

## 2012-02-25 ENCOUNTER — Ambulatory Visit (INDEPENDENT_AMBULATORY_CARE_PROVIDER_SITE_OTHER): Payer: Medicaid Other | Admitting: Gastroenterology

## 2012-02-25 ENCOUNTER — Encounter: Payer: Self-pay | Admitting: Gastroenterology

## 2012-02-25 VITALS — BP 120/64 | HR 80 | Ht 62.0 in | Wt 172.0 lb

## 2012-02-25 DIAGNOSIS — K219 Gastro-esophageal reflux disease without esophagitis: Secondary | ICD-10-CM

## 2012-02-25 NOTE — Patient Instructions (Addendum)
One of your biggest health concerns is your smoking.  This increases your risk for most cancers and serious cardiovascular diseases such as strokes, heart attacks.  You should try your best to stop.  If you need assistance, please contact your PCP or Smoking Cessation Class at [facility] [phone]) or [facility]uit[facility] (1-800-QUIT-NOW). You should be taking pantoprazole (one pill once daily, best 20-30 min before breakfast meal). Start taking zantac (or pepcid) one pill at bedtime every night. You should stop tea and alkaseltzer at bedtime (can make your acid problems worse). You will be set up for an upper endoscopy (for GERD, LEC + propofol).

## 2012-02-25 NOTE — Progress Notes (Signed)
HPI: This is a    very pleasant 46 year old woman whom I am meeting for the first time today.  Has had trouble with reflux for a long time.  Stomach on fire. Epigastrium and pyrosis.  This has been a problem for many years, worse in past 2 years.  Symptoms worse when laying down.  Has tried nexium in past.   Started dexilant, takes it at night, bedtime.  Seems like her symptoms are worse on dexilant.  So she stopped it.    Currently at night she takes a hot tea and alkaselzter.   Only at night.    Was taking zantact.  Has numbness in hands, arms.  Used to drink coffee to settle her stomach.  Drinks tea with most meals.  No etoh, she stopped.  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  Now drinks very little.    + smoker, about a PPD.  She takes "pain reliever PM every night."    Pretty rare other NSAIDs.  No ASA.  No dysphagia.  Overall weight down lately..  No overt bleeding.  Protonix is on her med list, she is not at all sure if she is taking them or now.   Review of systems: Pertinent positive and negative review of systems were noted in the above HPI section. Complete review of systems was performed and was otherwise normal.    Past Medical History  Diagnosis Date  . HIV (human immunodeficiency virus infection)   . GERD (gastroesophageal reflux disease)     Past Surgical History  Procedure Date  . Appendectomy   . Cholecystectomy   . Tonsillectomy     Current Outpatient Prescriptions  Medication Sig Dispense Refill  . Calcium Carbonate Antacid (ALKA-SELTZER ANTACID PO) Take by mouth as needed.      . gabapentin (NEURONTIN) 300 MG capsule Take 1 capsule by mouth 3 times a day  90 capsule  2  . loratadine (CLARITIN) 10 MG tablet Take 1 tablet (10 mg total) by mouth daily.  30 tablet  6  . pantoprazole (PROTONIX) 40 MG tablet Take 1 tablet (40 mg total) by mouth daily.  30 tablet  5  . raltegravir (ISENTRESS) 400 MG tablet Take 1 tablet (400 mg  total) by mouth 2 (two) times daily.  60 tablet  6  . TRUVADA 200-300 MG per tablet Take 1 tablet by mouth every day  30 tablet  10    Allergies as of 02/25/2012 - Review Complete 02/25/2012  Allergen Reaction Noted  . Dexilant (dexlansoprazole)  02/25/2012    Family History  Problem Relation Age of Onset  . Diabetes Brother   . Hypertension Brother     History   Social History  . Marital Status: Legally Separated    Spouse Name: N/A    Number of Children: N/A  . Years of Education: N/A   Occupational History  . Not on file.   Social History Main Topics  . Smoking status: Current Every Day Smoker -- 0.3 packs/day for 30 years    Types: Cigarettes  . Smokeless tobacco: Never Used  . Alcohol Use: No  . Drug Use: No  . Sexually Active: Yes -- Female partner(s)    Birth Control/ Protection: Condom     pt. declined condoms   Other Topics Concern  . Not on file   Social History Narrative  . No narrative on file       Physical Exam: BP 120/64  Pulse  80  Ht 5\' 2"  (1.575 m)  Wt 172 lb (78.019 kg)  BMI 31.46 kg/m2 Constitutional: generally well-appearing Psychiatric: alert and oriented x3 Eyes: extraocular movements intact Mouth: oral pharynx moist, no lesions Neck: supple no lymphadenopathy Cardiovascular: heart regular rate and rhythm Lungs: clear to auscultation bilaterally Abdomen: soft, nontender, nondistended, no obvious ascites, no peritoneal signs, normal bowel sounds Extremities: no lower extremity edema bilaterally Skin: no lesions on visible extremities    Assessment and plan: 46 y.o. female with  GERD, chronically. No alarm symptoms  It does not sound like she is taking proton pump inhibitor to correct time in relation to meals. I have had her change that. I am also adding H2 blocker at bedtime every night. This is particularly helpful 4 PM symptoms. I recommended she stop smoking as this can make acid symptoms worse. I also think the tea and  Alka-Seltzer which she takes at bedtime every night is probably hurting matters rather than helping. We'll proceed with EGD at her soonest convenience as well.

## 2012-03-03 ENCOUNTER — Other Ambulatory Visit: Payer: Self-pay | Admitting: *Deleted

## 2012-03-03 DIAGNOSIS — G4700 Insomnia, unspecified: Secondary | ICD-10-CM

## 2012-03-03 MED ORDER — TEMAZEPAM 15 MG PO CAPS: 15.0000 mg | ORAL_CAPSULE | Freq: Every evening | ORAL | Status: DC | PRN

## 2012-03-04 ENCOUNTER — Other Ambulatory Visit: Payer: Self-pay | Admitting: Infectious Diseases

## 2012-03-05 ENCOUNTER — Encounter: Payer: Self-pay | Admitting: Infectious Diseases

## 2012-03-05 ENCOUNTER — Ambulatory Visit (INDEPENDENT_AMBULATORY_CARE_PROVIDER_SITE_OTHER): Payer: Medicaid Other | Admitting: Infectious Diseases

## 2012-03-05 VITALS — BP 116/75 | HR 88 | Temp 97.8°F | Ht 65.0 in | Wt 176.0 lb

## 2012-03-05 DIAGNOSIS — K219 Gastro-esophageal reflux disease without esophagitis: Secondary | ICD-10-CM

## 2012-03-05 DIAGNOSIS — Z113 Encounter for screening for infections with a predominantly sexual mode of transmission: Secondary | ICD-10-CM

## 2012-03-05 DIAGNOSIS — B20 Human immunodeficiency virus [HIV] disease: Secondary | ICD-10-CM

## 2012-03-05 DIAGNOSIS — Z23 Encounter for immunization: Secondary | ICD-10-CM

## 2012-03-05 DIAGNOSIS — Z79899 Other long term (current) drug therapy: Secondary | ICD-10-CM

## 2012-03-05 NOTE — Assessment & Plan Note (Signed)
She is doing very well. Will continue her current rx (consider change to qday Integrase Inhibitor). Await integrase FDC. Given condoms. Will see back in 6 months. Gets fluvax today.

## 2012-03-05 NOTE — Assessment & Plan Note (Signed)
Greatly appreciate GI eval and f/u. awat result of her upcoming procedure

## 2012-03-05 NOTE — Progress Notes (Signed)
  Subjective:    Patient ID: Vanessa Browning, female    DOB: 04-11-1966, 46 y.o.   MRN: [id number]  HPI 46 yo F with hx of HIV+. Was changed from KLT to ISN/TRV in April of 2012. Also with hx of GERD.Last PAP was 11-2011 (nl). Has been seen again by GI- is set to have EGD (?) on October 8. Having lots of abd pain, "my stomach is on fire".  Having some occas cough. Some problems with numbness in hands after starting on new rx for GERD (since quit and has resolved).  HIV 1 RNA Quant (copies/mL)  Date Value  02/20/2012 <20   08/13/2011 28*  05/15/2011 <20      CD4 T Cell Abs (cmm)  Date Value  02/20/2012 730   08/13/2011 860   05/15/2011 720       Review of Systems  Constitutional: Negative for fever, chills, appetite change and unexpected weight change.  Gastrointestinal: Negative for diarrhea and constipation.  Genitourinary: Negative for dysuria.  Psychiatric/Behavioral: Negative for disturbed wake/sleep cycle.       Objective:   Physical Exam  Constitutional: She appears well-developed and well-nourished.  HENT:  Mouth/Throat: No oropharyngeal exudate.  Eyes: EOM are normal. Pupils are equal, round, and reactive to light.  Neck: Neck supple.  Cardiovascular: Normal rate, regular rhythm and normal heart sounds.   Pulmonary/Chest: Effort normal and breath sounds normal.  Abdominal: Soft. Bowel sounds are normal. She exhibits no distension. There is no tenderness.  Lymphadenopathy:    She has no cervical adenopathy.          Assessment & Plan:

## 2012-03-18 ENCOUNTER — Encounter: Payer: Self-pay | Admitting: Gastroenterology

## 2012-03-18 ENCOUNTER — Ambulatory Visit (AMBULATORY_SURGERY_CENTER): Payer: Medicaid Other | Admitting: Gastroenterology

## 2012-03-18 VITALS — BP 122/75 | HR 61 | Temp 96.7°F | Resp 18 | Ht 62.0 in | Wt 172.0 lb

## 2012-03-18 DIAGNOSIS — K219 Gastro-esophageal reflux disease without esophagitis: Secondary | ICD-10-CM

## 2012-03-18 DIAGNOSIS — K2970 Gastritis, unspecified, without bleeding: Secondary | ICD-10-CM

## 2012-03-18 DIAGNOSIS — K2990 Gastroduodenitis, unspecified, without bleeding: Secondary | ICD-10-CM

## 2012-03-18 MED ORDER — SODIUM CHLORIDE 0.9 % IV SOLN: 500.0000 mL | INTRAVENOUS | Status: DC

## 2012-03-18 NOTE — Progress Notes (Signed)
The pt tolerated the egd very well. Maw   

## 2012-03-18 NOTE — Patient Instructions (Addendum)
One of your biggest health concerns is your smoking.  This increases your risk for most cancers and serious cardiovascular diseases such as strokes, heart attacks.  You should try your best to stop.  If you need assistance, please contact your PCP or Smoking Cessation Class at [facility] [phone]) or [facility] Quit-Line (1-800-QUIT-NOW).   YOU HAD AN ENDOSCOPIC PROCEDURE TODAY AT THE [facility]: Refer to the procedure report that was given to you for any specific questions about what was found during the examination.  If the procedure report does not answer your questions, please call your gastroenterologist to clarify.  If you requested that your care partner not be given the details of your procedure findings, then the procedure report has been included in a sealed envelope for you to review at your convenience later.  YOU SHOULD EXPECT: Some feelings of bloating in the abdomen. Passage of more gas than usual.  Walking can help get rid of the air that was put into your GI tract during the procedure and reduce the bloating. If you had a lower endoscopy (such as a colonoscopy or flexible sigmoidoscopy) you may notice spotting of blood in your stool or on the toilet paper. If you underwent a bowel prep for your procedure, then you may not have a normal bowel movement for a few days.  DIET: Your first meal following the procedure should be a light meal and then it is ok to progress to your normal diet.  A half-sandwich or bowl of soup is an example of a good first meal.  Heavy or fried foods are harder to digest and may make you feel nauseous or bloated.  Likewise meals heavy in dairy and vegetables can cause extra gas to form and this can also increase the bloating.  Drink plenty of fluids but you should avoid alcoholic beverages for 24 hours.  ACTIVITY: Your care partner should take you home directly after the procedure.  You should plan to take it easy, moving slowly for the rest of  the day.  You can resume normal activity the day after the procedure however you should NOT DRIVE or use heavy machinery for 24 hours (because of the sedation medicines used during the test).    SYMPTOMS TO REPORT IMMEDIATELY: A gastroenterologist can be reached at any hour.  During normal business hours, 8:30 AM to 5:00 PM Monday through Friday, call [phone].  After hours and on weekends, please call the GI answering service at [phone] who will take a message and have the physician on call contact you.    Following upper endoscopy (EGD)  Vomiting of blood or coffee ground material  New chest pain or pain under the shoulder blades  Painful or persistently difficult swallowing  New shortness of breath  Fever of 100F or higher  Black, tarry-looking stools  FOLLOW UP: If any biopsies were taken you will be contacted by phone or by letter within the next 1-3 weeks.  Call your gastroenterologist if you have not heard about the biopsies in 3 weeks.  Our staff will call the home number listed on your records the next business day following your procedure to check on you and address any questions or concerns that you may have at that time regarding the information given to you following your procedure. This is a courtesy call and so if there is no answer at the home number and we have not heard from you through the emergency physician on call, we  will assume that you have returned to your regular daily activities without incident.  SIGNATURES/CONFIDENTIALITY: You and/or your care partner have signed paperwork which will be entered into your electronic medical record.  These signatures attest to the fact that that the information above on your After Visit Summary has been reviewed and is understood.  Full responsibility of the confidentiality of this discharge information lies with you and/or your care-partner.   Resume medications. Information given on gastritis with discharge  instructions.

## 2012-03-18 NOTE — Progress Notes (Signed)
Patient did not experience any of the following events: a burn prior to discharge; a fall within the facility; wrong site/side/patient/procedure/implant event; or a hospital transfer or hospital admission upon discharge from the facility. (G8907) Patient did not have preoperative order for IV antibiotic SSI prophylaxis. (G8918)  

## 2012-03-18 NOTE — Op Note (Signed)
[facility] [address] [place], [address]   ENDOSCOPY PROCEDURE REPORT  PATIENT: Vanessa Browning, Vanessa Browning  MR#: [id number] BIRTHDATE: 1966/05/18 , 45  yrs. old GENDER: Female ENDOSCOPIST: Rachael Fee, MD REFERRED BY:  Johny Sax, M.D. PROCEDURE DATE:  03/18/2012 PROCEDURE:  EGD w/ biopsy ASA CLASS:     Class III INDICATIONS:  GERD, dyspepsia. MEDICATIONS: MAC sedation, administered by CRNA and Propofol (Diprivan) 170 mg IV TOPICAL ANESTHETIC: Cetacaine Spray  DESCRIPTION OF PROCEDURE: After the risks benefits and alternatives of the procedure were thoroughly explained, informed consent was obtained.  The LB [id number]-[id number] endoscope was introduced through the mouth and advanced to the second portion of the duodenum. Without limitations.  The instrument was slowly withdrawn as the mucosa was fully examined.   There was mild, non-specific gastritis.  Biopsies taken and sent to pathology.  The examination was otherwise normal.  Retroflexed views revealed no abnormalities.     The scope was then withdrawn from the patient and the procedure completed. COMPLICATIONS: There were no complications.  ENDOSCOPIC IMPRESSION: There was mild, non-specific gastritis; biopsied. The examination was otherwise normal.  RECOMMENDATIONS: If biopsies show H.  pylori, you will be started on appropriate antibiotics. You need to stop taking Alka Seltzer, it is probably damaging your stomach and could be contributing to your GERD.  Decrease daily caffeine intake as well.    eSigned:  Rachael Fee, MD 03/18/2012 9:11 AM

## 2012-03-19 ENCOUNTER — Telehealth: Payer: Self-pay

## 2012-03-19 NOTE — Telephone Encounter (Signed)
  Follow up Call-  Call back number 03/18/2012  Post procedure Call Back phone  # [phone]  Permission to leave phone message Yes     Patient questions:  Do you have a fever, pain , or abdominal swelling? no Pain Score  0 *  Have you tolerated food without any problems? yes  Have you been able to return to your normal activities? yes  Do you have any questions about your discharge instructions: Diet   no Medications  no Follow up visit  no  Do you have questions or concerns about your Care? no  Actions: * If pain score is 4 or above: No action needed, pain <4.  Per the pt no problems. Maw

## 2012-03-26 ENCOUNTER — Other Ambulatory Visit: Payer: Self-pay | Admitting: *Deleted

## 2012-03-26 DIAGNOSIS — B20 Human immunodeficiency virus [HIV] disease: Secondary | ICD-10-CM

## 2012-03-26 MED ORDER — RALTEGRAVIR POTASSIUM 400 MG PO TABS: 400.0000 mg | ORAL_TABLET | Freq: Two times a day (BID) | ORAL | Status: DC

## 2012-03-26 MED ORDER — EMTRICITABINE-TENOFOVIR DF 200-300 MG PO TABS: 1.0000 | ORAL_TABLET | Freq: Every day | ORAL | Status: DC

## 2012-04-01 ENCOUNTER — Encounter: Payer: Self-pay | Admitting: Gastroenterology

## 2012-04-23 ENCOUNTER — Other Ambulatory Visit: Payer: Self-pay | Admitting: *Deleted

## 2012-04-23 DIAGNOSIS — J302 Other seasonal allergic rhinitis: Secondary | ICD-10-CM

## 2012-04-23 DIAGNOSIS — G589 Mononeuropathy, unspecified: Secondary | ICD-10-CM

## 2012-04-23 MED ORDER — LORATADINE 10 MG PO TABS: 10.0000 mg | ORAL_TABLET | Freq: Every day | ORAL | Status: DC

## 2012-04-23 MED ORDER — GABAPENTIN 300 MG PO CAPS: 300.0000 mg | ORAL_CAPSULE | Freq: Three times a day (TID) | ORAL | Status: DC

## 2012-07-22 ENCOUNTER — Other Ambulatory Visit: Payer: Self-pay | Admitting: Licensed Clinical Social Worker

## 2012-07-22 DIAGNOSIS — G4700 Insomnia, unspecified: Secondary | ICD-10-CM

## 2012-07-22 MED ORDER — TEMAZEPAM 15 MG PO CAPS: 15.0000 mg | ORAL_CAPSULE | Freq: Every evening | ORAL | Status: DC | PRN

## 2012-09-17 ENCOUNTER — Other Ambulatory Visit: Payer: Self-pay | Admitting: Infectious Diseases

## 2012-09-17 ENCOUNTER — Other Ambulatory Visit: Payer: Medicaid Other

## 2012-09-17 DIAGNOSIS — Z79899 Other long term (current) drug therapy: Secondary | ICD-10-CM

## 2012-09-17 DIAGNOSIS — Z113 Encounter for screening for infections with a predominantly sexual mode of transmission: Secondary | ICD-10-CM

## 2012-09-17 DIAGNOSIS — B20 Human immunodeficiency virus [HIV] disease: Secondary | ICD-10-CM

## 2012-09-17 LAB — CBC
HCT: 36.3 % (ref 36.0–46.0)
Hemoglobin: 12 g/dL (ref 12.0–15.0)
MCH: 30.5 pg (ref 26.0–34.0)
MCHC: 33.1 g/dL (ref 30.0–36.0)
MCV: 92.1 fL (ref 78.0–100.0)
Platelets: 265 10*3/uL (ref 150–400)
RBC: 3.94 MIL/uL (ref 3.87–5.11)
RDW: 13 % (ref 11.5–15.5)
WBC: 7.5 10*3/uL (ref 4.0–10.5)

## 2012-09-17 LAB — LIPID PANEL
Cholesterol: 225 mg/dL — ABNORMAL HIGH (ref 0–200)
HDL: 44 mg/dL (ref >39)
LDL Cholesterol: 146 mg/dL — ABNORMAL HIGH (ref 0–99)
Total CHOL/HDL Ratio: 5.1 Ratio
Triglycerides: 177 mg/dL — ABNORMAL HIGH (ref <150)
VLDL: 35 mg/dL (ref 0–40)

## 2012-09-17 LAB — COMPREHENSIVE METABOLIC PANEL
ALT: 25 U/L (ref 0–35)
AST: 22 U/L (ref 0–37)
Albumin: 4.3 g/dL (ref 3.5–5.2)
Alkaline Phosphatase: 111 U/L (ref 39–117)
BUN: 12 mg/dL (ref 6–23)
CO2: 27 mEq/L (ref 19–32)
Calcium: 9.3 mg/dL (ref 8.4–10.5)
Chloride: 107 mEq/L (ref 96–112)
Creat: 1.04 mg/dL (ref 0.50–1.10)
Glucose, Bld: 99 mg/dL (ref 70–99)
Potassium: 3.9 mEq/L (ref 3.5–5.3)
Sodium: 142 mEq/L (ref 135–145)
Total Bilirubin: 0.4 mg/dL (ref 0.3–1.2)
Total Protein: 7.4 g/dL (ref 6.0–8.3)

## 2012-09-17 LAB — RPR

## 2012-09-18 LAB — T-HELPER CELL (CD4) - (RCID CLINIC ONLY)
CD4 % Helper T Cell: 26 % — ABNORMAL LOW (ref 33–55)
CD4 T Cell Abs: 680 uL (ref 400–2700)

## 2012-09-19 LAB — HIV-1 RNA QUANT-NO REFLEX-BLD
HIV 1 RNA Quant: <20 copies/mL (ref <20)
HIV-1 RNA Quant, Log: <1.3 {Log} (ref <1.30)

## 2012-10-01 ENCOUNTER — Ambulatory Visit (INDEPENDENT_AMBULATORY_CARE_PROVIDER_SITE_OTHER): Payer: Medicaid Other | Admitting: Infectious Diseases

## 2012-10-01 ENCOUNTER — Encounter: Payer: Self-pay | Admitting: Infectious Diseases

## 2012-10-01 VITALS — BP 125/89 | HR 71 | Temp 98.3°F | Ht 64.0 in | Wt 181.0 lb

## 2012-10-01 DIAGNOSIS — K219 Gastro-esophageal reflux disease without esophagitis: Secondary | ICD-10-CM

## 2012-10-01 DIAGNOSIS — Z111 Encounter for screening for respiratory tuberculosis: Secondary | ICD-10-CM

## 2012-10-01 DIAGNOSIS — B20 Human immunodeficiency virus [HIV] disease: Secondary | ICD-10-CM

## 2012-10-01 DIAGNOSIS — E785 Hyperlipidemia, unspecified: Secondary | ICD-10-CM | POA: Insufficient documentation

## 2012-10-01 DIAGNOSIS — G4700 Insomnia, unspecified: Secondary | ICD-10-CM

## 2012-10-01 MED ORDER — TEMAZEPAM 15 MG PO CAPS: 30.0000 mg | ORAL_CAPSULE | Freq: Every evening | ORAL | Status: DC | PRN

## 2012-10-01 NOTE — Addendum Note (Signed)
Addended by: Andree Coss on: 10/01/2012 01:52 PM   Modules accepted: Orders

## 2012-10-01 NOTE — Assessment & Plan Note (Signed)
Will increase her dose of restoril. Try to avoid tylenol PM.

## 2012-10-01 NOTE — Progress Notes (Signed)
  Subjective:    Patient ID: Vanessa Browning, female    DOB: 03-18-1966, 47 y.o.   MRN: [id number]  HPI 47 yo F with hx of HIV+. Was changed from KLT to ISN/TRV in April of 2012. Also hx of insomnia, taking restoril and tylenol PM.  No problems with ART. Was sent to GI for GERD (endo-, told nothing wrong);  still watching what she eats. Has to drink something hot at night to help her get to sleep. Taking zantac, alka setzer, tums.   HIV 1 RNA Quant (copies/mL)  Date Value  09/17/2012 <20   02/20/2012 <20   08/13/2011 28*     CD4 T Cell Abs (cmm)  Date Value  09/17/2012 680   02/20/2012 730   08/13/2011 860    No menses since 2002  Review of Systems  Constitutional: Negative for appetite change and unexpected weight change.  Gastrointestinal: Positive for abdominal pain. Negative for diarrhea and constipation.  Genitourinary: Negative for difficulty urinating.       Objective:   Physical Exam  Constitutional: She appears well-developed and well-nourished.  HENT:  Mouth/Throat: No oropharyngeal exudate.  Eyes: EOM are normal. Pupils are equal, round, and reactive to light.  Neck: Neck supple.  Cardiovascular: Normal rate, regular rhythm and normal heart sounds.   Pulmonary/Chest: Effort normal and breath sounds normal.  Abdominal: Soft. Bowel sounds are normal. There is no tenderness.  Lymphadenopathy:    She has no cervical adenopathy.          Assessment & Plan:

## 2012-10-01 NOTE — Assessment & Plan Note (Signed)
Encouraged diet and exercise.  

## 2012-10-01 NOTE — Assessment & Plan Note (Signed)
encouoraged her to f/u with GI. She is hesitant to do this.

## 2012-10-01 NOTE — Assessment & Plan Note (Addendum)
She is doing well. Gets ppd today (for work). Will schedule for PAP. Gets condoms. Screen for HLA, for possible FDC ABC/DTV in future. rtc 6 months.

## 2012-10-02 ENCOUNTER — Other Ambulatory Visit: Payer: Self-pay | Admitting: *Deleted

## 2012-10-02 DIAGNOSIS — Z0000 Encounter for general adult medical examination without abnormal findings: Secondary | ICD-10-CM

## 2012-10-03 ENCOUNTER — Encounter: Payer: Self-pay | Admitting: Licensed Clinical Social Worker

## 2012-10-03 LAB — TB SKIN TEST
Induration: 0 mm
TB Skin Test: NEGATIVE

## 2012-10-17 ENCOUNTER — Ambulatory Visit (HOSPITAL_COMMUNITY): Payer: Medicaid Other | Attending: Infectious Diseases

## 2012-11-27 ENCOUNTER — Other Ambulatory Visit: Payer: Self-pay | Admitting: *Deleted

## 2012-11-27 DIAGNOSIS — J302 Other seasonal allergic rhinitis: Secondary | ICD-10-CM

## 2012-11-27 DIAGNOSIS — B20 Human immunodeficiency virus [HIV] disease: Secondary | ICD-10-CM

## 2012-11-27 DIAGNOSIS — G589 Mononeuropathy, unspecified: Secondary | ICD-10-CM

## 2012-11-27 MED ORDER — GABAPENTIN 300 MG PO CAPS: 300.0000 mg | ORAL_CAPSULE | Freq: Three times a day (TID) | ORAL | Status: DC

## 2012-11-27 MED ORDER — LORATADINE 10 MG PO TABS: 10.0000 mg | ORAL_TABLET | Freq: Every day | ORAL | Status: DC

## 2012-11-27 MED ORDER — RALTEGRAVIR POTASSIUM 400 MG PO TABS: 400.0000 mg | ORAL_TABLET | Freq: Two times a day (BID) | ORAL | Status: DC

## 2012-11-27 MED ORDER — EMTRICITABINE-TENOFOVIR DF 200-300 MG PO TABS: 1.0000 | ORAL_TABLET | Freq: Every day | ORAL | Status: DC

## 2012-11-28 ENCOUNTER — Other Ambulatory Visit: Payer: Self-pay | Admitting: Licensed Clinical Social Worker

## 2012-11-28 DIAGNOSIS — B20 Human immunodeficiency virus [HIV] disease: Secondary | ICD-10-CM

## 2012-11-28 DIAGNOSIS — J302 Other seasonal allergic rhinitis: Secondary | ICD-10-CM

## 2012-11-28 DIAGNOSIS — G589 Mononeuropathy, unspecified: Secondary | ICD-10-CM

## 2012-11-28 MED ORDER — EMTRICITABINE-TENOFOVIR DF 200-300 MG PO TABS: 1.0000 | ORAL_TABLET | Freq: Every day | ORAL | Status: DC

## 2012-11-28 MED ORDER — LORATADINE 10 MG PO TABS: 10.0000 mg | ORAL_TABLET | Freq: Every day | ORAL | Status: DC

## 2012-11-28 MED ORDER — GABAPENTIN 300 MG PO CAPS: 300.0000 mg | ORAL_CAPSULE | Freq: Three times a day (TID) | ORAL | Status: DC

## 2012-11-28 MED ORDER — RALTEGRAVIR POTASSIUM 400 MG PO TABS: 400.0000 mg | ORAL_TABLET | Freq: Two times a day (BID) | ORAL | Status: DC

## 2012-12-10 ENCOUNTER — Telehealth: Payer: Self-pay | Admitting: *Deleted

## 2012-12-10 ENCOUNTER — Encounter: Payer: Self-pay | Admitting: *Deleted

## 2012-12-10 NOTE — Telephone Encounter (Signed)
Unable to contact pt by phone.  Will send the pt a letter.

## 2012-12-18 ENCOUNTER — Other Ambulatory Visit: Payer: Self-pay | Admitting: Licensed Clinical Social Worker

## 2012-12-18 DIAGNOSIS — G4700 Insomnia, unspecified: Secondary | ICD-10-CM

## 2012-12-18 MED ORDER — TEMAZEPAM 15 MG PO CAPS: 30.0000 mg | ORAL_CAPSULE | Freq: Every evening | ORAL | Status: DC | PRN

## 2013-01-12 ENCOUNTER — Telehealth: Payer: Self-pay | Admitting: *Deleted

## 2013-01-12 NOTE — Telephone Encounter (Signed)
Patient called to get the a copy of the results of her TB test. Advised her will place it up front for her to pick up when she can.

## 2013-01-27 ENCOUNTER — Encounter: Payer: Self-pay | Admitting: Obstetrics

## 2013-01-29 ENCOUNTER — Other Ambulatory Visit: Payer: Self-pay | Admitting: *Deleted

## 2013-03-11 ENCOUNTER — Ambulatory Visit (INDEPENDENT_AMBULATORY_CARE_PROVIDER_SITE_OTHER): Payer: Medicaid Other | Admitting: Obstetrics

## 2013-03-11 ENCOUNTER — Encounter: Payer: Self-pay | Admitting: Obstetrics

## 2013-03-11 VITALS — BP 103/52 | HR 74 | Ht 63.0 in | Wt 179.0 lb

## 2013-03-11 DIAGNOSIS — Z0000 Encounter for general adult medical examination without abnormal findings: Secondary | ICD-10-CM

## 2013-03-11 DIAGNOSIS — N949 Unspecified condition associated with female genital organs and menstrual cycle: Secondary | ICD-10-CM | POA: Insufficient documentation

## 2013-03-11 DIAGNOSIS — Z1239 Encounter for other screening for malignant neoplasm of breast: Secondary | ICD-10-CM

## 2013-03-11 NOTE — Progress Notes (Signed)
Subjective:     Vanessa Browning is a 47 y.o. female here for a routine exam.  Current complaints: painful intercourse. Pt states she has pain in her lower abdomen with intercourse. Pt states she is also having vaginal dryness.   Personal health questionnaire reviewed: yes.   Gynecologic History No LMP recorded. Patient is not currently having periods (Reason: Perimenopausal). Contraception: condoms Last Pap: 07/ 2014. Results were: negative with positive HPV Last mammogram: 2013. Results were: normal  Obstetric History OB History  No data available     The following portions of the patient's history were reviewed and updated as appropriate: allergies, current medications, past family history, past medical history, past social history, past surgical history and problem list.  Review of Systems Pertinent items are noted in HPI.    Objective:    General appearance: alert and no distress Breasts: normal appearance, no masses or tenderness Abdomen: normal findings: soft, non-tender Pelvic: cervix normal in appearance, external genitalia normal, no adnexal masses or tenderness, no cervical motion tenderness, uterus normal size, shape, and consistency and vagina normal without discharge   Uterus tender with ? fundal fibroid.  Assessment:    Healthy female exam.   H/O HIV.  Stable.  H/O Uterine Fibroids.   Plan:    Education reviewed: safe sex/STD prevention and self breast exams. Mammogram ordered. Follow up in: 1 year. Ultrasound ordered for evaluation of fibroids.

## 2013-03-12 LAB — WET PREP BY MOLECULAR PROBE
Candida species: NEGATIVE
Gardnerella vaginalis: NEGATIVE
Trichomonas vaginosis: NEGATIVE

## 2013-03-12 LAB — PAP IG W/ RFLX HPV ASCU

## 2013-03-12 LAB — GC/CHLAMYDIA PROBE AMP
CT Probe RNA: NEGATIVE
GC Probe RNA: NEGATIVE

## 2013-03-26 ENCOUNTER — Ambulatory Visit (HOSPITAL_COMMUNITY)
Admission: RE | Admit: 2013-03-26 | Discharge: 2013-03-26 | Disposition: A | Discharge status: Home / Self Care | Payer: Medicaid Other | Source: Ambulatory Visit | Attending: Obstetrics | Admitting: Obstetrics

## 2013-03-26 ENCOUNTER — Other Ambulatory Visit: Payer: Self-pay | Admitting: Obstetrics

## 2013-03-26 DIAGNOSIS — Z1231 Encounter for screening mammogram for malignant neoplasm of breast: Secondary | ICD-10-CM | POA: Insufficient documentation

## 2013-03-26 DIAGNOSIS — N949 Unspecified condition associated with female genital organs and menstrual cycle: Secondary | ICD-10-CM

## 2013-03-26 DIAGNOSIS — IMO0002 Reserved for concepts with insufficient information to code with codable children: Secondary | ICD-10-CM | POA: Insufficient documentation

## 2013-03-26 DIAGNOSIS — Z1239 Encounter for other screening for malignant neoplasm of breast: Secondary | ICD-10-CM

## 2013-03-31 ENCOUNTER — Other Ambulatory Visit: Payer: Medicaid Other

## 2013-04-01 ENCOUNTER — Other Ambulatory Visit: Payer: Self-pay | Admitting: *Deleted

## 2013-04-01 DIAGNOSIS — G4700 Insomnia, unspecified: Secondary | ICD-10-CM

## 2013-04-01 MED ORDER — TEMAZEPAM 15 MG PO CAPS: 30.0000 mg | ORAL_CAPSULE | Freq: Every evening | ORAL | Status: DC | PRN

## 2013-04-14 ENCOUNTER — Ambulatory Visit: Payer: Medicaid Other | Admitting: Infectious Diseases

## 2013-04-15 ENCOUNTER — Telehealth: Payer: Self-pay | Admitting: *Deleted

## 2013-04-15 NOTE — Telephone Encounter (Signed)
Left message notifying pt of missed appointment.  Asked her to please call and reschedule. Andree Coss, RN

## 2013-04-16 ENCOUNTER — Other Ambulatory Visit (INDEPENDENT_AMBULATORY_CARE_PROVIDER_SITE_OTHER): Payer: Medicaid Other

## 2013-04-16 DIAGNOSIS — B20 Human immunodeficiency virus [HIV] disease: Secondary | ICD-10-CM

## 2013-04-16 DIAGNOSIS — E785 Hyperlipidemia, unspecified: Secondary | ICD-10-CM

## 2013-04-16 LAB — COMPREHENSIVE METABOLIC PANEL
ALT: 24 U/L (ref 0–35)
AST: 21 U/L (ref 0–37)
Albumin: 4 g/dL (ref 3.5–5.2)
Alkaline Phosphatase: 96 U/L (ref 39–117)
BUN: 16 mg/dL (ref 6–23)
CO2: 29 mEq/L (ref 19–32)
Calcium: 9.2 mg/dL (ref 8.4–10.5)
Chloride: 109 mEq/L (ref 96–112)
Creat: 0.88 mg/dL (ref 0.50–1.10)
Glucose, Bld: 90 mg/dL (ref 70–99)
Potassium: 4.3 mEq/L (ref 3.5–5.3)
Sodium: 143 mEq/L (ref 135–145)
Total Bilirubin: 0.3 mg/dL (ref 0.3–1.2)
Total Protein: 7.1 g/dL (ref 6.0–8.3)

## 2013-04-16 LAB — LIPID PANEL
Cholesterol: 195 mg/dL (ref 0–200)
HDL: 43 mg/dL (ref >39)
LDL Cholesterol: 107 mg/dL — ABNORMAL HIGH (ref 0–99)
Total CHOL/HDL Ratio: 4.5 Ratio
Triglycerides: 224 mg/dL — ABNORMAL HIGH (ref <150)
VLDL: 45 mg/dL — ABNORMAL HIGH (ref 0–40)

## 2013-04-16 LAB — CBC
HCT: 35.3 % — ABNORMAL LOW (ref 36.0–46.0)
Hemoglobin: 11.9 g/dL — ABNORMAL LOW (ref 12.0–15.0)
MCH: 31.4 pg (ref 26.0–34.0)
MCHC: 33.7 g/dL (ref 30.0–36.0)
MCV: 93.1 fL (ref 78.0–100.0)
Platelets: 268 10*3/uL (ref 150–400)
RBC: 3.79 MIL/uL — ABNORMAL LOW (ref 3.87–5.11)
RDW: 13.4 % (ref 11.5–15.5)
WBC: 8.9 10*3/uL (ref 4.0–10.5)

## 2013-04-17 LAB — T-HELPER CELL (CD4) - (RCID CLINIC ONLY)
CD4 % Helper T Cell: 25 % — ABNORMAL LOW (ref 33–55)
CD4 T Cell Abs: 680 /uL (ref 400–2700)

## 2013-04-17 LAB — HIV-1 RNA QUANT-NO REFLEX-BLD
HIV 1 RNA Quant: <20 copies/mL (ref <20)
HIV-1 RNA Quant, Log: <1.3 {Log} (ref <1.30)

## 2013-04-25 LAB — HLA B*5701

## 2013-04-27 ENCOUNTER — Encounter: Payer: Self-pay | Admitting: Infectious Diseases

## 2013-04-29 ENCOUNTER — Ambulatory Visit (INDEPENDENT_AMBULATORY_CARE_PROVIDER_SITE_OTHER): Payer: Medicaid Other | Admitting: Infectious Diseases

## 2013-04-29 ENCOUNTER — Encounter: Payer: Self-pay | Admitting: Infectious Diseases

## 2013-04-29 VITALS — BP 120/87 | HR 85 | Temp 98.3°F | Ht 64.0 in | Wt 181.0 lb

## 2013-04-29 DIAGNOSIS — K219 Gastro-esophageal reflux disease without esophagitis: Secondary | ICD-10-CM

## 2013-04-29 DIAGNOSIS — Z23 Encounter for immunization: Secondary | ICD-10-CM

## 2013-04-29 DIAGNOSIS — Z79899 Other long term (current) drug therapy: Secondary | ICD-10-CM

## 2013-04-29 DIAGNOSIS — E785 Hyperlipidemia, unspecified: Secondary | ICD-10-CM

## 2013-04-29 DIAGNOSIS — B20 Human immunodeficiency virus [HIV] disease: Secondary | ICD-10-CM

## 2013-04-29 DIAGNOSIS — Z113 Encounter for screening for infections with a predominantly sexual mode of transmission: Secondary | ICD-10-CM

## 2013-04-29 DIAGNOSIS — G4700 Insomnia, unspecified: Secondary | ICD-10-CM

## 2013-04-29 NOTE — Assessment & Plan Note (Signed)
We reviewed these. Needs to diet and exercise.

## 2013-04-29 NOTE — Progress Notes (Signed)
  Subjective:    Patient ID: Vanessa Browning, female    DOB: 06/21/1965, 47 y.o.   MRN: [id number]  HPI 47 yo F with hx of HIV+. Was changed from KLT to ISN/TRV in April of 2012.  Also hx of insomnia, GERD. Still taking alka setzer, protonix. Still having trouble with insomnia. Taking restoril, tylenol PM. Does not know what keeps her awake. Does not think it is stress. Mind always going.Marland KitchenMarland KitchenHas family issues which she feels responsible for.  Had NL PAP last month.   HIV 1 RNA Quant (copies/mL)  Date Value  04/16/2013 <20   09/17/2012 <20   02/20/2012 <20      CD4 T Cell Abs (/uL)  Date Value  04/16/2013 680   09/17/2012 680   02/20/2012 730     Review of Systems  Constitutional: Negative for appetite change and unexpected weight change.  Gastrointestinal: Negative for diarrhea and constipation.  Genitourinary: Negative for difficulty urinating.       Amenorrheic x 3 years.        Objective:   Physical Exam  Constitutional: She appears well-developed and well-nourished.  HENT:  Mouth/Throat: No oropharyngeal exudate.  Eyes: EOM are normal. Pupils are equal, round, and reactive to light.  Neck: Neck supple.  Cardiovascular: Normal rate, regular rhythm and normal heart sounds.   Pulmonary/Chest: Effort normal and breath sounds normal.  Abdominal: Soft. Bowel sounds are normal. There is no tenderness. There is no rebound.  Lymphadenopathy:    She has no cervical adenopathy.          Assessment & Plan:

## 2013-04-29 NOTE — Assessment & Plan Note (Signed)
Will continue her current regimen.

## 2013-04-29 NOTE — Assessment & Plan Note (Addendum)
i offered to change her to qday pill but she did not want to change. She gets flu shot today, PNVX is up to date. She has had nl pap and mammo. She would eye exam as her vision has been worsening. Will see her back in 6 months with labs.

## 2013-04-29 NOTE — Assessment & Plan Note (Signed)
Continue her current regimen. Consider GI referal..Marland KitchenMarland Kitchen

## 2013-05-04 ENCOUNTER — Telehealth: Payer: Self-pay | Admitting: *Deleted

## 2013-05-04 NOTE — Telephone Encounter (Signed)
Left message advising patient that any referrals need to come from her PCP, as she has [place] we are unable to make those for her.  Dr. Ninetta Lights wanted pt to have a GI (GERD) and Optometry (blurry vision) referral.   Andree Coss, RN

## 2013-07-07 ENCOUNTER — Other Ambulatory Visit: Payer: Self-pay | Admitting: *Deleted

## 2013-07-07 DIAGNOSIS — G589 Mononeuropathy, unspecified: Secondary | ICD-10-CM

## 2013-07-07 DIAGNOSIS — J302 Other seasonal allergic rhinitis: Secondary | ICD-10-CM

## 2013-07-07 MED ORDER — GABAPENTIN 300 MG PO CAPS
300.0000 mg | ORAL_CAPSULE | Freq: Three times a day (TID) | ORAL | Status: DC
Start: 2013-07-07 — End: 2013-10-05

## 2013-07-07 MED ORDER — LORATADINE 10 MG PO TABS
10.0000 mg | ORAL_TABLET | Freq: Every day | ORAL | Status: DC
Start: 2013-07-07 — End: 2013-10-06

## 2013-08-03 ENCOUNTER — Telehealth: Payer: Self-pay | Admitting: *Deleted

## 2013-08-03 DIAGNOSIS — G4700 Insomnia, unspecified: Secondary | ICD-10-CM

## 2013-08-03 MED ORDER — TEMAZEPAM 15 MG PO CAPS: 30.0000 mg | ORAL_CAPSULE | Freq: Every evening | ORAL | Status: DC | PRN

## 2013-08-03 NOTE — Telephone Encounter (Signed)
Pt requesting refill for temazepam.  OK to refill?

## 2013-08-03 NOTE — Telephone Encounter (Signed)
Received written note from Dr. Johnnye Sima for #20 w/ 3 refills.

## 2013-08-03 NOTE — Addendum Note (Signed)
Addended by: Lorne Skeens D on: 08/03/2013 05:07 PM   Modules accepted: Orders

## 2013-08-04 NOTE — Telephone Encounter (Signed)
Ok to refill 

## 2013-08-05 NOTE — Telephone Encounter (Signed)
MD refilled rx

## 2013-10-05 ENCOUNTER — Other Ambulatory Visit: Payer: Self-pay | Admitting: *Deleted

## 2013-10-05 DIAGNOSIS — G589 Mononeuropathy, unspecified: Secondary | ICD-10-CM

## 2013-10-05 MED ORDER — GABAPENTIN 300 MG PO CAPS: 300.0000 mg | ORAL_CAPSULE | Freq: Three times a day (TID) | ORAL | Status: DC

## 2013-10-06 ENCOUNTER — Other Ambulatory Visit: Payer: Self-pay | Admitting: Licensed Clinical Social Worker

## 2013-10-06 DIAGNOSIS — J302 Other seasonal allergic rhinitis: Secondary | ICD-10-CM

## 2013-10-06 MED ORDER — LORATADINE 10 MG PO TABS: 10.0000 mg | ORAL_TABLET | Freq: Every day | ORAL | Status: DC

## 2013-10-27 ENCOUNTER — Other Ambulatory Visit: Payer: Medicaid Other

## 2013-10-27 DIAGNOSIS — E785 Hyperlipidemia, unspecified: Secondary | ICD-10-CM

## 2013-10-27 DIAGNOSIS — Z113 Encounter for screening for infections with a predominantly sexual mode of transmission: Secondary | ICD-10-CM

## 2013-10-27 DIAGNOSIS — Z79899 Other long term (current) drug therapy: Secondary | ICD-10-CM

## 2013-10-27 DIAGNOSIS — B20 Human immunodeficiency virus [HIV] disease: Secondary | ICD-10-CM

## 2013-10-27 LAB — COMPLETE METABOLIC PANEL WITH GFR
ALT: 17 U/L (ref 0–35)
AST: 17 U/L (ref 0–37)
Albumin: 4.3 g/dL (ref 3.5–5.2)
Alkaline Phosphatase: 95 U/L (ref 39–117)
BUN: 12 mg/dL (ref 6–23)
CO2: 30 mEq/L (ref 19–32)
Calcium: 9.7 mg/dL (ref 8.4–10.5)
Chloride: 104 mEq/L (ref 96–112)
Creat: 0.85 mg/dL (ref 0.50–1.10)
GFR, Est African American: >89 mL/min
GFR, Est Non African American: 81 mL/min
Glucose, Bld: 89 mg/dL (ref 70–99)
Potassium: 4.1 mEq/L (ref 3.5–5.3)
Sodium: 140 mEq/L (ref 135–145)
Total Bilirubin: 0.4 mg/dL (ref 0.2–1.2)
Total Protein: 7.5 g/dL (ref 6.0–8.3)

## 2013-10-27 LAB — CBC WITH DIFFERENTIAL/PLATELET
Basophils Absolute: 0 10*3/uL (ref 0.0–0.1)
Basophils Relative: 0 % (ref 0–1)
Eosinophils Absolute: 0.1 10*3/uL (ref 0.0–0.7)
Eosinophils Relative: 1 % (ref 0–5)
HCT: 37.1 % (ref 36.0–46.0)
Hemoglobin: 12.4 g/dL (ref 12.0–15.0)
Lymphocytes Relative: 35 % (ref 12–46)
Lymphs Abs: 2.5 10*3/uL (ref 0.7–4.0)
MCH: 31.2 pg (ref 26.0–34.0)
MCHC: 33.4 g/dL (ref 30.0–36.0)
MCV: 93.5 fL (ref 78.0–100.0)
Monocytes Absolute: 0.4 10*3/uL (ref 0.1–1.0)
Monocytes Relative: 6 % (ref 3–12)
Neutro Abs: 4.2 10*3/uL (ref 1.7–7.7)
Neutrophils Relative %: 58 % (ref 43–77)
Platelets: 266 10*3/uL (ref 150–400)
RBC: 3.97 MIL/uL (ref 3.87–5.11)
RDW: 12.8 % (ref 11.5–15.5)
WBC: 7.2 10*3/uL (ref 4.0–10.5)

## 2013-10-27 LAB — LIPID PANEL
Cholesterol: 222 mg/dL — ABNORMAL HIGH (ref 0–200)
HDL: 46 mg/dL (ref >39)
LDL Cholesterol: 132 mg/dL — ABNORMAL HIGH (ref 0–99)
Total CHOL/HDL Ratio: 4.8 Ratio
Triglycerides: 220 mg/dL — ABNORMAL HIGH (ref <150)
VLDL: 44 mg/dL — ABNORMAL HIGH (ref 0–40)

## 2013-10-28 LAB — RPR

## 2013-10-28 LAB — HIV-1 RNA QUANT-NO REFLEX-BLD
HIV 1 RNA Quant: <20 copies/mL (ref <20)
HIV-1 RNA Quant, Log: <1.3 {Log} (ref <1.30)

## 2013-10-28 LAB — T-HELPER CELL (CD4) - (RCID CLINIC ONLY)
CD4 % Helper T Cell: 28 % — ABNORMAL LOW (ref 33–55)
CD4 T Cell Abs: 730 /uL (ref 400–2700)

## 2013-11-10 ENCOUNTER — Ambulatory Visit (INDEPENDENT_AMBULATORY_CARE_PROVIDER_SITE_OTHER): Payer: Medicaid Other | Admitting: Infectious Diseases

## 2013-11-10 ENCOUNTER — Encounter: Payer: Self-pay | Admitting: Infectious Diseases

## 2013-11-10 VITALS — BP 122/83 | HR 87 | Temp 98.7°F | Ht 62.0 in | Wt 171.0 lb

## 2013-11-10 DIAGNOSIS — R079 Chest pain, unspecified: Secondary | ICD-10-CM

## 2013-11-10 DIAGNOSIS — F17200 Nicotine dependence, unspecified, uncomplicated: Secondary | ICD-10-CM

## 2013-11-10 DIAGNOSIS — E785 Hyperlipidemia, unspecified: Secondary | ICD-10-CM

## 2013-11-10 DIAGNOSIS — B20 Human immunodeficiency virus [HIV] disease: Secondary | ICD-10-CM

## 2013-11-10 DIAGNOSIS — Z720 Tobacco use: Secondary | ICD-10-CM

## 2013-11-10 MED ORDER — ATORVASTATIN CALCIUM 10 MG PO TABS: 10.0000 mg | ORAL_TABLET | Freq: Every day | ORAL | Status: DC

## 2013-11-10 MED ORDER — NICOTINE 14 MG/24HR TD PT24: 14.0000 mg | MEDICATED_PATCH | Freq: Every day | TRANSDERMAL | Status: DC

## 2013-11-10 NOTE — Assessment & Plan Note (Signed)
Will have her seen by CV for exercise stress.

## 2013-11-10 NOTE — Progress Notes (Signed)
   Subjective:    Patient ID: Vanessa Browning, female    DOB: 12/30/1965, 48 y.o.   MRN: [id number]  HPI 48 yo F with hx of HIV+. Was changed from [place] to ISN/TRV in April of 2012. Also hx of insomnia, GERD. Having trouble with sharp chest pain. Relieved by being still. Pain worse with any movement. No radiation. No diaphoresis or sweats. Lasts for 1 minute. Has had for years. Feels like it is getting more frequent. Still taking protonix, alka-seltzer gummies. Feels different than her previous reflux.  Wants to quit smoking.   HIV 1 RNA Quant (copies/mL)  Date Value  10/27/2013 <20   04/16/2013 <20   09/17/2012 <20      CD4 T Cell Abs (/uL)  Date Value  10/27/2013 730   04/16/2013 680   09/17/2012 680    Asks about her lipids, have increased from previous visit.  Wt down, staying busy at [employer], working 2 jobs.   Review of Systems  Constitutional: Negative for appetite change and unexpected weight change.  Gastrointestinal: Negative for diarrhea and constipation.  Genitourinary: Negative for difficulty urinating and menstrual problem.  amhenorreic. Lat PAP? 03-2012      Objective:   Physical Exam  Constitutional: She appears well-developed and well-nourished.  HENT:  Mouth/Throat: No oropharyngeal exudate.  Eyes: EOM are normal. Pupils are equal, round, and reactive to light.  Neck: Neck supple.  Cardiovascular: Normal rate, regular rhythm and normal heart sounds.   Pulmonary/Chest: Effort normal and breath sounds normal. She exhibits no tenderness.  Abdominal: Soft. Bowel sounds are normal. There is no tenderness.  Musculoskeletal: She exhibits no edema.  Lymphadenopathy:    She has no cervical adenopathy.          Assessment & Plan:

## 2013-11-10 NOTE — Assessment & Plan Note (Signed)
Will start her on low dose lipitor rtc 3 months.

## 2013-11-10 NOTE — Assessment & Plan Note (Signed)
Will continue her current rx. Will set her up for PAP. Offered/refuses condoms. Will check Hep B SAb. Will see her back in 3 months.

## 2013-11-10 NOTE — Assessment & Plan Note (Signed)
Will start her on nicotine patches.

## 2013-11-11 LAB — HEPATITIS B SURFACE ANTIBODY,QUALITATIVE: Hep B S Ab: NEGATIVE

## 2013-11-16 ENCOUNTER — Ambulatory Visit (INDEPENDENT_AMBULATORY_CARE_PROVIDER_SITE_OTHER): Payer: Medicaid Other | Admitting: Cardiology

## 2013-11-16 ENCOUNTER — Encounter: Payer: Self-pay | Admitting: Cardiology

## 2013-11-16 VITALS — BP 110/62 | HR 78 | Ht 62.0 in | Wt 171.8 lb

## 2013-11-16 DIAGNOSIS — R079 Chest pain, unspecified: Secondary | ICD-10-CM

## 2013-11-16 DIAGNOSIS — Z720 Tobacco use: Secondary | ICD-10-CM

## 2013-11-16 DIAGNOSIS — F17200 Nicotine dependence, unspecified, uncomplicated: Secondary | ICD-10-CM

## 2013-11-16 DIAGNOSIS — E785 Hyperlipidemia, unspecified: Secondary | ICD-10-CM

## 2013-11-16 NOTE — Progress Notes (Signed)
Vanessa Browning Date of Birth: [birth year] Medical Record #[id number]  History of Present Illness: Vanessa Browning is seen at the request of Dr. Johnnye Sima for evaluation of chest pain. She is a pleasant 48 yo BF with history of hyperlipidemia, tobacco abuse, and HIV disease. She reports that she has had chest pain for years but that it has become more severe a lasts longer now. She describes this as a left peristernal pain. It is localized. It is worse with movement. It is sharp and stabbing in quality. It may occur 2-3 times a week and typically lasts less than 2 minutes. When it happens she states she has to freeze until it goes away. No palpitations, dyspnea, or diaphoresis. She states her brother has a pacemaker and her mother has an enlarged heart. She does have a history of drug abuse but states she hasn't used in 7 years.   Outpatient Prescriptions Prior to Visit  Medication Sig Dispense Refill  . atorvastatin (LIPITOR) 10 MG tablet Take 1 tablet (10 mg total) by mouth daily.  90 tablet  3  . Calcium Carbonate Antacid (ALKA-SELTZER ANTACID PO) Take by mouth as needed.      Marland Kitchen emtricitabine-tenofovir (TRUVADA) 200-300 MG per tablet Take 1 tablet by mouth daily.  30 tablet  12  . gabapentin (NEURONTIN) 300 MG capsule Take 1 capsule (300 mg total) by mouth 3 (three) times daily.  90 capsule  3  . loratadine (CLARITIN) 10 MG tablet Take 1 tablet (10 mg total) by mouth daily.  30 tablet  2  . nicotine (NICODERM CQ) 14 mg/24hr patch Place 1 patch (14 mg total) onto the skin daily.  28 patch  0  . pantoprazole (PROTONIX) 40 MG tablet Take 1 tablet (40 mg total) by mouth daily.  30 tablet  5  . raltegravir (ISENTRESS) 400 MG tablet Take 1 tablet (400 mg total) by mouth 2 (two) times daily.  60 tablet  12  . temazepam (RESTORIL) 15 MG capsule Take 2 capsules (30 mg total) by mouth at bedtime as needed.  20 capsule  3   No facility-administered medications prior to visit.    Allergies  Allergen  Reactions  . Dexilant [Dexlansoprazole]     Hands swelling     Past Medical History  Diagnosis Date  . HIV (human immunodeficiency virus infection)   . GERD (gastroesophageal reflux disease)   . Substance abuse   . Hyperlipidemia     Past Surgical History  Procedure Laterality Date  . Appendectomy    . Cholecystectomy    . Tonsillectomy      History   Social History  . Marital Status: Legally Separated    Spouse Name: N/A    Number of Children: N/A  . Years of Education: N/A   Social History Main Topics  . Smoking status: Current Every Day Smoker -- 0.30 packs/day for 30 years    Types: Cigarettes  . Smokeless tobacco: Never Used     Comment: "cutting back"  . Alcohol Use: No     Comment: socially  . Drug Use: No  . Sexual Activity: Yes    Partners: Male    Birth Control/ Protection: Condom   Other Topics Concern  . None   Social History Narrative  . None    Family History  Problem Relation Age of Onset  . Diabetes Brother   . Hypertension Brother   . Stomach cancer Neg Hx   . Rectal cancer Neg Hx   .  Colon cancer Neg Hx   . Colon polyps Neg Hx     Review of Systems: As noted in HPI>  All other systems were reviewed and are negative.  Physical Exam: BP 110/62  Pulse 78  Ht 5\' 2"  (1.575 m)  Wt 171 lb 12.8 oz (77.928 kg)  BMI 31.41 kg/m2 Filed Weights   11/16/13 1604  Weight: 171 lb 12.8 oz (77.928 kg)  GENERAL:  Well appearing black female in NAD. HEENT:  PERRL, EOMI, sclera are clear. Oropharynx is clear. NECK:  No jugular venous distention, carotid upstroke brisk and symmetric, no bruits, no thyromegaly or adenopathy LUNGS:  Clear to auscultation bilaterally CHEST:  Unremarkable. Mild tenderness to palpation in the superior portion of the right breast. HEART:  RRR,  PMI not displaced or sustained,S1 and S2 within normal limits, no S3, no S4: no clicks, no rubs, no murmurs ABD:  Soft, nontender. BS +, no masses or bruits. No hepatomegaly, no  splenomegaly EXT:  2 + pulses throughout, no edema, no cyanosis no clubbing SKIN:  Warm and dry.  No rashes NEURO:  Alert and oriented x 3. Cranial nerves II through XII intact. PSYCH:  Cognitively intact    LABORATORY DATA: Ecg: NSR with Normal Ecg. Rate 78 bpm.  Lab Results  Component Value Date   WBC 7.2 10/27/2013   HGB 12.4 10/27/2013   HCT 37.1 10/27/2013   PLT 266 10/27/2013   GLUCOSE 89 10/27/2013   CHOL 222* 10/27/2013   TRIG 220* 10/27/2013   HDL 46 10/27/2013   LDLCALC 132* 10/27/2013   ALT 17 10/27/2013   AST 17 10/27/2013   NA 140 10/27/2013   K 4.1 10/27/2013   CL 104 10/27/2013   CREATININE 0.85 10/27/2013   BUN 12 10/27/2013   CO2 30 10/27/2013     Assessment / Plan: 1. Atypical chest pain. I suspect her pain is musculoskeletal. She does have cardiac risk factors of smoking, HL, ? Family history. Will schedule for routine GXT. If normal would recommend risk factor modification. She was recently started on lipitor. Recommend smoking cessation.

## 2013-11-16 NOTE — Patient Instructions (Signed)
We will schedule you for an exercise stress test   

## 2013-11-20 ENCOUNTER — Ambulatory Visit: Payer: Medicaid Other

## 2013-11-26 ENCOUNTER — Other Ambulatory Visit: Payer: Self-pay | Admitting: Licensed Clinical Social Worker

## 2013-11-26 DIAGNOSIS — G4700 Insomnia, unspecified: Secondary | ICD-10-CM

## 2013-11-26 MED ORDER — TEMAZEPAM 15 MG PO CAPS: 30.0000 mg | ORAL_CAPSULE | Freq: Every evening | ORAL | Status: DC | PRN

## 2013-12-01 ENCOUNTER — Other Ambulatory Visit: Payer: Self-pay | Admitting: Licensed Clinical Social Worker

## 2013-12-01 DIAGNOSIS — Z720 Tobacco use: Secondary | ICD-10-CM

## 2013-12-01 MED ORDER — NICOTINE 14 MG/24HR TD PT24: 14.0000 mg | MEDICATED_PATCH | Freq: Every day | TRANSDERMAL | Status: DC

## 2013-12-03 ENCOUNTER — Other Ambulatory Visit: Payer: Self-pay | Admitting: *Deleted

## 2013-12-03 DIAGNOSIS — B20 Human immunodeficiency virus [HIV] disease: Secondary | ICD-10-CM

## 2013-12-03 MED ORDER — EMTRICITABINE-TENOFOVIR DF 200-300 MG PO TABS: 1.0000 | ORAL_TABLET | Freq: Every day | ORAL | Status: DC

## 2013-12-03 MED ORDER — RALTEGRAVIR POTASSIUM 400 MG PO TABS: 400.0000 mg | ORAL_TABLET | Freq: Two times a day (BID) | ORAL | Status: DC

## 2013-12-25 ENCOUNTER — Ambulatory Visit (INDEPENDENT_AMBULATORY_CARE_PROVIDER_SITE_OTHER): Payer: Medicaid Other | Admitting: Physician Assistant

## 2013-12-25 DIAGNOSIS — F17200 Nicotine dependence, unspecified, uncomplicated: Secondary | ICD-10-CM

## 2013-12-25 DIAGNOSIS — Z720 Tobacco use: Secondary | ICD-10-CM

## 2013-12-25 DIAGNOSIS — E785 Hyperlipidemia, unspecified: Secondary | ICD-10-CM

## 2013-12-25 DIAGNOSIS — R079 Chest pain, unspecified: Secondary | ICD-10-CM

## 2013-12-25 NOTE — Progress Notes (Signed)
Exercise Treadmill Test  Pre-Exercise Testing Evaluation Rhythm: sinus bradycardia  Rate: 57 bpm     Test  Exercise Tolerance Test Ordering MD: Peter Martinique, MD  Interpreting MD: Richardson Dopp, PA-C  Unique Test No: 1  Treadmill:  1  Indication for ETT: chest pain - rule out ischemia  Contraindication to ETT: No   Stress Modality: exercise - treadmill  Cardiac Imaging Performed: non   Protocol: standard Bruce - maximal  Max BP:  175/77  Max MPHR (bpm):  171 85% MPR (bpm):  145  MPHR obtained (bpm):  148 % MPHR obtained:  87  Reached 85% MPHR (min:sec):  4:45 Total Exercise Time (min-sec):  5:00  Workload in METS:  7.0 Borg Scale: 17  Reason ETT Terminated:  patient's desire to stop    ST Segment Analysis At Rest: normal ST segments - no evidence of significant ST depression With Exercise: no evidence of significant ST depression  Other Information Arrhythmia:  No Angina during ETT:  absent (0) Quality of ETT:  diagnostic  ETT Interpretation:  normal - no evidence of ischemia by ST analysis  Comments: Fair exercise capacity. No chest pain. Normal BP response to exercise. No ST changes to suggest ischemia.   Recommendations: F/u with Dr. Peter Martinique as directed. Signed,  Richardson Dopp, PA-C   12/25/2013 10:54 AM

## 2014-01-01 ENCOUNTER — Other Ambulatory Visit: Payer: Self-pay | Admitting: Licensed Clinical Social Worker

## 2014-01-01 DIAGNOSIS — J302 Other seasonal allergic rhinitis: Secondary | ICD-10-CM

## 2014-01-01 DIAGNOSIS — Z720 Tobacco use: Secondary | ICD-10-CM

## 2014-01-01 MED ORDER — NICOTINE 14 MG/24HR TD PT24: 14.0000 mg | MEDICATED_PATCH | Freq: Every day | TRANSDERMAL | Status: DC

## 2014-01-01 MED ORDER — LORATADINE 10 MG PO TABS: 10.0000 mg | ORAL_TABLET | Freq: Every day | ORAL | Status: DC

## 2014-01-27 ENCOUNTER — Other Ambulatory Visit: Payer: Medicaid Other

## 2014-01-28 ENCOUNTER — Other Ambulatory Visit: Payer: Self-pay | Admitting: *Deleted

## 2014-01-28 DIAGNOSIS — G589 Mononeuropathy, unspecified: Secondary | ICD-10-CM

## 2014-01-28 DIAGNOSIS — Z720 Tobacco use: Secondary | ICD-10-CM

## 2014-01-28 MED ORDER — NICOTINE 14 MG/24HR TD PT24: 14.0000 mg | MEDICATED_PATCH | Freq: Every day | TRANSDERMAL | Status: DC

## 2014-01-28 MED ORDER — GABAPENTIN 300 MG PO CAPS: 300.0000 mg | ORAL_CAPSULE | Freq: Three times a day (TID) | ORAL | Status: DC

## 2014-02-10 ENCOUNTER — Ambulatory Visit: Payer: Medicaid Other | Admitting: Infectious Diseases

## 2014-02-18 ENCOUNTER — Other Ambulatory Visit: Payer: Self-pay | Admitting: Infectious Diseases

## 2014-02-18 ENCOUNTER — Other Ambulatory Visit: Payer: Medicaid Other

## 2014-02-18 DIAGNOSIS — B20 Human immunodeficiency virus [HIV] disease: Secondary | ICD-10-CM

## 2014-02-18 LAB — COMPREHENSIVE METABOLIC PANEL
ALT: 24 U/L (ref 0–35)
AST: 23 U/L (ref 0–37)
Albumin: 4.4 g/dL (ref 3.5–5.2)
Alkaline Phosphatase: 101 U/L (ref 39–117)
BUN: 15 mg/dL (ref 6–23)
CO2: 29 mEq/L (ref 19–32)
Calcium: 9.5 mg/dL (ref 8.4–10.5)
Chloride: 106 mEq/L (ref 96–112)
Creat: 0.84 mg/dL (ref 0.50–1.10)
Glucose, Bld: 83 mg/dL (ref 70–99)
Potassium: 3.9 mEq/L (ref 3.5–5.3)
Sodium: 142 mEq/L (ref 135–145)
Total Bilirubin: 0.4 mg/dL (ref 0.2–1.2)
Total Protein: 7.3 g/dL (ref 6.0–8.3)

## 2014-02-18 LAB — CBC WITH DIFFERENTIAL/PLATELET
Basophils Absolute: 0 10*3/uL (ref 0.0–0.1)
Basophils Relative: 0 % (ref 0–1)
Eosinophils Absolute: 0.1 10*3/uL (ref 0.0–0.7)
Eosinophils Relative: 1 % (ref 0–5)
HCT: 35.2 % — ABNORMAL LOW (ref 36.0–46.0)
Hemoglobin: 11.9 g/dL — ABNORMAL LOW (ref 12.0–15.0)
Lymphocytes Relative: 33 % (ref 12–46)
Lymphs Abs: 2.5 10*3/uL (ref 0.7–4.0)
MCH: 32.4 pg (ref 26.0–34.0)
MCHC: 33.8 g/dL (ref 30.0–36.0)
MCV: 95.9 fL (ref 78.0–100.0)
Monocytes Absolute: 0.4 10*3/uL (ref 0.1–1.0)
Monocytes Relative: 5 % (ref 3–12)
Neutro Abs: 4.6 10*3/uL (ref 1.7–7.7)
Neutrophils Relative %: 61 % (ref 43–77)
Platelets: 233 10*3/uL (ref 150–400)
RBC: 3.67 MIL/uL — ABNORMAL LOW (ref 3.87–5.11)
RDW: 13.1 % (ref 11.5–15.5)
WBC: 7.6 10*3/uL (ref 4.0–10.5)

## 2014-02-19 LAB — HIV-1 RNA QUANT-NO REFLEX-BLD
HIV 1 RNA Quant: <20 copies/mL (ref <20)
HIV-1 RNA Quant, Log: <1.3 {Log} (ref <1.30)

## 2014-02-19 LAB — T-HELPER CELL (CD4) - (RCID CLINIC ONLY)
CD4 % Helper T Cell: 28 % — ABNORMAL LOW (ref 33–55)
CD4 T Cell Abs: 620 /uL (ref 400–2700)

## 2014-02-19 LAB — HEPATITIS C ANTIBODY: HCV Ab: NEGATIVE

## 2014-03-03 ENCOUNTER — Other Ambulatory Visit: Payer: Self-pay | Admitting: Licensed Clinical Social Worker

## 2014-03-03 DIAGNOSIS — Z720 Tobacco use: Secondary | ICD-10-CM

## 2014-03-03 MED ORDER — NICOTINE 14 MG/24HR TD PT24: 14.0000 mg | MEDICATED_PATCH | Freq: Every day | TRANSDERMAL | Status: DC

## 2014-03-11 ENCOUNTER — Ambulatory Visit: Payer: Medicaid Other | Admitting: Obstetrics

## 2014-03-17 ENCOUNTER — Ambulatory Visit: Payer: Medicaid Other | Admitting: Infectious Diseases

## 2014-03-22 ENCOUNTER — Ambulatory Visit: Payer: Medicaid Other | Admitting: Infectious Diseases

## 2014-04-02 ENCOUNTER — Other Ambulatory Visit: Payer: Self-pay | Admitting: Licensed Clinical Social Worker

## 2014-04-02 ENCOUNTER — Telehealth: Payer: Self-pay | Admitting: Licensed Clinical Social Worker

## 2014-04-02 DIAGNOSIS — G4700 Insomnia, unspecified: Secondary | ICD-10-CM

## 2014-04-02 DIAGNOSIS — J302 Other seasonal allergic rhinitis: Secondary | ICD-10-CM

## 2014-04-02 DIAGNOSIS — Z720 Tobacco use: Secondary | ICD-10-CM

## 2014-04-02 MED ORDER — TEMAZEPAM 15 MG PO CAPS: 30.0000 mg | ORAL_CAPSULE | Freq: Every evening | ORAL | Status: DC | PRN

## 2014-04-02 MED ORDER — LORATADINE 10 MG PO TABS: 10.0000 mg | ORAL_TABLET | Freq: Every day | ORAL | Status: DC

## 2014-04-02 MED ORDER — NICOTINE 14 MG/24HR TD PT24: 14.0000 mg | MEDICATED_PATCH | Freq: Every day | TRANSDERMAL | Status: DC

## 2014-04-02 NOTE — Telephone Encounter (Signed)
Patient has quantity of 20 for the Temazepam refill, as directed this will last her 10 days, is this right or should she have a 30 day supply? Please advise

## 2014-04-05 ENCOUNTER — Other Ambulatory Visit: Payer: Self-pay | Admitting: Licensed Clinical Social Worker

## 2014-04-05 DIAGNOSIS — G4700 Insomnia, unspecified: Secondary | ICD-10-CM

## 2014-04-05 MED ORDER — TEMAZEPAM 15 MG PO CAPS: 30.0000 mg | ORAL_CAPSULE | Freq: Every evening | ORAL | Status: DC | PRN

## 2014-04-05 NOTE — Telephone Encounter (Signed)
30 day supply ok thanks

## 2014-04-07 ENCOUNTER — Ambulatory Visit (INDEPENDENT_AMBULATORY_CARE_PROVIDER_SITE_OTHER): Payer: Medicaid Other | Admitting: Infectious Diseases

## 2014-04-07 ENCOUNTER — Encounter: Payer: Self-pay | Admitting: Infectious Diseases

## 2014-04-07 ENCOUNTER — Other Ambulatory Visit: Payer: Self-pay | Admitting: Infectious Diseases

## 2014-04-07 VITALS — BP 126/75 | HR 63 | Temp 98.1°F | Wt 168.0 lb

## 2014-04-07 DIAGNOSIS — Z1231 Encounter for screening mammogram for malignant neoplasm of breast: Secondary | ICD-10-CM

## 2014-04-07 DIAGNOSIS — Z23 Encounter for immunization: Secondary | ICD-10-CM

## 2014-04-07 DIAGNOSIS — K219 Gastro-esophageal reflux disease without esophagitis: Secondary | ICD-10-CM

## 2014-04-07 DIAGNOSIS — Z113 Encounter for screening for infections with a predominantly sexual mode of transmission: Secondary | ICD-10-CM

## 2014-04-07 DIAGNOSIS — G4700 Insomnia, unspecified: Secondary | ICD-10-CM

## 2014-04-07 DIAGNOSIS — E785 Hyperlipidemia, unspecified: Secondary | ICD-10-CM

## 2014-04-07 DIAGNOSIS — B20 Human immunodeficiency virus [HIV] disease: Secondary | ICD-10-CM

## 2014-04-07 MED ORDER — PANTOPRAZOLE SODIUM 40 MG PO TBEC: 40.0000 mg | DELAYED_RELEASE_TABLET | Freq: Two times a day (BID) | ORAL | Status: DC

## 2014-04-07 NOTE — Addendum Note (Signed)
Addended by: Myrtis Hopping A on: 04/07/2014 11:21 AM   Modules accepted: Orders

## 2014-04-07 NOTE — Assessment & Plan Note (Signed)
Will continue prn restoril, hopefully will improve with GERD rx.

## 2014-04-07 NOTE — Assessment & Plan Note (Signed)
Will continue low dose lipitor. Will repeat at her f/u visit. Paradoxically her Chol has increased. She states she has been better about her diet, i encouraged her to lose more wt.

## 2014-04-07 NOTE — Assessment & Plan Note (Signed)
Will change her protonix to bid.

## 2014-04-07 NOTE — Progress Notes (Signed)
   Subjective:    Patient ID: Vanessa Browning, female    DOB: Apr 19, 1966, 48 y.o.   MRN: [id number]  HPI 48 yo F with hx of HIV+. Was changed from [place] to ISN/TRV in April of 2012. Also hx of insomnia, GERD. Still unable to sleep due to GERD. States she was seen by GI and couldn't find anything wrong with her. Takes restoril , not every night.  Has been seen by [facility], states she had normal GXT.  Has not had PAP.   HIV 1 RNA Quant (copies/mL)  Date Value  02/18/2014 <20   10/27/2013 <20   04/16/2013 <20      CD4 T Cell Abs (/uL)  Date Value  02/18/2014 620   10/27/2013 730   04/16/2013 680    Lab Results  Component Value Date   CHOL 222* 10/27/2013   HDL 46 10/27/2013   LDLCALC 132* 10/27/2013   TRIG 220* 10/27/2013   CHOLHDL 4.8 10/27/2013     No missed ART  Review of Systems  Constitutional: Positive for appetite change and unexpected weight change.  Gastrointestinal: Positive for abdominal pain. Negative for diarrhea and constipation.  Genitourinary: Negative for difficulty urinating.       Objective:   Physical Exam  Constitutional: She appears well-developed and well-nourished.  HENT:  Mouth/Throat: No oropharyngeal exudate.  Eyes: EOM are normal. Pupils are equal, round, and reactive to light.  Neck: Neck supple.  Cardiovascular: Normal rate, regular rhythm and normal heart sounds.   Pulmonary/Chest: Effort normal and breath sounds normal.  Abdominal: Soft. Bowel sounds are normal. There is no tenderness. There is no rebound.  Lymphadenopathy:    She has no cervical adenopathy.          Assessment & Plan:

## 2014-04-07 NOTE — Assessment & Plan Note (Signed)
Will set her up for PAP. Will restart Hep B series. We discussed safe sex practices. Will see her back in 6 months. Encouraged her to continue her excellent adherence.

## 2014-04-13 ENCOUNTER — Ambulatory Visit: Payer: Medicaid Other

## 2014-04-27 ENCOUNTER — Ambulatory Visit
Admission: RE | Admit: 2014-04-27 | Discharge: 2014-04-27 | Disposition: A | Discharge status: Home / Self Care | Payer: Medicaid Other | Source: Ambulatory Visit | Attending: Infectious Diseases | Admitting: Infectious Diseases

## 2014-04-27 DIAGNOSIS — Z1231 Encounter for screening mammogram for malignant neoplasm of breast: Secondary | ICD-10-CM

## 2014-05-03 ENCOUNTER — Ambulatory Visit: Payer: Medicaid Other

## 2014-05-25 ENCOUNTER — Other Ambulatory Visit: Payer: Self-pay | Admitting: *Deleted

## 2014-05-25 DIAGNOSIS — Z720 Tobacco use: Secondary | ICD-10-CM

## 2014-05-25 DIAGNOSIS — K219 Gastro-esophageal reflux disease without esophagitis: Secondary | ICD-10-CM

## 2014-05-25 MED ORDER — NICOTINE 14 MG/24HR TD PT24: 14.0000 mg | MEDICATED_PATCH | Freq: Every day | TRANSDERMAL | Status: DC

## 2014-05-25 MED ORDER — PANTOPRAZOLE SODIUM 40 MG PO TBEC: 40.0000 mg | DELAYED_RELEASE_TABLET | Freq: Two times a day (BID) | ORAL | Status: DC

## 2014-05-27 ENCOUNTER — Other Ambulatory Visit: Payer: Self-pay | Admitting: Licensed Clinical Social Worker

## 2014-05-27 DIAGNOSIS — K219 Gastro-esophageal reflux disease without esophagitis: Secondary | ICD-10-CM

## 2014-05-27 MED ORDER — PANTOPRAZOLE SODIUM 40 MG PO TBEC: 40.0000 mg | DELAYED_RELEASE_TABLET | Freq: Two times a day (BID) | ORAL | Status: DC

## 2014-06-18 ENCOUNTER — Other Ambulatory Visit: Payer: Self-pay | Admitting: *Deleted

## 2014-06-18 DIAGNOSIS — B20 Human immunodeficiency virus [HIV] disease: Secondary | ICD-10-CM

## 2014-06-18 DIAGNOSIS — J302 Other seasonal allergic rhinitis: Secondary | ICD-10-CM

## 2014-06-18 MED ORDER — LORATADINE 10 MG PO TABS: 10.0000 mg | ORAL_TABLET | Freq: Every day | ORAL | Status: DC

## 2014-06-18 MED ORDER — RALTEGRAVIR POTASSIUM 400 MG PO TABS: 400.0000 mg | ORAL_TABLET | Freq: Two times a day (BID) | ORAL | Status: DC

## 2014-06-18 MED ORDER — EMTRICITABINE-TENOFOVIR DF 200-300 MG PO TABS: 1.0000 | ORAL_TABLET | Freq: Every day | ORAL | Status: DC

## 2014-07-27 ENCOUNTER — Other Ambulatory Visit: Payer: Self-pay | Admitting: *Deleted

## 2014-07-27 DIAGNOSIS — G589 Mononeuropathy, unspecified: Secondary | ICD-10-CM

## 2014-07-27 MED ORDER — GABAPENTIN 300 MG PO CAPS: 300.0000 mg | ORAL_CAPSULE | Freq: Three times a day (TID) | ORAL | Status: DC

## 2014-08-07 ENCOUNTER — Encounter (HOSPITAL_COMMUNITY): Payer: Self-pay | Admitting: Emergency Medicine

## 2014-08-07 ENCOUNTER — Emergency Department (HOSPITAL_COMMUNITY)
Admission: EM | Admit: 2014-08-07 | Discharge: 2014-08-07 | Disposition: A | Discharge status: Home / Self Care | Payer: Medicaid Other | Attending: Emergency Medicine | Admitting: Emergency Medicine

## 2014-08-07 ENCOUNTER — Emergency Department (HOSPITAL_COMMUNITY): Payer: Medicaid Other

## 2014-08-07 DIAGNOSIS — K219 Gastro-esophageal reflux disease without esophagitis: Secondary | ICD-10-CM | POA: Diagnosis not present

## 2014-08-07 DIAGNOSIS — Z720 Tobacco use: Secondary | ICD-10-CM | POA: Diagnosis not present

## 2014-08-07 DIAGNOSIS — Z79899 Other long term (current) drug therapy: Secondary | ICD-10-CM | POA: Diagnosis not present

## 2014-08-07 DIAGNOSIS — M25562 Pain in left knee: Secondary | ICD-10-CM | POA: Diagnosis present

## 2014-08-07 DIAGNOSIS — M25462 Effusion, left knee: Secondary | ICD-10-CM | POA: Insufficient documentation

## 2014-08-07 DIAGNOSIS — R52 Pain, unspecified: Secondary | ICD-10-CM | POA: Diagnosis not present

## 2014-08-07 DIAGNOSIS — E785 Hyperlipidemia, unspecified: Secondary | ICD-10-CM | POA: Insufficient documentation

## 2014-08-07 DIAGNOSIS — Z21 Asymptomatic human immunodeficiency virus [HIV] infection status: Secondary | ICD-10-CM | POA: Insufficient documentation

## 2014-08-07 MED ORDER — TRAMADOL HCL 50 MG PO TABS: 50.0000 mg | ORAL_TABLET | Freq: Four times a day (QID) | ORAL | Status: DC | PRN

## 2014-08-07 NOTE — Progress Notes (Signed)
VASCULAR LAB PRELIMINARY  PRELIMINARY  PRELIMINARY  PRELIMINARY  Left lower extremity venous Doppler completed.    Preliminary report:  There is no DVT, SVT, or Baker's cyst noted in the left lower extremity.   Inge Waldroup, RVT 08/07/2014, 11:35 AM

## 2014-08-07 NOTE — ED Provider Notes (Signed)
[name]: [id number]     Arrival date & time 08/07/14  9147 History   First MD Initiated Contact with Patient 08/07/14 0759     Chief Complaint  Patient presents with  . Knee Pain     (Consider location/radiation/quality/duration/timing/severity/associated sxs/prior Treatment) HPI Comments: 49 year old female presenting with 1 month of left knee pain, worsening over the past few days. Patient reports the pain is now waking her up at night. Pain starts in the back of her calf proximally, radiating around the front of her knee with mild swelling. No known injury or trauma. 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 Pain worse with walking. She has tried over-the-counter pain medication with temporary relief. Denies history of blood clots. She is a smoker. No recent surgeries or exogenous estrogen. Denies fever or chills.  Patient is a 49 y.o. female presenting with knee pain. The history is provided by the patient.  Knee Pain   Past Medical History  Diagnosis Date  . HIV (human immunodeficiency virus infection)   . GERD (gastroesophageal reflux disease)   . Substance abuse   . Hyperlipidemia    Past Surgical History  Procedure Laterality Date  . Appendectomy    . Cholecystectomy    . Tonsillectomy     Family History  Problem Relation Age of Onset  . Diabetes Brother   . Hypertension Brother   . Stomach cancer Neg Hx   . Rectal cancer Neg Hx   . Colon cancer Neg Hx   . Colon polyps Neg Hx    History  Substance Use Topics  . Smoking status: Current Every Day Smoker -- 0.30 packs/day for 30 years    Types: Cigarettes  . Smokeless tobacco: Never Used     Comment: "cutting back"  . Alcohol Use: No     Comment: socially   OB History    No data available     Review of Systems  Musculoskeletal:       + L knee and calf pain.  All other systems reviewed and are negative.     Allergies  Dexilant  Home Medications   Prior to  Admission medications   Medication Sig Start Date End Date Taking? Authorizing Provider  atorvastatin (LIPITOR) 10 MG tablet Take 1 tablet (10 mg total) by mouth daily. 11/10/13  Yes Campbell Riches, MD  Calcium Carbonate Antacid (ALKA-SELTZER ANTACID PO) Take 1 tablet by mouth as needed.    Yes Historical Provider, MD  emtricitabine-tenofovir (TRUVADA) 200-300 MG per tablet Take 1 tablet by mouth daily. 06/18/14  Yes Campbell Riches, MD  gabapentin (NEURONTIN) 300 MG capsule Take 1 capsule (300 mg total) by mouth 3 (three) times daily. 07/27/14  Yes Campbell Riches, MD  loratadine (CLARITIN) 10 MG tablet Take 1 tablet (10 mg total) by mouth daily. 06/18/14  Yes Campbell Riches, MD  nicotine (NICODERM CQ) 14 mg/24hr patch Place 1 patch (14 mg total) onto the skin daily. 05/25/14  Yes Campbell Riches, MD  pantoprazole (PROTONIX) 40 MG tablet Take 1 tablet (40 mg total) by mouth 2 (two) times daily. 05/27/14  Yes Campbell Riches, MD  raltegravir (ISENTRESS) 400 MG tablet Take 1 tablet (400 mg total) by mouth 2 (two) times daily. 06/18/14 06/18/15 Yes Campbell Riches, MD  temazepam (RESTORIL) 15 MG capsule Take 2 capsules (30 mg total) by mouth at bedtime as needed. Patient taking differently: Take 30 mg by mouth at  bedtime as needed for sleep.  04/05/14  Yes Campbell Riches, MD  traMADol (ULTRAM) 50 MG tablet Take 1 tablet (50 mg total) by mouth every 6 (six) hours as needed. 08/07/14   Skyllar Notarianni M Seth Friedlander, PA-C   BP 134/71 mmHg  Pulse 63  Temp(Src) 97.7 F (36.5 C) (Oral)  Resp 16  Ht 5\' 2"  (1.575 m)  Wt 157 lb (71.215 kg)  BMI 28.71 kg/m2  SpO2 100% Physical Exam  Constitutional: She is oriented to person, place, and time. She appears well-developed and well-nourished. No distress.  HENT:  Head: Normocephalic and atraumatic.  Mouth/Throat: Oropharynx is clear and moist.  Eyes: Conjunctivae and EOM are normal.  Neck: Normal range of motion. Neck supple.  Cardiovascular: Normal rate, regular  rhythm and normal heart sounds.   Pulses:      Posterior tibial pulses are 2+ on the left side.  Pulmonary/Chest: Effort normal and breath sounds normal. No respiratory distress.  Musculoskeletal:  L lower leg- TTP proximal calf with mild swelling. No palpable cords. TTP around patella with mild swelling. FROM. Negative Homans sign. No ligamentous laxity.  Neurological: She is alert and oriented to person, place, and time. No sensory deficit.  Skin: Skin is warm and dry.  Psychiatric: She has a normal mood and affect. Her behavior is normal.  Nursing note and vitals reviewed.   ED Course  Procedures (including critical care time) Labs Review Labs Reviewed - No data to display  Imaging Review Dg Knee Complete 4 Views Left  08/07/2014   CLINICAL DATA:  Left knee pain for 1 month, recent fall on stairs, initial encounter  EXAM: LEFT KNEE - COMPLETE 4+ VIEW  COMPARISON:  None.  FINDINGS: A moderate joint effusion is noted. No acute fracture or dislocation is seen. Very minimal osteophytic changes are noted medially. No other soft tissue abnormality is noted.  IMPRESSION: Minimal degenerative change.  Moderate joint effusion.   Electronically Signed   By: Inez Catalina M.D.   On: 08/07/2014 09:44     EKG Interpretation None      MDM   Final diagnoses:  Left knee pain  Knee swelling, left   NAD. AFVSS. LE venous duplex negative for DVT. Xray with moderate joint effusion. No bony abnormality. Symptoms ongoing for about 1 month. Knee sleeve applied. Rx tramadol. RICE, NSAIDs. F/u with PCP, if no improvement, may need ortho to r/o ligamentous injury. Ambulates without difficulty. Stable for d/c. Return precautions given. Patient states understanding of treatment care plan and is agreeable.  Carman Ching, PA-C 08/07/14 1151  Charlesetta Shanks, MD 08/11/14 [phone]

## 2014-08-07 NOTE — ED Notes (Signed)
Pt. Stated, I've had knee pain left  For about a month. I have an appt with Dr. Johnnye Sima on April 12.

## 2014-08-07 NOTE — Discharge Instructions (Signed)
Take tramadol as directed for pain. Rest, ice and elevate your knee. Follow up with Dr. Johnnye Sima.  Knee Effusion The medical term for having fluid in your knee is effusion. This is often due to an internal derangement of the knee. This means something is wrong inside the knee. Some of the causes of fluid in the knee may be torn cartilage, a torn ligament, or bleeding into the joint from an injury. Your knee is likely more difficult to bend and move. This is often because there is increased pain and pressure in the joint. The time it takes for recovery from a knee effusion depends on different factors, including:   Type of injury.  Your age.  Physical and medical conditions.  Rehabilitation Strategies. How long you will be away from your normal activities will depend on what kind of knee problem you have and how much damage is present. Your knee has two types of cartilage. Articular cartilage covers the bone ends and lets your knee bend and move smoothly. Two menisci, thick pads of cartilage that form a rim inside the joint, help absorb shock and stabilize your knee. Ligaments bind the bones together and support your knee joint. Muscles move the joint, help support your knee, and take stress off the joint itself. CAUSES  Often an effusion in the knee is caused by an injury to one of the menisci. This is often a tear in the cartilage. Recovery after a meniscus injury depends on how much meniscus is damaged and whether you have damaged other knee tissue. Small tears may heal on their own with conservative treatment. Conservative means rest, limited weight bearing activity and muscle strengthening exercises. Your recovery may take up to 6 weeks.  TREATMENT  Larger tears may require surgery. Meniscus injuries may be treated during arthroscopy. Arthroscopy is a procedure in which your surgeon uses a small telescope like instrument to look in your knee. Your caregiver can make a more accurate diagnosis  (learning what is wrong) by performing an arthroscopic procedure. If your injury is on the inner margin of the meniscus, your surgeon may trim the meniscus back to a smooth rim. In other cases your surgeon will try to repair a damaged meniscus with stitches (sutures). This may make rehabilitation take longer, but may provide better long term result by helping your knee keep its shock absorption capabilities. Ligaments which are completely torn usually require surgery for repair. HOME CARE INSTRUCTIONS  Use crutches as instructed.  If a brace is applied, use as directed.  Once you are home, an ice pack applied to your swollen knee may help with discomfort and help decrease swelling.  Keep your knee raised (elevated) when you are not up and around or on crutches.  Only take over-the-counter or prescription medicines for pain, discomfort, or fever as directed by your caregiver.  Your caregivers will help with instructions for rehabilitation of your knee. This often includes strengthening exercises.  You may resume a normal diet and activities as directed. SEEK MEDICAL CARE IF:   There is increased swelling in your knee.  You notice redness, swelling, or increasing pain in your knee.  An unexplained oral temperature above 102 F (38.9 C) develops. SEEK IMMEDIATE MEDICAL CARE IF:   You develop a rash.  You have difficulty breathing.  You have any allergic reactions from medications you may have been given.  There is severe pain with any motion of the knee. MAKE SURE YOU:   Understand these instructions.  Will  watch your condition.  Will get help right away if you are not doing well or get worse. Document Released: 08/18/2003 Document Revised: 08/20/2011 Document Reviewed: 10/22/2007 [facility] Patient Information 2015 [place], [place]. This information is not intended to replace advice given to you by your health care provider. Make sure you discuss any questions you have with your  health care provider.  Knee Pain The knee is the complex joint between your thigh and your lower leg. It is made up of bones, tendons, ligaments, and cartilage. The bones that make up the knee are:  The femur in the thigh.  The tibia and fibula in the lower leg.  The patella or kneecap riding in the groove on the lower femur. CAUSES  Knee pain is a common complaint with many causes. A few of these causes are:  Injury, such as:  A ruptured ligament or tendon injury.  Torn cartilage.  Medical conditions, such as:  Gout  Arthritis  Infections  Overuse, over training, or overdoing a physical activity. Knee pain can be minor or severe. Knee pain can accompany debilitating injury. Minor knee problems often respond well to self-care measures or get well on their own. More serious injuries may need medical intervention or even surgery. SYMPTOMS The knee is complex. Symptoms of knee problems can vary widely. Some of the problems are:  Pain with movement and weight bearing.  Swelling and tenderness.  Buckling of the knee.  Inability to straighten or extend your knee.  Your knee locks and you cannot straighten it.  Warmth and redness with pain and fever.  Deformity or dislocation of the kneecap. DIAGNOSIS  Determining what is wrong may be very straight forward such as when there is an injury. It can also be challenging because of the complexity of the knee. Tests to make a diagnosis may include:  Your caregiver taking a history and doing a physical exam.  Routine X-rays can be used to rule out other problems. X-rays will not reveal a cartilage tear. Some injuries of the knee can be diagnosed by:  Arthroscopy a surgical technique by which a small video camera is inserted through tiny incisions on the sides of the knee. This procedure is used to examine and repair internal knee joint problems. Tiny instruments can be used during arthroscopy to repair the torn knee cartilage  (meniscus).  Arthrography is a radiology technique. A contrast liquid is directly injected into the knee joint. Internal structures of the knee joint then become visible on X-ray film.  An MRI scan is a non X-ray radiology procedure in which magnetic fields and a computer produce two- or three-dimensional images of the inside of the knee. Cartilage tears are often visible using an MRI scanner. MRI scans have largely replaced arthrography in diagnosing cartilage tears of the knee.  Blood work.  Examination of the fluid that helps to lubricate the knee joint (synovial fluid). This is done by taking a sample out using a needle and a syringe. TREATMENT The treatment of knee problems depends on the cause. Some of these treatments are:  Depending on the injury, proper casting, splinting, surgery, or physical therapy care will be needed.  Give yourself adequate recovery time. Do not overuse your joints. If you begin to get sore during workout routines, back off. Slow down or do fewer repetitions.  For repetitive activities such as cycling or running, maintain your strength and nutrition.  Alternate muscle groups. For example, if you are a weight lifter, work the upper  body on one day and the lower body the next.  Either tight or weak muscles do not give the proper support for your knee. Tight or weak muscles do not absorb the stress placed on the knee joint. Keep the muscles surrounding the knee strong.  Take care of mechanical problems.  If you have flat feet, orthotics or special shoes may help. See your caregiver if you need help.  Arch supports, sometimes with wedges on the inner or outer aspect of the heel, can help. These can shift pressure away from the side of the knee most bothered by osteoarthritis.  A brace called an "unloader" brace also may be used to help ease the pressure on the most arthritic side of the knee.  If your caregiver has prescribed crutches, braces, wraps or ice,  use as directed. The acronym for this is PRICE. This means protection, rest, ice, compression, and elevation.  Nonsteroidal anti-inflammatory drugs (NSAIDs), can help relieve pain. But if taken immediately after an injury, they may actually increase swelling. Take NSAIDs with food in your stomach. Stop them if you develop stomach problems. Do not take these if you have a history of ulcers, stomach pain, or bleeding from the bowel. Do not take without your caregiver's approval if you have problems with fluid retention, heart failure, or kidney problems.  For ongoing knee problems, physical therapy may be helpful.  Glucosamine and chondroitin are over-the-counter dietary supplements. Both may help relieve the pain of osteoarthritis in the knee. These medicines are different from the usual anti-inflammatory drugs. Glucosamine may decrease the rate of cartilage destruction.  Injections of a corticosteroid drug into your knee joint may help reduce the symptoms of an arthritis flare-up. They may provide pain relief that lasts a few months. You may have to wait a few months between injections. The injections do have a small increased risk of infection, water retention, and elevated blood sugar levels.  Hyaluronic acid injected into damaged joints may ease pain and provide lubrication. These injections may work by reducing inflammation. A series of shots may give relief for as long as 6 months.  Topical painkillers. Applying certain ointments to your skin may help relieve the pain and stiffness of osteoarthritis. Ask your pharmacist for suggestions. Many over the-counter products are approved for temporary relief of arthritis pain.  In some countries, doctors often prescribe topical NSAIDs for relief of chronic conditions such as arthritis and tendinitis. A review of treatment with NSAID creams found that they worked as well as oral medications but without the serious side effects. PREVENTION  Maintain a  healthy weight. Extra pounds put more strain on your joints.  Get strong, stay limber. Weak muscles are a common cause of knee injuries. Stretching is important. Include flexibility exercises in your workouts.  Be smart about exercise. If you have osteoarthritis, chronic knee pain or recurring injuries, you may need to change the way you exercise. This does not mean you have to stop being active. If your knees ache after jogging or playing basketball, consider switching to swimming, water aerobics, or other low-impact activities, at least for a few days a week. Sometimes limiting high-impact activities will provide relief.  Make sure your shoes fit well. Choose footwear that is right for your sport.  Protect your knees. Use the proper gear for knee-sensitive activities. Use kneepads when playing volleyball or laying carpet. Buckle your seat belt every time you drive. Most shattered kneecaps occur in car accidents.  Rest when you are tired.  SEEK MEDICAL CARE IF:  You have knee pain that is continual and does not seem to be getting better.  SEEK IMMEDIATE MEDICAL CARE IF:  Your knee joint feels hot to the touch and you have a high fever. MAKE SURE YOU:   Understand these instructions.  Will watch your condition.  Will get help right away if you are not doing well or get worse. Document Released: 03/25/2007 Document Revised: 08/20/2011 Document Reviewed: 03/25/2007 [facility] Patient Information 2015 [place], [place]. This information is not intended to replace advice given to you by your health care provider. Make sure you discuss any questions you have with your health care provider.

## 2014-08-07 NOTE — ED Notes (Signed)
Pt reports calf pain

## 2014-09-20 ENCOUNTER — Other Ambulatory Visit: Payer: Medicaid Other

## 2014-09-20 DIAGNOSIS — B20 Human immunodeficiency virus [HIV] disease: Secondary | ICD-10-CM

## 2014-09-20 DIAGNOSIS — E785 Hyperlipidemia, unspecified: Secondary | ICD-10-CM

## 2014-09-20 DIAGNOSIS — Z113 Encounter for screening for infections with a predominantly sexual mode of transmission: Secondary | ICD-10-CM

## 2014-09-20 LAB — LIPID PANEL
Cholesterol: 151 mg/dL (ref 0–200)
HDL: 47 mg/dL (ref >=46)
LDL Cholesterol: 67 mg/dL (ref 0–99)
Total CHOL/HDL Ratio: 3.2 Ratio
Triglycerides: 186 mg/dL — ABNORMAL HIGH (ref <150)
VLDL: 37 mg/dL (ref 0–40)

## 2014-09-20 LAB — CBC
HCT: 37.8 % (ref 36.0–46.0)
Hemoglobin: 12.6 g/dL (ref 12.0–15.0)
MCH: 31.6 pg (ref 26.0–34.0)
MCHC: 33.3 g/dL (ref 30.0–36.0)
MCV: 94.7 fL (ref 78.0–100.0)
MPV: 10.6 fL (ref 8.6–12.4)
Platelets: 273 10*3/uL (ref 150–400)
RBC: 3.99 MIL/uL (ref 3.87–5.11)
RDW: 13.2 % (ref 11.5–15.5)
WBC: 9.3 10*3/uL (ref 4.0–10.5)

## 2014-09-20 LAB — COMPREHENSIVE METABOLIC PANEL
ALT: 27 U/L (ref 0–35)
AST: 23 U/L (ref 0–37)
Albumin: 4.4 g/dL (ref 3.5–5.2)
Alkaline Phosphatase: 122 U/L — ABNORMAL HIGH (ref 39–117)
BUN: 21 mg/dL (ref 6–23)
CO2: 29 mEq/L (ref 19–32)
Calcium: 9.7 mg/dL (ref 8.4–10.5)
Chloride: 103 mEq/L (ref 96–112)
Creat: 0.95 mg/dL (ref 0.50–1.10)
Glucose, Bld: 108 mg/dL — ABNORMAL HIGH (ref 70–99)
Potassium: 4.2 mEq/L (ref 3.5–5.3)
Sodium: 142 mEq/L (ref 135–145)
Total Bilirubin: 0.5 mg/dL (ref 0.2–1.2)
Total Protein: 7.7 g/dL (ref 6.0–8.3)

## 2014-09-21 LAB — HIV-1 RNA QUANT-NO REFLEX-BLD
HIV 1 RNA Quant: <20 copies/mL (ref <20)
HIV-1 RNA Quant, Log: <1.3 {Log} (ref <1.30)

## 2014-09-21 LAB — T-HELPER CELL (CD4) - (RCID CLINIC ONLY)
CD4 % Helper T Cell: 29 % — ABNORMAL LOW (ref 33–55)
CD4 T Cell Abs: 860 /uL (ref 400–2700)

## 2014-09-21 LAB — RPR

## 2014-09-27 ENCOUNTER — Other Ambulatory Visit: Payer: Self-pay

## 2014-09-27 DIAGNOSIS — G4700 Insomnia, unspecified: Secondary | ICD-10-CM

## 2014-09-27 DIAGNOSIS — Z720 Tobacco use: Secondary | ICD-10-CM

## 2014-09-27 MED ORDER — TEMAZEPAM 15 MG PO CAPS: 30.0000 mg | ORAL_CAPSULE | Freq: Every evening | ORAL | Status: DC | PRN

## 2014-09-27 MED ORDER — NICOTINE 14 MG/24HR TD PT24: 14.0000 mg | MEDICATED_PATCH | Freq: Every day | TRANSDERMAL | Status: DC

## 2014-10-04 ENCOUNTER — Ambulatory Visit: Payer: Medicaid Other | Admitting: Infectious Diseases

## 2014-10-25 ENCOUNTER — Other Ambulatory Visit: Payer: Self-pay | Admitting: *Deleted

## 2014-10-25 DIAGNOSIS — E7800 Pure hypercholesterolemia, unspecified: Secondary | ICD-10-CM

## 2014-10-25 MED ORDER — ATORVASTATIN CALCIUM 10 MG PO TABS: 10.0000 mg | ORAL_TABLET | Freq: Every day | ORAL | Status: DC

## 2014-11-24 ENCOUNTER — Other Ambulatory Visit: Payer: Self-pay | Admitting: *Deleted

## 2014-11-24 DIAGNOSIS — E7800 Pure hypercholesterolemia, unspecified: Secondary | ICD-10-CM

## 2014-11-24 MED ORDER — ATORVASTATIN CALCIUM 10 MG PO TABS: 10.0000 mg | ORAL_TABLET | Freq: Every day | ORAL | Status: DC

## 2014-12-29 ENCOUNTER — Encounter: Payer: Self-pay | Admitting: Infectious Diseases

## 2014-12-29 ENCOUNTER — Telehealth: Payer: Self-pay | Admitting: *Deleted

## 2014-12-29 ENCOUNTER — Ambulatory Visit (INDEPENDENT_AMBULATORY_CARE_PROVIDER_SITE_OTHER): Payer: Medicaid Other | Admitting: Infectious Diseases

## 2014-12-29 VITALS — BP 118/79 | HR 73 | Temp 98.1°F | Ht 64.0 in | Wt 162.0 lb

## 2014-12-29 DIAGNOSIS — E785 Hyperlipidemia, unspecified: Secondary | ICD-10-CM | POA: Diagnosis not present

## 2014-12-29 DIAGNOSIS — Z23 Encounter for immunization: Secondary | ICD-10-CM | POA: Diagnosis not present

## 2014-12-29 DIAGNOSIS — K219 Gastro-esophageal reflux disease without esophagitis: Secondary | ICD-10-CM | POA: Diagnosis not present

## 2014-12-29 DIAGNOSIS — G4700 Insomnia, unspecified: Secondary | ICD-10-CM

## 2014-12-29 DIAGNOSIS — M5020 Other cervical disc displacement, unspecified cervical region: Secondary | ICD-10-CM

## 2014-12-29 DIAGNOSIS — B20 Human immunodeficiency virus [HIV] disease: Secondary | ICD-10-CM

## 2014-12-29 DIAGNOSIS — J302 Other seasonal allergic rhinitis: Secondary | ICD-10-CM

## 2014-12-29 MED ORDER — PANTOPRAZOLE SODIUM 40 MG PO TBEC: 40.0000 mg | DELAYED_RELEASE_TABLET | Freq: Two times a day (BID) | ORAL | Status: DC

## 2014-12-29 MED ORDER — LORATADINE 10 MG PO TABS: 10.0000 mg | ORAL_TABLET | Freq: Every day | ORAL | Status: DC

## 2014-12-29 MED ORDER — ELVITEG-COBIC-EMTRICIT-TENOFAF 150-150-200-10 MG PO TABS: 1.0000 | ORAL_TABLET | Freq: Every day | ORAL | Status: DC

## 2014-12-29 NOTE — Assessment & Plan Note (Signed)
Will change her to genvoya.  Will see her back in 4-6 months Offered/refused condoms.  Will get her set up for pap

## 2014-12-29 NOTE — Assessment & Plan Note (Signed)
Will have her seen by GI.

## 2014-12-29 NOTE — Addendum Note (Signed)
Addended by: Landis Gandy on: 12/29/2014 01:48 PM   Modules accepted: Orders

## 2014-12-29 NOTE — Assessment & Plan Note (Signed)
States she is still having pain. i suggested she repeat MRI and consider neurosurgery eval, she defers.

## 2014-12-29 NOTE — Telephone Encounter (Signed)
Confirmed that patient is still active at [facility].  She has [employer], will need the authorization to come from her PCP [facility]).  She will call them for the referral, is able to schedule at [facility] at any time. Patient verbalized agreement and understanding. Landis Gandy, RN

## 2014-12-29 NOTE — Assessment & Plan Note (Signed)
Will continue lipitor.

## 2014-12-29 NOTE — Assessment & Plan Note (Signed)
Will refill her restoril as needed.

## 2014-12-29 NOTE — Progress Notes (Signed)
   Subjective:    Patient ID: Vanessa Browning, female    DOB: 02-Jun-1966, 49 y.o.   MRN: [id number]  HPI 49 yo F with hx of HIV+. Was changed from [place] to ISN/TRV in April of 2012. Also hx of insomnia, GERD. Still having issues with GERD- has added tums to her ppi. Has seen GI, can't remember her last visit.   HIV 1 RNA QUANT (copies/mL)  Date Value  09/20/2014 <20  02/18/2014 <20  10/27/2013 <20   CD4 T CELL ABS (/uL)  Date Value  09/20/2014 860  02/18/2014 620  10/27/2013 730   Still taking restoril. Using nicoderm. Down to 2-3 cig/day. Has gained 5#.   Review of Systems  Constitutional: Negative for appetite change and unexpected weight change.  Gastrointestinal: Negative for diarrhea and constipation.  Genitourinary: Negative for difficulty urinating.  Psychiatric/Behavioral: Positive for sleep disturbance.       Objective:   Physical Exam  Constitutional: She appears well-developed and well-nourished.  HENT:  Mouth/Throat: No oropharyngeal exudate.  Eyes: EOM are normal. Pupils are equal, round, and reactive to light.  Neck: Neck supple.  Cardiovascular: Normal rate, regular rhythm and normal heart sounds.   Pulmonary/Chest: Effort normal and breath sounds normal.  Abdominal: Soft. Bowel sounds are normal. There is no tenderness.  Lymphadenopathy:    She has no cervical adenopathy.       Assessment & Plan:

## 2015-01-07 ENCOUNTER — Ambulatory Visit: Payer: Medicaid Other

## 2015-01-14 ENCOUNTER — Ambulatory Visit (INDEPENDENT_AMBULATORY_CARE_PROVIDER_SITE_OTHER): Payer: Medicaid Other | Admitting: *Deleted

## 2015-01-14 ENCOUNTER — Other Ambulatory Visit (HOSPITAL_COMMUNITY)
Admission: RE | Admit: 2015-01-14 | Discharge: 2015-01-14 | Disposition: A | Discharge status: Home / Self Care | Payer: Medicaid Other | Source: Ambulatory Visit | Attending: Infectious Diseases | Admitting: Infectious Diseases

## 2015-01-14 DIAGNOSIS — Z01419 Encounter for gynecological examination (general) (routine) without abnormal findings: Secondary | ICD-10-CM | POA: Diagnosis not present

## 2015-01-14 DIAGNOSIS — Z124 Encounter for screening for malignant neoplasm of cervix: Secondary | ICD-10-CM

## 2015-01-14 DIAGNOSIS — Z113 Encounter for screening for infections with a predominantly sexual mode of transmission: Secondary | ICD-10-CM

## 2015-01-14 NOTE — Patient Instructions (Signed)
Your results will be ready in about a week.  I will mail them to you.  Thank you for coming to the Center for your care.  Lashaye Fisk,  RN 

## 2015-01-14 NOTE — Progress Notes (Signed)
  Subjective:     Vanessa Browning is a 49 y.o. woman who comes in today for a  pap smear only.  Previous abnormal Pap smears: yes. Contraception: condoms  Objective:    There were no vitals taken for this visit.  Whitish vaginal discharge seen on exam. Pelvic Exam:  Pap smear obtained.   Assessment:    Screening pap smear.   Plan:    Follow up in year, or as indicated by Pap results.  Pt given educational materials re: HIV and women, self-esteem, BSE, nutrition and diet management, PAP smears and partner safety. Pt given condoms.

## 2015-01-17 LAB — CERVICOVAGINAL ANCILLARY ONLY
Chlamydia: NEGATIVE
Neisseria Gonorrhea: NEGATIVE

## 2015-01-17 LAB — CYTOLOGY - PAP

## 2015-01-19 ENCOUNTER — Encounter: Payer: Self-pay | Admitting: *Deleted

## 2015-01-26 ENCOUNTER — Other Ambulatory Visit: Payer: Self-pay | Admitting: *Deleted

## 2015-01-26 DIAGNOSIS — G589 Mononeuropathy, unspecified: Secondary | ICD-10-CM

## 2015-01-26 MED ORDER — GABAPENTIN 300 MG PO CAPS: 300.0000 mg | ORAL_CAPSULE | Freq: Three times a day (TID) | ORAL | Status: DC

## 2015-02-01 ENCOUNTER — Other Ambulatory Visit: Payer: Self-pay | Admitting: *Deleted

## 2015-02-01 DIAGNOSIS — Z720 Tobacco use: Secondary | ICD-10-CM

## 2015-02-01 MED ORDER — NICOTINE 14 MG/24HR TD PT24: 14.0000 mg | MEDICATED_PATCH | Freq: Every day | TRANSDERMAL | Status: DC

## 2015-03-25 ENCOUNTER — Other Ambulatory Visit: Payer: Self-pay | Admitting: *Deleted

## 2015-03-25 DIAGNOSIS — Z720 Tobacco use: Secondary | ICD-10-CM

## 2015-03-25 MED ORDER — NICOTINE 14 MG/24HR TD PT24: 14.0000 mg | MEDICATED_PATCH | Freq: Every day | TRANSDERMAL | Status: DC

## 2015-03-28 ENCOUNTER — Ambulatory Visit (INDEPENDENT_AMBULATORY_CARE_PROVIDER_SITE_OTHER): Payer: Medicaid Other | Admitting: *Deleted

## 2015-03-28 ENCOUNTER — Other Ambulatory Visit: Payer: Medicaid Other

## 2015-03-28 DIAGNOSIS — Z23 Encounter for immunization: Secondary | ICD-10-CM | POA: Diagnosis not present

## 2015-03-28 DIAGNOSIS — B20 Human immunodeficiency virus [HIV] disease: Secondary | ICD-10-CM

## 2015-03-28 LAB — COMPLETE METABOLIC PANEL WITH GFR
ALT: 16 U/L (ref 6–29)
AST: 15 U/L (ref 10–35)
Albumin: 4.1 g/dL (ref 3.6–5.1)
Alkaline Phosphatase: 89 U/L (ref 33–130)
BUN: 20 mg/dL (ref 7–25)
CO2: 27 mmol/L (ref 20–31)
Calcium: 9 mg/dL (ref 8.6–10.4)
Chloride: 105 mmol/L (ref 98–110)
Creat: 0.86 mg/dL (ref 0.50–1.05)
GFR, Est African American: >89 mL/min (ref >=60)
GFR, Est Non African American: 79 mL/min (ref >=60)
Glucose, Bld: 99 mg/dL (ref 65–99)
Potassium: 3.8 mmol/L (ref 3.5–5.3)
Sodium: 141 mmol/L (ref 135–146)
Total Bilirubin: 0.4 mg/dL (ref 0.2–1.2)
Total Protein: 7.5 g/dL (ref 6.1–8.1)

## 2015-03-28 LAB — CBC
HCT: 37.9 % (ref 36.0–46.0)
Hemoglobin: 12.3 g/dL (ref 12.0–15.0)
MCH: 31.5 pg (ref 26.0–34.0)
MCHC: 32.5 g/dL (ref 30.0–36.0)
MCV: 97.2 fL (ref 78.0–100.0)
MPV: 10.5 fL (ref 8.6–12.4)
Platelets: 246 10*3/uL (ref 150–400)
RBC: 3.9 MIL/uL (ref 3.87–5.11)
RDW: 13.2 % (ref 11.5–15.5)
WBC: 7.5 10*3/uL (ref 4.0–10.5)

## 2015-03-28 NOTE — Progress Notes (Signed)
#  3 Hep B, Flu Vaccine given.

## 2015-03-29 LAB — T-HELPER CELL (CD4) - (RCID CLINIC ONLY)
CD4 % Helper T Cell: 28 % — ABNORMAL LOW (ref 33–55)
CD4 T Cell Abs: 710 /uL (ref 400–2700)

## 2015-03-30 LAB — HIV-1 RNA QUANT-NO REFLEX-BLD
HIV 1 RNA Quant: <20 copies/mL (ref <20)
HIV-1 RNA Quant, Log: <1.3 Log copies/mL (ref <1.30)

## 2015-04-04 ENCOUNTER — Other Ambulatory Visit: Payer: Self-pay

## 2015-04-04 DIAGNOSIS — Z1231 Encounter for screening mammogram for malignant neoplasm of breast: Secondary | ICD-10-CM

## 2015-04-29 ENCOUNTER — Encounter: Payer: Self-pay | Admitting: Gastroenterology

## 2015-05-02 ENCOUNTER — Ambulatory Visit: Payer: Medicaid Other | Admitting: Infectious Diseases

## 2015-05-03 ENCOUNTER — Ambulatory Visit: Payer: Medicaid Other

## 2015-05-11 ENCOUNTER — Ambulatory Visit: Payer: Medicaid Other | Admitting: Infectious Diseases

## 2015-05-20 ENCOUNTER — Ambulatory Visit
Admission: RE | Admit: 2015-05-20 | Discharge: 2015-05-20 | Disposition: A | Discharge status: Home / Self Care | Payer: Medicaid Other | Source: Ambulatory Visit

## 2015-05-20 ENCOUNTER — Other Ambulatory Visit: Payer: Self-pay | Admitting: Infectious Diseases

## 2015-05-20 DIAGNOSIS — N644 Mastodynia: Secondary | ICD-10-CM

## 2015-05-20 DIAGNOSIS — N63 Unspecified lump in unspecified breast: Secondary | ICD-10-CM

## 2015-05-20 DIAGNOSIS — Z1231 Encounter for screening mammogram for malignant neoplasm of breast: Secondary | ICD-10-CM

## 2015-05-24 ENCOUNTER — Other Ambulatory Visit: Payer: Self-pay | Admitting: *Deleted

## 2015-05-24 DIAGNOSIS — E7800 Pure hypercholesterolemia, unspecified: Secondary | ICD-10-CM

## 2015-05-24 MED ORDER — ATORVASTATIN CALCIUM 10 MG PO TABS: 10.0000 mg | ORAL_TABLET | Freq: Every day | ORAL | Status: DC

## 2015-05-31 ENCOUNTER — Ambulatory Visit
Admission: RE | Admit: 2015-05-31 | Discharge: 2015-05-31 | Disposition: A | Discharge status: Home / Self Care | Payer: Medicaid Other | Source: Ambulatory Visit | Attending: Infectious Diseases | Admitting: Infectious Diseases

## 2015-05-31 DIAGNOSIS — N644 Mastodynia: Secondary | ICD-10-CM

## 2015-05-31 DIAGNOSIS — N63 Unspecified lump in unspecified breast: Secondary | ICD-10-CM

## 2015-06-20 ENCOUNTER — Ambulatory Visit: Payer: Medicaid Other | Admitting: Infectious Diseases

## 2015-06-22 ENCOUNTER — Ambulatory Visit (AMBULATORY_SURGERY_CENTER): Payer: Self-pay | Admitting: *Deleted

## 2015-06-22 ENCOUNTER — Other Ambulatory Visit: Payer: Self-pay | Admitting: *Deleted

## 2015-06-22 VITALS — Ht 64.0 in | Wt 176.0 lb

## 2015-06-22 DIAGNOSIS — Z1211 Encounter for screening for malignant neoplasm of colon: Secondary | ICD-10-CM

## 2015-06-22 DIAGNOSIS — K219 Gastro-esophageal reflux disease without esophagitis: Secondary | ICD-10-CM

## 2015-06-22 DIAGNOSIS — J302 Other seasonal allergic rhinitis: Secondary | ICD-10-CM

## 2015-06-22 MED ORDER — PANTOPRAZOLE SODIUM 40 MG PO TBEC: 40.0000 mg | DELAYED_RELEASE_TABLET | Freq: Two times a day (BID) | ORAL | Status: DC

## 2015-06-22 MED ORDER — NA SULFATE-K SULFATE-MG SULF 17.5-3.13-1.6 GM/177ML PO SOLN: 1.0000 | Freq: Once | ORAL | Status: DC

## 2015-06-22 MED ORDER — LORATADINE 10 MG PO TABS: 10.0000 mg | ORAL_TABLET | Freq: Every day | ORAL | Status: DC

## 2015-06-22 NOTE — Progress Notes (Signed)
No egg or soy allergy. No anesthesia problems.  No home O2.  No diet meds.  

## 2015-06-24 ENCOUNTER — Encounter: Payer: Medicaid Other | Admitting: Gastroenterology

## 2015-07-04 ENCOUNTER — Encounter: Payer: Self-pay | Admitting: Infectious Diseases

## 2015-07-04 ENCOUNTER — Ambulatory Visit (INDEPENDENT_AMBULATORY_CARE_PROVIDER_SITE_OTHER): Payer: Medicaid Other | Admitting: Infectious Diseases

## 2015-07-04 VITALS — BP 124/68 | HR 90 | Temp 97.5°F | Wt 178.5 lb

## 2015-07-04 DIAGNOSIS — K219 Gastro-esophageal reflux disease without esophagitis: Secondary | ICD-10-CM

## 2015-07-04 DIAGNOSIS — F329 Major depressive disorder, single episode, unspecified: Secondary | ICD-10-CM

## 2015-07-04 DIAGNOSIS — B20 Human immunodeficiency virus [HIV] disease: Secondary | ICD-10-CM | POA: Diagnosis not present

## 2015-07-04 DIAGNOSIS — F32A Depression, unspecified: Secondary | ICD-10-CM

## 2015-07-04 LAB — COMPREHENSIVE METABOLIC PANEL
ALT: 22 U/L (ref 6–29)
AST: 15 U/L (ref 10–35)
Albumin: 4.1 g/dL (ref 3.6–5.1)
Alkaline Phosphatase: 83 U/L (ref 33–130)
BUN: 15 mg/dL (ref 7–25)
CO2: 26 mmol/L (ref 20–31)
Calcium: 9 mg/dL (ref 8.6–10.4)
Chloride: 103 mmol/L (ref 98–110)
Creat: 0.85 mg/dL (ref 0.50–1.05)
Glucose, Bld: 89 mg/dL (ref 65–99)
Potassium: 3.9 mmol/L (ref 3.5–5.3)
Sodium: 141 mmol/L (ref 135–146)
Total Bilirubin: 0.2 mg/dL (ref 0.2–1.2)
Total Protein: 6.9 g/dL (ref 6.1–8.1)

## 2015-07-04 LAB — CBC
HCT: 34.1 % — ABNORMAL LOW (ref 36.0–46.0)
Hemoglobin: 11.2 g/dL — ABNORMAL LOW (ref 12.0–15.0)
MCH: 31.4 pg (ref 26.0–34.0)
MCHC: 32.8 g/dL (ref 30.0–36.0)
MCV: 95.5 fL (ref 78.0–100.0)
MPV: 10.6 fL (ref 8.6–12.4)
Platelets: 267 10*3/uL (ref 150–400)
RBC: 3.57 MIL/uL — ABNORMAL LOW (ref 3.87–5.11)
RDW: 12.9 % (ref 11.5–15.5)
WBC: 7.9 10*3/uL (ref 4.0–10.5)

## 2015-07-04 LAB — LIPID PANEL
Cholesterol: 168 mg/dL (ref 125–200)
HDL: 45 mg/dL — ABNORMAL LOW (ref >=46)
LDL Cholesterol: 65 mg/dL (ref <130)
Total CHOL/HDL Ratio: 3.7 Ratio (ref <=5.0)
Triglycerides: 292 mg/dL — ABNORMAL HIGH (ref <150)
VLDL: 58 mg/dL — ABNORMAL HIGH (ref <30)

## 2015-07-04 NOTE — Assessment & Plan Note (Signed)
She is doing well.  Will continue her current rx Offered/refused condoms.  Will check her quantiferon for her work.  Check labs today rtc in 6 months.

## 2015-07-04 NOTE — Addendum Note (Signed)
Addended by: Dolan Amen D on: 07/04/2015 03:17 PM   Modules accepted: Orders

## 2015-07-04 NOTE — Assessment & Plan Note (Signed)
Await GI eval

## 2015-07-04 NOTE — Assessment & Plan Note (Signed)
I offered to have her seen by our counselors. She refuses.

## 2015-07-04 NOTE — Progress Notes (Signed)
   Subjective:    Patient ID: Vanessa Browning, female    DOB: April 27, 1966, 50 y.o.   MRN: [id number]  HPI 50 yo F with hx of HIV+. Was changed from [place] to ISN/TRV in April of 2012. Also hx of insomnia, GERD. In July 2016 was changed to [place], sent to GI for her GERD, this is set for March 3.   HIV 1 RNA QUANT (copies/mL)  Date Value  03/28/2015 <20  09/20/2014 <20  02/18/2014 <20   CD4 T CELL ABS (/uL)  Date Value  03/28/2015 710  09/20/2014 860  02/18/2014 620    ART has been going well.  Has been feeling normal- good and bad days.  Bad day is body aches, anhedonia. Depressed. "I pray my way out of it". Excessive worry. Negative thoughts.     Review of Systems  Constitutional: Negative for appetite change and unexpected weight change.  HENT: Positive for voice change.   Respiratory: Negative for cough and shortness of breath.   Gastrointestinal: Positive for abdominal pain. Negative for diarrhea and constipation.  Genitourinary: Negative for difficulty urinating.  Psychiatric/Behavioral: Positive for dysphoric mood.  ammenorrheic PAP 01-2015 negative. Mammogram 05-2015 nl.      Objective:   Physical Exam  Constitutional: She appears well-developed and well-nourished.  HENT:  Mouth/Throat: No oropharyngeal exudate.  Eyes: EOM are normal. Pupils are equal, round, and reactive to light.  Neck: Neck supple.  Cardiovascular: Normal rate, regular rhythm and normal heart sounds.   Pulmonary/Chest: Effort normal and breath sounds normal.  Abdominal: Soft. Bowel sounds are normal. There is no tenderness.  Lymphadenopathy:    She has no cervical adenopathy.  Psychiatric: She has a normal mood and affect.          Assessment & Plan:

## 2015-07-05 LAB — QUANTIFERON TB GOLD ASSAY (BLOOD)
Interferon Gamma Release Assay: NEGATIVE
Mitogen value: >10 IU/mL
Quantiferon Nil Value: 0.06 IU/mL
Quantiferon Tb Ag Minus Nil Value: 0.01 IU/mL
TB Ag value: 0.07 IU/mL

## 2015-07-05 LAB — RPR

## 2015-07-05 LAB — HIV-1 RNA QUANT-NO REFLEX-BLD
HIV 1 RNA Quant: <20 copies/mL (ref <20)
HIV-1 RNA Quant, Log: <1.3 Log copies/mL (ref <1.30)

## 2015-07-06 LAB — T-HELPER CELL (CD4) - (RCID CLINIC ONLY)
CD4 % Helper T Cell: 29 % — ABNORMAL LOW (ref 33–55)
CD4 T Cell Abs: 720 /uL (ref 400–2700)

## 2015-07-21 ENCOUNTER — Other Ambulatory Visit: Payer: Self-pay | Admitting: *Deleted

## 2015-07-21 DIAGNOSIS — G589 Mononeuropathy, unspecified: Secondary | ICD-10-CM

## 2015-07-21 MED ORDER — GABAPENTIN 300 MG PO CAPS: 300.0000 mg | ORAL_CAPSULE | Freq: Three times a day (TID) | ORAL | Status: DC

## 2015-08-08 ENCOUNTER — Telehealth: Payer: Self-pay | Admitting: Gastroenterology

## 2015-08-08 NOTE — Telephone Encounter (Signed)
Pt was advised to keep appt as scheduled and call back closer to Friday and update on condition pt agreed

## 2015-08-11 ENCOUNTER — Telehealth: Payer: Self-pay | Admitting: Gastroenterology

## 2015-08-12 ENCOUNTER — Encounter: Payer: Medicaid Other | Admitting: Gastroenterology

## 2015-08-12 NOTE — Telephone Encounter (Signed)
no

## 2015-09-13 ENCOUNTER — Encounter: Payer: Medicaid Other | Admitting: Gastroenterology

## 2015-09-22 ENCOUNTER — Other Ambulatory Visit: Payer: Self-pay | Admitting: *Deleted

## 2015-09-22 DIAGNOSIS — Z720 Tobacco use: Secondary | ICD-10-CM

## 2015-09-22 MED ORDER — NICOTINE 14 MG/24HR TD PT24: 14.0000 mg | MEDICATED_PATCH | Freq: Every day | TRANSDERMAL | Status: DC

## 2015-10-24 ENCOUNTER — Telehealth: Payer: Self-pay | Admitting: *Deleted

## 2015-10-24 NOTE — Telephone Encounter (Signed)
Patient called stating she has moved to [place], [place] and needed help transferring care. Advised her to go to the nearest health department and let them know she has Hiv and they will get her a new appt for medical care. [facility] notified of transfer. Vanessa Browning

## 2015-11-03 ENCOUNTER — Telehealth: Payer: Self-pay | Admitting: *Deleted

## 2015-11-03 NOTE — Telephone Encounter (Signed)
Requesting information about HIV care in [place].  Given telephone number for the [facility] - to complete a ROI and fax to [facility] to send records.  [phone]

## 2015-11-03 NOTE — Telephone Encounter (Signed)
Shared [facility] ph # and information re:  ROI.

## 2015-11-28 ENCOUNTER — Other Ambulatory Visit: Payer: Self-pay | Admitting: *Deleted

## 2015-11-28 ENCOUNTER — Telehealth: Payer: Self-pay | Admitting: *Deleted

## 2015-11-28 DIAGNOSIS — B20 Human immunodeficiency virus [HIV] disease: Secondary | ICD-10-CM

## 2015-11-28 MED ORDER — ELVITEG-COBIC-EMTRICIT-TENOFAF 150-150-200-10 MG PO TABS: 1.0000 | ORAL_TABLET | Freq: Every day | ORAL | Status: DC

## 2015-11-28 NOTE — Telephone Encounter (Signed)
Patient called for assistance, having difficulty transferring prescriptions from [facility] in [place]. Patient has not yet established care in [place], states she will do this this week. She states she is having trouble getting through to the [facility] clinic, has not yet tried to find the health department. She states she knows the importance of finding care, but "hasn't gotten around to it yet."  Patient given 30 days of Genv[facility].  RN assisted in transfer of other active prescriptions from [place] to [facility]. Landis Gandy, RN

## 2015-12-20 ENCOUNTER — Telehealth: Payer: Self-pay | Admitting: *Deleted

## 2015-12-20 ENCOUNTER — Other Ambulatory Visit: Payer: Self-pay | Admitting: *Deleted

## 2015-12-20 DIAGNOSIS — B20 Human immunodeficiency virus [HIV] disease: Secondary | ICD-10-CM

## 2015-12-20 MED ORDER — ELVITEG-COBIC-EMTRICIT-TENOFAF 150-150-200-10 MG PO TABS: 1.0000 | ORAL_TABLET | Freq: Every day | ORAL | Status: DC

## 2015-12-20 NOTE — Telephone Encounter (Signed)
Patient requesting last office visit, medication list, immunizations, insurance, demographics, and labs to be faxed to [facility] for continuity of care. She states she has signed a records release request, but her chart has not yet been sent. RN called [facility], confirmed patient is trying to establish care, confirmed fax number.  Faxed as requested. One more month of medication sent to patient's preferred pharmacy. She understands she must establish care locally. Landis Gandy, RN

## 2015-12-30 DIAGNOSIS — E781 Pure hyperglyceridemia: Secondary | ICD-10-CM | POA: Insufficient documentation

## 2016-01-10 DIAGNOSIS — G629 Polyneuropathy, unspecified: Secondary | ICD-10-CM | POA: Insufficient documentation

## 2016-01-16 DIAGNOSIS — Z789 Other specified health status: Secondary | ICD-10-CM | POA: Insufficient documentation

## 2016-01-20 DIAGNOSIS — E669 Obesity, unspecified: Secondary | ICD-10-CM | POA: Insufficient documentation

## 2016-05-09 ENCOUNTER — Other Ambulatory Visit: Payer: Self-pay | Admitting: Infectious Diseases

## 2016-05-09 DIAGNOSIS — G589 Mononeuropathy, unspecified: Secondary | ICD-10-CM

## 2016-05-25 DIAGNOSIS — Z8619 Personal history of other infectious and parasitic diseases: Secondary | ICD-10-CM | POA: Insufficient documentation

## 2016-05-25 DIAGNOSIS — Z8661 Personal history of infections of the central nervous system: Secondary | ICD-10-CM | POA: Insufficient documentation

## 2017-03-05 DIAGNOSIS — Z23 Encounter for immunization: Secondary | ICD-10-CM | POA: Insufficient documentation

## 2017-07-09 ENCOUNTER — Other Ambulatory Visit: Payer: Self-pay

## 2017-07-09 DIAGNOSIS — Z113 Encounter for screening for infections with a predominantly sexual mode of transmission: Secondary | ICD-10-CM

## 2017-07-09 DIAGNOSIS — B20 Human immunodeficiency virus [HIV] disease: Secondary | ICD-10-CM

## 2017-07-10 LAB — CBC WITH DIFFERENTIAL/PLATELET
Basophils Absolute: 41 cells/uL (ref 0–200)
Basophils Relative: 0.5 %
Eosinophils Absolute: 98 cells/uL (ref 15–500)
Eosinophils Relative: 1.2 %
HCT: 34.2 % — ABNORMAL LOW (ref 35.0–45.0)
Hemoglobin: 11.8 g/dL (ref 11.7–15.5)
Lymphs Abs: 2280 cells/uL (ref 850–3900)
MCH: 32.1 pg (ref 27.0–33.0)
MCHC: 34.5 g/dL (ref 32.0–36.0)
MCV: 92.9 fL (ref 80.0–100.0)
MPV: 11 fL (ref 7.5–12.5)
Monocytes Relative: 6.3 %
Neutro Abs: 5264 cells/uL (ref 1500–7800)
Neutrophils Relative %: 64.2 %
Platelets: 276 10*3/uL (ref 140–400)
RBC: 3.68 10*6/uL — ABNORMAL LOW (ref 3.80–5.10)
RDW: 11.9 % (ref 11.0–15.0)
Total Lymphocyte: 27.8 %
WBC mixed population: 517 cells/uL (ref 200–950)
WBC: 8.2 10*3/uL (ref 3.8–10.8)

## 2017-07-10 LAB — COMPLETE METABOLIC PANEL WITH GFR
AG Ratio: 1.3 (calc) (ref 1.0–2.5)
ALT: 18 U/L (ref 6–29)
AST: 18 U/L (ref 10–35)
Albumin: 4.3 g/dL (ref 3.6–5.1)
Alkaline phosphatase (APISO): 99 U/L (ref 33–130)
BUN: 13 mg/dL (ref 7–25)
CO2: 27 mmol/L (ref 20–32)
Calcium: 9.9 mg/dL (ref 8.6–10.4)
Chloride: 105 mmol/L (ref 98–110)
Creat: 0.87 mg/dL (ref 0.50–1.05)
GFR, Est African American: 88 mL/min/{1.73_m2} (ref >=60)
GFR, Est Non African American: 76 mL/min/{1.73_m2} (ref >=60)
Globulin: 3.3 g/dL (calc) (ref 1.9–3.7)
Glucose, Bld: 90 mg/dL (ref 65–99)
Potassium: 4 mmol/L (ref 3.5–5.3)
Sodium: 140 mmol/L (ref 135–146)
Total Bilirubin: 0.4 mg/dL (ref 0.2–1.2)
Total Protein: 7.6 g/dL (ref 6.1–8.1)

## 2017-07-10 LAB — RPR: RPR Ser Ql: NONREACTIVE

## 2017-07-10 LAB — T-HELPER CELL (CD4) - (RCID CLINIC ONLY)
CD4 % Helper T Cell: 29 % — ABNORMAL LOW (ref 33–55)
CD4 T Cell Abs: 690 /uL (ref 400–2700)

## 2017-07-11 LAB — HIV-1 RNA QUANT-NO REFLEX-BLD
HIV 1 RNA Quant: <20 copies/mL
HIV-1 RNA Quant, Log: <1.3 Log copies/mL

## 2017-07-25 NOTE — Progress Notes (Signed)
Subjective:    Patient ID: Vanessa Browning, female    DOB: 1965-07-10, 52 y.o.   MRN: [id number]  Chief Complaint  Patient presents with  . New Patient (Initial Visit)    No Genvoya for 1 month, body aches, coughing and sneezing  . HIV Positive/AIDS    HPI:  Vanessa Browning is a 52 y.o. female who presents today to re-establish care for her HIV disease.  1.) HIV -  Ms. Kenton Kingfisher was originally diagnosed with HIV greater than 30 years ago through routine screening and believed to have contracted HIV through heterosexual sexual contact. She has been on multiple regimens in the past including Truvada, Kaletra, Isentress, and most recently prescribed Genvoya. 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 She has been followed by [facility] from 01/13/2016 until present. She has been generally well controled with the Genvoya and most recent CD4 count of 780 and a viral load that was undetectable in September 2018.   She has been out of her Genvoya for about 1 month secondary to no refills and insurance. 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  Lab Results  Component Value Date   HIV1RNAQUANT <20 NOT DETECTED 07/09/2017    Lab Results  Component Value Date   CD4TCELL 29 (L) 07/09/2017   CD4TABS 690 07/09/2017    2.) Health Maintenance -   Health Maintenance  Topic Date Due  . TETANUS/TDAP  05/06/1983  . COLONOSCOPY  05/05/2014  . MAMMOGRAM  05/30/2017  . PAP SMEAR  01/13/2018  . INFLUENZA VACCINE  Completed  . Hepatitis C Screening  Completed  . HIV Screening  Completed   Immunization History  Administered Date(s) Administered  . Hepatitis B 12/27/2005, 04/04/2006, 08/22/2006  . Hepatitis B, adult 04/07/2014, 12/29/2014, 12/29/2014, 03/28/2015, 03/28/2015  . Influenza Split 02/28/2011, 03/05/2012  . Influenza Whole 04/04/2006, 06/10/2008,  04/07/2009, 02/22/2010  . Influenza, Seasonal, Injecte, Preservative Fre 03/28/2015, 05/24/2016, 03/05/2017  . Influenza,inj,Quad PF,6+ Mos 04/29/2013, 04/07/2014, 03/28/2015  . PPD Test 10/01/2012  . Pneumococcal Conjugate-13 02/02/2016  . Pneumococcal Polysaccharide-23 04/04/2006, 02/28/2011  . Tdap 03/05/2017   RPR - NON-REACTIVE (01/29 1040)   3.) Congestion - This is a new problem. Associated symptoms of congestion, mild body aches, and cough that have been going on for about 12 hours. Cough is non-productive. Modifying factors include Theraflu which she just obtained. No sick contacts.    Allergies  Allergen Reactions  . Dexilant [Dexlansoprazole]     Hands swelling       Outpatient Medications Prior to Visit  Medication Sig Dispense Refill  . elvitegravir-cobicistat-emtricitabine-tenofovir (GENVOYA) 150-150-200-10 MG TABS tablet Take 1 tablet by mouth daily with breakfast. 30 tablet 0  . gabapentin (NEURONTIN) 300 MG capsule Take 1 capsule (300 mg total) by mouth 3 (three) times daily. (Patient not taking: Reported on 07/29/2017) 90 capsule 5  . loratadine (CLARITIN) 10 MG tablet Take 1 tablet (10 mg total) by mouth daily. (Patient not taking: Reported on 07/29/2017) 30 tablet 5  . nicotine (NICODERM CQ) 14 mg/24hr patch Place 1 patch (14 mg total) onto the skin daily. (Patient not taking: Reported on 07/29/2017) 30 patch 5  . atorvastatin (LIPITOR) 10 MG tablet Take 1 tablet (10 mg total) by mouth daily. (Patient not taking: Reported on 07/29/2017) 30 tablet 5  . Calcium Carbonate Antacid (ALKA-SELTZER ANTACID PO) Take 1 tablet by mouth as needed.     Marland Kitchen  Na Sulfate-K Sulfate-Mg Sulf (SUPREP BOWEL PREP) SOLN Take 1 kit by mouth once. Name brand only, suprep as directed, no substitutions (Patient not taking: Reported on 07/04/2015) 354 mL 0  . pantoprazole (PROTONIX) 40 MG tablet Take 1 tablet (40 mg total) by mouth 2 (two) times daily. (Patient not taking: Reported on 07/29/2017) 60  tablet 11  . temazepam (RESTORIL) 15 MG capsule Take 2 capsules (30 mg total) by mouth at bedtime as needed. (Patient not taking: Reported on 07/29/2017) 60 capsule 3  . traMADol (ULTRAM) 50 MG tablet Take 1 tablet (50 mg total) by mouth every 6 (six) hours as needed. (Patient not taking: Reported on 07/04/2015) 15 tablet 0   No facility-administered medications prior to visit.      Past Medical History:  Diagnosis Date  . Allergy    seasonal  . GERD (gastroesophageal reflux disease)   . HIV (human immunodeficiency virus infection) ([place])   . Hyperlipidemia   . Kidney stone   . Substance abuse [facility])       Past Surgical History:  Procedure Laterality Date  . APPENDECTOMY    . APPENDECTOMY    . CHOLECYSTECTOMY    . INDUCED ABORTION    . TONSILLECTOMY        Family History  Problem Relation Age of Onset  . Diabetes Brother   . Hypertension Brother   . Stomach cancer Neg Hx   . Rectal cancer Neg Hx   . Colon cancer Neg Hx   . Colon polyps Neg Hx       Social History   Socioeconomic History  . Marital status: Single    Spouse name: Not on file  . Number of children: Not on file  . Years of education: Not on file  . Highest education level: Not on file  Social Needs  . Financial resource strain: Not hard at all  . Food insecurity - worry: Never true  . Food insecurity - inability: Never true  . Transportation needs - medical: No  . Transportation needs - non-medical: No  Occupational History  . Occupation: PCA  Tobacco Use  . Smoking status: Light Tobacco Smoker    Packs/day: 0.10    Years: 30.00    Pack years: 3.00    Types: Cigarettes  . Smokeless tobacco: Never Used  . Tobacco comment: "cutting back," using patches, non-nicotine vap  Substance and Sexual Activity  . Alcohol use: Yes    Alcohol/week: 0.0 oz    Comment: socially  . Drug use: No  . Sexual activity: Yes    Partners: Male    Birth control/protection: Condom    Comment: declined condoms   Other Topics Concern  . Not on file  Social History Narrative  . Not on file      Review of Systems  Constitutional: Negative for appetite change, chills, diaphoresis, fatigue, fever and unexpected weight change.  HENT: Positive for congestion.   Eyes:       Negative for visual change  Respiratory: Positive for cough. Negative for chest tightness, shortness of breath and wheezing.   Cardiovascular: Negative for chest pain.  Gastrointestinal: Negative for diarrhea, nausea and vomiting.  Genitourinary: Negative for dysuria, genital sores, pelvic pain and vaginal discharge.  Musculoskeletal: Negative for neck pain and neck stiffness.  Skin: Negative for rash.  Neurological: Negative for seizures, syncope, weakness and headaches.  Hematological: Negative for adenopathy. Does not bruise/bleed easily.  Psychiatric/Behavioral: Negative for hallucinations.       Objective:  BP (!) 143/86 (BP Location: Right Arm, Patient Position: Sitting, Cuff Size: Normal)   Pulse 84   Temp 98.4 F (36.9 C) (Oral)   Ht '5\' 4"'$  (1.626 m)   Wt 172 lb (78 kg)   BMI 29.52 kg/m  Nursing note and vital signs reviewed.  Physical Exam  Constitutional: She is oriented to person, place, and time. She appears well-developed and well-nourished. No distress.  HENT:  Right Ear: Hearing, tympanic membrane, external ear and ear canal normal.  Left Ear: Hearing, tympanic membrane, external ear and ear canal normal.  Nose: Nose normal. Right sinus exhibits no maxillary sinus tenderness and no frontal sinus tenderness. Left sinus exhibits no maxillary sinus tenderness and no frontal sinus tenderness.  Neck: Neck supple.  Cardiovascular: Normal rate, regular rhythm, normal heart sounds and intact distal pulses. Exam reveals no gallop and no friction rub.  No murmur heard. Pulmonary/Chest: Effort normal and breath sounds normal. No respiratory distress. She has no wheezes. She has no rales. She exhibits no  tenderness.  Lymphadenopathy:    She has no cervical adenopathy.  Neurological: She is alert and oriented to person, place, and time.  Skin: Skin is warm and dry.  Psychiatric: She has a normal mood and affect. Her behavior is normal. Judgment and thought content normal.        Assessment & Plan:   Problem List Items Addressed This Visit      Respiratory   Viral upper respiratory tract infection    New-onset cough and congestion consistent with viral upper respiratory infection. Treat conservatively with over-the-counter medications as needed for symptom relief and supportive care. Follow-up if symptoms worsen or do not improve.      Relevant Medications   bictegravir-emtricitabine-tenofovir AF (BIKTARVY) 50-200-25 MG TABS tablet     Other   Human immunodeficiency virus (HIV) disease ([place]) - Primary    Most recent CD4 count of 690 and viral load undetectable. She's been out of medication and question whether its lack of insurance coverage or medication refills. After discussion we'll change her to [facility] for which she is in agreement.  She has had her flu and pneumococcal vaccinations. Her hepatitis B series is up to date. Plan to follow up in 1 month to repeat CD4 count and viral load.      Relevant Medications   bictegravir-emtricitabine-tenofovir AF (BIKTARVY) 50-200-25 MG TABS tablet       I have discontinued [name]'s Calcium Carbonate Antacid (ALKA-SELTZER ANTACID PO), traMADol, temazepam, atorvastatin, Na Sulfate-K Sulfate-Mg Sulf, and pantoprazole. I am also having her start on bictegravir-emtricitabine-tenofovir AF. Additionally, I am having her maintain her loratadine, gabapentin, nicotine, and elvitegravir-cobicistat-emtricitabine-tenofovir.   Meds ordered this encounter  Medications  . bictegravir-emtricitabine-tenofovir AF (BIKTARVY) 50-200-25 MG TABS tablet    Sig: Take 1 tablet by mouth daily.    Dispense:  30 tablet    Refill:  1    Order  Specific Question:   Supervising Provider    Answer:   Carlyle Basques [[id number]]     Follow-up: Return in about 1 month (around 08/26/2017), or if symptoms worsen or fail to improve.  Mauricio Po, [place] for Infectious Disease

## 2017-07-29 ENCOUNTER — Ambulatory Visit (INDEPENDENT_AMBULATORY_CARE_PROVIDER_SITE_OTHER): Payer: Self-pay | Admitting: Family

## 2017-07-29 ENCOUNTER — Encounter: Payer: Self-pay | Admitting: Family

## 2017-07-29 VITALS — BP 143/86 | HR 84 | Temp 98.4°F | Ht 64.0 in | Wt 172.0 lb

## 2017-07-29 DIAGNOSIS — B20 Human immunodeficiency virus [HIV] disease: Secondary | ICD-10-CM

## 2017-07-29 DIAGNOSIS — J069 Acute upper respiratory infection, unspecified: Secondary | ICD-10-CM | POA: Insufficient documentation

## 2017-07-29 MED ORDER — BICTEGRAVIR-EMTRICITAB-TENOFOV 50-200-25 MG PO TABS: 1.0000 | ORAL_TABLET | Freq: Every day | ORAL | 1 refills | Status: DC

## 2017-07-29 NOTE — Assessment & Plan Note (Signed)
New-onset cough and congestion consistent with viral upper respiratory infection. Treat conservatively with over-the-counter medications as needed for symptom relief and supportive care. Follow-up if symptoms worsen or do not improve.

## 2017-07-29 NOTE — Assessment & Plan Note (Signed)
Most recent CD4 count of 690 and viral load undetectable. She's been out of medication and question whether its lack of insurance coverage or medication refills. After discussion we'll change her to [facility] for which she is in agreement.  She has had her flu and pneumococcal vaccinations. Her hepatitis B series is up to date. Plan to follow up in 1 month to repeat CD4 count and viral load.

## 2017-07-29 NOTE — Patient Instructions (Addendum)
Nice to meet you!  We will have you change your Genvoya to [place].  Do not take calcium or iron at the time you are taking Biktarvy. Recommend one medication at night and one in the morning.   Plan to follow up in 1 month for blood work.   General Recommendations:    Please drink plenty of fluids.  Get plenty of rest   Sleep in humidified air  Use saline nasal sprays  Netti pot   OTC Medications:  Decongestants - helps relieve congestion   Flonase (generic fluticasone) or Nasacort (generic triamcinolone) - please make sure to use the "cross-over" technique at a 45 degree angle towards the opposite eye as opposed to straight up the nasal passageway.   Sudafed (generic pseudoephedrine - Note this is the one that is available behind the pharmacy counter); Products with phenylephrine (-PE) may also be used but is often not as effective as pseudoephedrine.   If you have HIGH BLOOD PRESSURE - Coricidin HBP; AVOID any product that is -D as this contains pseudoephedrine which may increase your blood pressure.  Afrin (oxymetazoline) every 6-8 hours for up to 3 days.   Allergies - helps relieve runny nose, itchy eyes and sneezing   Claritin (generic loratidine), Allegra (fexofenidine), or Zyrtec (generic cyrterizine) for runny nose. These medications should not cause drowsiness.  Note - Benadryl (generic diphenhydramine) may be used however may cause drowsiness  Cough -   Delsym or Robitussin (generic dextromethorphan)  Expectorants - helps loosen mucus to ease removal   Mucinex (generic guaifenesin) as directed on the package.  Headaches / General Aches   Tylenol (generic acetaminophen) - DO NOT EXCEED 3 grams (3,000 mg) in a 24 hour time period  Advil/Motrin (generic ibuprofen)   Sore Throat -   Salt water gargle   Chloraseptic (generic benzocaine) spray or lozenges / Sucrets (generic dyclonine)

## 2017-08-02 ENCOUNTER — Encounter: Payer: Self-pay | Admitting: Family

## 2017-08-15 ENCOUNTER — Ambulatory Visit: Payer: Self-pay | Admitting: Infectious Diseases

## 2017-08-19 ENCOUNTER — Ambulatory Visit (INDEPENDENT_AMBULATORY_CARE_PROVIDER_SITE_OTHER): Payer: Self-pay | Admitting: Infectious Diseases

## 2017-08-19 DIAGNOSIS — Z1239 Encounter for other screening for malignant neoplasm of breast: Secondary | ICD-10-CM

## 2017-08-19 DIAGNOSIS — Z1231 Encounter for screening mammogram for malignant neoplasm of breast: Secondary | ICD-10-CM

## 2017-08-19 DIAGNOSIS — Z124 Encounter for screening for malignant neoplasm of cervix: Secondary | ICD-10-CM

## 2017-08-19 NOTE — Progress Notes (Signed)
     Subjective:    Vanessa Browning is a 52 y.o. female here for an annual pelvic exam and pap smear.   Review of Systems: Current GYN complaints or concerns: none.  Patient denies any abdominal/pelvic pain, problems with bowel movements, urination, vaginal discharge or intercourse.   Gynecologic History: No obstetric history on file.  No LMP recorded. Patient is postmenopausal. Contraception: abstinence Last Pap: 01/2015. Results were: normal Anal Intercourse: no Last Mammogram: due for exam   Objective:  Physical Exam  Constitutional: Well developed, well nourished, no acute distress. She is alert and oriented x3.  Pelvic: External genitalia is normal in appearance. The vagina is normal in appearance. The cervix is bulbous and easily visualized. No CMT, normal expected cervical mucus present. Bimanual exam reveals uterus that is felt to be normal size, shape, and contour. No adnexal masses or tenderness noted. Breasts: symmetrical in contour, shape and texture. No palpable masses/nodules. No nipple discharge.  Psych: She has a normal mood and affect.    Assessment:  Normal pelvic and bimanual exam. Thin prep pap was obtained and sent for cytology with reflex to HPV and GC/C today. Normal clinical breast exam.   Plan:  Health Maintenance =   Return in 1 year for annual pap screening unless indicated sooner.   She has been counseled and instructed how to perform monthly self breast exams.  Screening mammogram order entered and scholarship fund paperwork was provided to her today for scheduling.   Contraception / Family Planning =   None - postmenopausal   HIV =   She will continue her Genvoya and F/U as scheduled with Marya Amsler, NP for ongoing HIV care.   Janene Madeira, MSN, NP-C [facility] for Infectious Disease [facility]  08/19/2017 9:22 AM

## 2017-08-19 NOTE — Patient Instructions (Signed)
Please call the [facility] to set up your mammogram.  [phone] [address] [place], [place] [address]  They can help you with the [place] to help pay for the screening   We will call you with results of todays test

## 2017-08-21 LAB — CYTOLOGY - PAP
Adequacy: ABSENT — AB
Chlamydia: NEGATIVE
Neisseria Gonorrhea: NEGATIVE

## 2017-08-26 ENCOUNTER — Ambulatory Visit (INDEPENDENT_AMBULATORY_CARE_PROVIDER_SITE_OTHER): Payer: Self-pay | Admitting: Family

## 2017-08-26 ENCOUNTER — Encounter: Payer: Self-pay | Admitting: Family

## 2017-08-26 VITALS — BP 132/83 | HR 78 | Temp 97.8°F | Ht 62.0 in | Wt 170.0 lb

## 2017-08-26 DIAGNOSIS — B20 Human immunodeficiency virus [HIV] disease: Secondary | ICD-10-CM

## 2017-08-26 MED ORDER — ELVITEG-COBIC-EMTRICIT-TENOFAF 150-150-200-10 MG PO TABS: 1.0000 | ORAL_TABLET | Freq: Every day | ORAL | 5 refills | Status: DC

## 2017-08-26 NOTE — Progress Notes (Signed)
Subjective:    Patient ID: Vanessa Browning, female    DOB: 12-07-1965, 52 y.o.   MRN: [id number]  Chief Complaint  Patient presents with  . Follow-up  . HIV Positive/AIDS     HPI:  Vanessa Browning is a 52 y.o. female who presents today for follow up of her HIV disease. She was last seen on 2/18 previously on Genvoya and transitioned to [place]. She reports that she was not able to get the [facility] and has continued to take her Genvoya. Denies headaches, neck pain, nausea, vomiting, diarrhea, constipation, or changes in vision. She also completed her Pap smear on 08/19/17 with abnormal results and will be seeking further testing.   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 Hepatitis B 12/27/2005, 04/04/2006, 08/22/2006  . Hepatitis B, adult 04/07/2014, 12/29/2014, 12/29/2014, 03/28/2015, 03/28/2015  . Influenza Split 02/28/2011, 03/05/2012  . Influenza Whole 04/04/2006, 06/10/2008, 04/07/2009, 02/22/2010  . Influenza, Seasonal, Injecte, Preservative Fre 03/28/2015, 05/24/2016, 03/05/2017  . Influenza,inj,Quad PF,6+ Mos 04/29/2013, 04/07/2014, 03/28/2015  . PPD Test 10/01/2012  . Pneumococcal Conjugate-13 02/02/2016  . Pneumococcal Polysaccharide-23 04/04/2006, 02/28/2011  . Tdap 03/05/2017      Allergies  Allergen Reactions  . Dexilant [Dexlansoprazole]     Hands swelling       Outpatient Medications Prior to Visit  Medication Sig Dispense Refill  . elvitegravir-cobicistat-emtricitabine-tenofovir (GENVOYA) 150-150-200-10 MG TABS tablet Take 1 tablet by mouth daily with breakfast. 30 tablet 0  . gabapentin (NEURONTIN) 300 MG capsule Take 1 capsule (300 mg total) by mouth 3 (three) times daily. (Patient not taking: Reported on 07/29/2017) 90 capsule  5  . loratadine (CLARITIN) 10 MG tablet Take 1 tablet (10 mg total) by mouth daily. (Patient not taking: Reported on 07/29/2017) 30 tablet 5  . nicotine (NICODERM CQ) 14 mg/24hr patch Place 1 patch (14 mg total) onto the skin daily. (Patient not taking: Reported on 07/29/2017) 30 patch 5  . bictegravir-emtricitabine-tenofovir AF (BIKTARVY) 50-200-25 MG TABS tablet Take 1 tablet by mouth daily. (Patient not taking: Reported on 08/26/2017) 30 tablet 1   No facility-administered medications prior to visit.      Past Medical History:  Diagnosis Date  . Allergy    seasonal  . GERD (gastroesophageal reflux disease)   . HIV (human immunodeficiency virus infection) ([place])   . Hyperlipidemia   . Kidney stone   . Substance abuse ([place])      Review of Systems  Constitutional: Negative for appetite change, chills, diaphoresis, fatigue, fever and unexpected weight change.  Eyes: Negative for pain, redness and visual disturbance.       Negative for acute change in vision  Respiratory: Negative for chest tightness, shortness of breath and wheezing.   Cardiovascular: Negative for chest pain.  Gastrointestinal: Negative for diarrhea, nausea and vomiting.  Genitourinary: Negative for dysuria, pelvic pain and vaginal discharge.  Musculoskeletal: Negative for neck pain and neck stiffness.  Skin: Negative for rash.  Neurological: Negative for seizures, syncope, weakness and headaches.  Hematological: Negative for adenopathy. Does not bruise/bleed easily.  Psychiatric/Behavioral: Negative for hallucinations.      Objective:    BP 132/83   Pulse 78   Temp 97.8 F (36.6 C) (Oral)   Ht 5\' 2"  (1.575 m)  Wt 170 lb (77.1 kg)   BMI 31.09 kg/m  Nursing note and vital signs reviewed.  Physical Exam  Constitutional: She is oriented to person, place, and time. She appears well-developed and well-nourished. No distress.  Cardiovascular: Normal rate, regular rhythm, normal heart sounds and intact distal  pulses.  Pulmonary/Chest: Effort normal and breath sounds normal.  Neurological: She is alert and oriented to person, place, and time.  Skin: Skin is warm and dry.  Psychiatric: She has a normal mood and affect. Her behavior is normal. Judgment and thought content normal.       Assessment & Plan:   Problem List Items Addressed This Visit      Other   Human immunodeficiency virus (HIV) disease ([place]) - Primary    Stable with current dosage of Genvoya. Unsure as to why she was not able to switch to [place], however she is very well controlled with the current regimen and no signs of OI present on exam. Declines condoms. Continue Genvoya with plan to follow up in 6 months or sooner if needed.       Relevant Medications   elvitegravir-cobicistat-emtricitabine-tenofovir (GENVOYA) 150-150-200-10 MG TABS tablet   Other Relevant Orders   T-helper cell (CD4)- ([facility] only)   HIV 1 RNA quant-no reflex-bld   HIV 1 RNA quant-no reflex-bld   T-helper cell (CD4)- ([facility] only)       I have discontinued Vanessa Browning's bictegravir-emtricitabine-tenofovir AF. I am also having her maintain her loratadine, gabapentin, nicotine, and elvitegravir-cobicistat-emtricitabine-tenofovir.   Meds ordered this encounter  Medications  . elvitegravir-cobicistat-emtricitabine-tenofovir (GENVOYA) 150-150-200-10 MG TABS tablet    Sig: Take 1 tablet by mouth daily with breakfast.    Dispense:  30 tablet    Refill:  5    Fill at next patient refill request    Order Specific Question:   Supervising Provider    Answer:   Carlyle Basques [[id number]]     Follow-up: Return in about 6 months (around 02/26/2018), or if symptoms worsen or fail to improve.   Terri Piedra, MSN, [facility] for Infectious Disease

## 2017-08-26 NOTE — Patient Instructions (Signed)
Good to see you!  Please continue to take your Genvoya.   We will check your blood work today and then again in 6 months.

## 2017-08-26 NOTE — Assessment & Plan Note (Signed)
Stable with current dosage of Genvoya. Unsure as to why she was not able to switch to [place], however she is very well controlled with the current regimen and no signs of OI present on exam. Declines condoms. Continue Genvoya with plan to follow up in 6 months or sooner if needed.

## 2017-08-27 ENCOUNTER — Other Ambulatory Visit: Payer: Self-pay | Admitting: *Deleted

## 2017-08-27 DIAGNOSIS — B20 Human immunodeficiency virus [HIV] disease: Secondary | ICD-10-CM

## 2017-08-27 LAB — T-HELPER CELL (CD4) - (RCID CLINIC ONLY)
CD4 % Helper T Cell: 28 % — ABNORMAL LOW (ref 33–55)
CD4 T Cell Abs: 660 /uL (ref 400–2700)

## 2017-08-27 MED ORDER — ELVITEG-COBIC-EMTRICIT-TENOFAF 150-150-200-10 MG PO TABS: 1.0000 | ORAL_TABLET | Freq: Every day | ORAL | 5 refills | Status: DC

## 2017-08-29 ENCOUNTER — Telehealth: Payer: Self-pay

## 2017-08-29 LAB — HIV-1 RNA QUANT-NO REFLEX-BLD
HIV 1 RNA Quant: <20 copies/mL
HIV-1 RNA Quant, Log: <1.3 Log copies/mL

## 2017-08-29 NOTE — Telephone Encounter (Signed)
-----   Message from [place], [place] sent at 08/29/2017  8:41 AM EDT ----- Please inform patient that her viral load remains undetectable and her CD4 count is 660 both good. She can continue with the plan to follow up in 6 months or sooner if needed.

## 2017-08-29 NOTE — Telephone Encounter (Signed)
Per Terri Piedra, NP I called pt to inform her on her recent labs. Informed pt that her viral load came back undetectable her her CD4 was 660 both which are good. Stated to the pt that Marya Amsler would like to continue with the plan he had discussed with the pt and follow up in 6 months. Pt verbalized understand and had no question during the call. Is scheduled to come back in September.  [place]

## 2017-10-04 ENCOUNTER — Other Ambulatory Visit: Payer: Self-pay | Admitting: Obstetrics and Gynecology

## 2017-10-04 DIAGNOSIS — Z1231 Encounter for screening mammogram for malignant neoplasm of breast: Secondary | ICD-10-CM

## 2017-10-22 ENCOUNTER — Ambulatory Visit
Admission: RE | Admit: 2017-10-22 | Discharge: 2017-10-22 | Disposition: A | Discharge status: Home / Self Care | Payer: Self-pay | Source: Ambulatory Visit | Attending: Obstetrics and Gynecology | Admitting: Obstetrics and Gynecology

## 2017-10-22 ENCOUNTER — Ambulatory Visit (HOSPITAL_COMMUNITY)
Admission: RE | Admit: 2017-10-22 | Discharge: 2017-10-22 | Disposition: A | Discharge status: Home / Self Care | Payer: Self-pay | Source: Ambulatory Visit | Attending: Obstetrics and Gynecology | Admitting: Obstetrics and Gynecology

## 2017-10-22 ENCOUNTER — Encounter (HOSPITAL_COMMUNITY): Payer: Self-pay

## 2017-10-22 VITALS — BP 118/72 | Ht 64.0 in | Wt 165.0 lb

## 2017-10-22 DIAGNOSIS — R87612 Low grade squamous intraepithelial lesion on cytologic smear of cervix (LGSIL): Secondary | ICD-10-CM

## 2017-10-22 DIAGNOSIS — Z1239 Encounter for other screening for malignant neoplasm of breast: Secondary | ICD-10-CM

## 2017-10-22 DIAGNOSIS — Z1231 Encounter for screening mammogram for malignant neoplasm of breast: Secondary | ICD-10-CM

## 2017-10-22 NOTE — Patient Instructions (Addendum)
Explained breast self awareness with Vanessa Browning. Patient did not need a Pap smear today due to last Pap smear was 08/19/2017. Explained the colposcopy the recommended follow-up for her abnormal Pap smear. [facility]erred to [facility] [place] [facility] for a colposcopy to follow-up for her abnormal Pap smear. Appointment scheduled for Wednesday, Nov 06, 2017 at 1555. Referred patient to the [facility] for a screening mammogram. Appointment scheduled for Tuesday, Oct 22, 2017 at 1110. Patient aware of appointment and will be there. Let patient know the [facility] will follow up with her within the next couple weeks with results of mammogram by letter or phone. Discussed smoking cessation with patient. Referred to the [facility] Quitline and gave resources to free smoking cessation classes at [facility]. Patient refused smoking cessation materials. Vanessa Browning verbalized understanding.  Vanessa Browning, [place], RN 11:14 AM

## 2017-10-22 NOTE — Progress Notes (Addendum)
Patient referred to [facility] by the [facility] [place] [facility] for a colposcopy due to having an abnormal Pap smear on 08/19/2017.  Pap Smear: Pap smear not completed today. Last Pap smear was 08/19/2017 at [facility] and LGS[facility]. Referred to the [facility] [place] [facility] for a colposcopy to follow-up for her abnormal Pap smear. Appointment scheduled for Wednesday, Nov 06, 2017 at 1555. Per patient her most recent Pap smear is the only abnormal Pap smear she has had. Last three Pap smear results are in Epic.  Physical exam: Breasts Breasts symmetrical. No skin abnormalities bilateral breasts. No nipple retraction bilateral breasts. No nipple discharge bilateral breasts. No lymphadenopathy. No lumps palpated bilateral breasts. No complaints of pain or tenderness on exam. Referred patient to the [facility] for a screening mammogram. Appointment scheduled for Tuesday, Oct 22, 2017 at 1110.        Pelvic/Bimanual No Pap smear completed today since last Pap smear was 08/19/2017. Pap smear not indicated per BCCCP guidelines.   Smoking History: Patient is a current smoker. Discussed smoking cessation with patient. Referred to the [facility] Quitline and gave resources to free smoking cessation classes at [facility]. Patient refused smoking cessation materials.  Patient Navigation: Patient education provided. Access to services provided for patient through [place] program.   Colorectal Cancer Screening: Per patient has never had a colonoscopy completed. No complaints today. FIT Test given to patient to complete and return to BCCCP.  Breast and Cervical Cancer Risk Assessment: Patient has no family history of breast cancer, known genetic mutations, or radiation treatment to the chest before age 62. Patient has no history of cervical dysplasia or DES exposure in-utero. Patient has a history of HIV.

## 2017-10-22 NOTE — Addendum Note (Signed)
Encounter addended by: Loletta Parish, RN on: 10/22/2017 4:00 PM  Actions taken: Sign clinical note

## 2017-10-23 ENCOUNTER — Other Ambulatory Visit: Payer: Self-pay | Admitting: Obstetrics and Gynecology

## 2017-10-23 DIAGNOSIS — R928 Other abnormal and inconclusive findings on diagnostic imaging of breast: Secondary | ICD-10-CM

## 2017-10-28 ENCOUNTER — Ambulatory Visit
Admission: RE | Admit: 2017-10-28 | Discharge: 2017-10-28 | Disposition: A | Discharge status: Home / Self Care | Payer: No Typology Code available for payment source | Source: Ambulatory Visit | Attending: Obstetrics and Gynecology | Admitting: Obstetrics and Gynecology

## 2017-10-28 DIAGNOSIS — R928 Other abnormal and inconclusive findings on diagnostic imaging of breast: Secondary | ICD-10-CM

## 2017-11-05 ENCOUNTER — Other Ambulatory Visit: Payer: Self-pay

## 2017-11-05 ENCOUNTER — Telehealth: Payer: Self-pay

## 2017-11-05 DIAGNOSIS — B20 Human immunodeficiency virus [HIV] disease: Secondary | ICD-10-CM

## 2017-11-05 MED ORDER — ELVITEG-COBIC-EMTRICIT-TENOFAF 150-150-200-10 MG PO TABS: 1.0000 | ORAL_TABLET | Freq: Every day | ORAL | 5 refills | Status: DC

## 2017-11-05 NOTE — Telephone Encounter (Signed)
Pt called today stating that she needs her medication Vanessa Browning) sent to [place] so her insurance will cover the cost. Sent medication to [facility] on [address] pt is aware that medication will be sent to [facility]. [place]

## 2017-11-06 ENCOUNTER — Encounter: Payer: Self-pay | Admitting: Obstetrics & Gynecology

## 2017-11-06 ENCOUNTER — Telehealth (HOSPITAL_COMMUNITY): Payer: Self-pay

## 2017-11-06 ENCOUNTER — Ambulatory Visit (INDEPENDENT_AMBULATORY_CARE_PROVIDER_SITE_OTHER): Payer: Self-pay | Admitting: Obstetrics & Gynecology

## 2017-11-06 ENCOUNTER — Telehealth: Payer: Self-pay | Admitting: Behavioral Health

## 2017-11-06 VITALS — BP 145/89 | HR 71 | Wt 164.4 lb

## 2017-11-06 DIAGNOSIS — N882 Stricture and stenosis of cervix uteri: Secondary | ICD-10-CM

## 2017-11-06 DIAGNOSIS — Z01812 Encounter for preprocedural laboratory examination: Secondary | ICD-10-CM

## 2017-11-06 DIAGNOSIS — R87612 Low grade squamous intraepithelial lesion on cytologic smear of cervix (LGSIL): Secondary | ICD-10-CM | POA: Insufficient documentation

## 2017-11-06 DIAGNOSIS — Z3202 Encounter for pregnancy test, result negative: Secondary | ICD-10-CM

## 2017-11-06 LAB — POCT URINE PREGNANCY: Preg Test, Ur: NEGATIVE

## 2017-11-06 LAB — POCT PREGNANCY, URINE: Preg Test, Ur: NEGATIVE

## 2017-11-06 NOTE — Progress Notes (Signed)
   Subjective:    Patient ID: Vanessa Browning, female    DOB: 27-Apr-1966, 52 y.o.   MRN: [id number]  HPI 52 yo lady sent here from [facility] for a LGSIL pap. She is HIV +. She is postmenopausal.  Review of Systems     Objective:   Physical Exam Breathing, conversing, and ambulating normally Well nourished, well hydrated Black female, no apparent distress UPT negative, consent signed, time out done Cervix prepped with acetic acid. The cervix was stenotic and atrophic, no lesions were seen.  The transformation zone was not seen at all. She tolerated the procedure well.      Assessment & Plan:  Inadequate colpo, LGSIL, HIV Plan for LEEP at next visit

## 2017-11-06 NOTE — Telephone Encounter (Signed)
Patient called stating she changed insurance companies and the pharmacy is wanting her to pay a large amount of money.  Patient informed she can come to the office and pick up a copay card to help with copays.  Patient verbalized understanding.   Pricilla Riffle RN

## 2017-11-06 NOTE — Telephone Encounter (Signed)
Spoke with patient to let her know that the FIT Test she completed and mailed back was not test by [facility] because there was no name on the specimen. I let the patient know that we would mail her a new test to complete and send back. Patient voiced understanding.

## 2017-11-11 ENCOUNTER — Other Ambulatory Visit: Payer: Self-pay | Admitting: *Deleted

## 2017-11-11 DIAGNOSIS — B20 Human immunodeficiency virus [HIV] disease: Secondary | ICD-10-CM

## 2017-11-11 MED ORDER — ELVITEG-COBIC-EMTRICIT-TENOFAF 150-150-200-10 MG PO TABS: 1.0000 | ORAL_TABLET | Freq: Every day | ORAL | 5 refills | Status: DC

## 2017-11-13 ENCOUNTER — Encounter: Payer: Self-pay | Admitting: *Deleted

## 2017-11-15 ENCOUNTER — Encounter (HOSPITAL_COMMUNITY): Payer: Self-pay | Admitting: *Deleted

## 2017-12-02 ENCOUNTER — Ambulatory Visit: Payer: No Typology Code available for payment source | Admitting: Obstetrics & Gynecology

## 2017-12-17 ENCOUNTER — Ambulatory Visit: Payer: No Typology Code available for payment source

## 2017-12-30 ENCOUNTER — Ambulatory Visit: Payer: No Typology Code available for payment source | Admitting: Obstetrics & Gynecology

## 2017-12-30 ENCOUNTER — Other Ambulatory Visit: Payer: Self-pay | Admitting: Obstetrics & Gynecology

## 2017-12-30 NOTE — Progress Notes (Signed)
She has missed 2 appts for a LEEP for inadequate colposcopy (pap showed LGSIL). I have messaged the front office to send her a certified letter about the importance of this procedure.

## 2018-01-06 ENCOUNTER — Encounter: Payer: Self-pay | Admitting: Obstetrics & Gynecology

## 2018-02-24 ENCOUNTER — Other Ambulatory Visit: Payer: Self-pay

## 2018-02-24 ENCOUNTER — Other Ambulatory Visit (HOSPITAL_COMMUNITY)
Admission: RE | Admit: 2018-02-24 | Discharge: 2018-02-24 | Disposition: A | Discharge status: Home / Self Care | Payer: Medicaid Other | Source: Ambulatory Visit | Attending: Infectious Diseases | Admitting: Infectious Diseases

## 2018-02-24 ENCOUNTER — Encounter: Payer: Self-pay | Admitting: *Deleted

## 2018-02-24 ENCOUNTER — Other Ambulatory Visit: Payer: Self-pay | Admitting: Behavioral Health

## 2018-02-24 ENCOUNTER — Other Ambulatory Visit: Payer: Self-pay | Admitting: *Deleted

## 2018-02-24 DIAGNOSIS — Z113 Encounter for screening for infections with a predominantly sexual mode of transmission: Secondary | ICD-10-CM

## 2018-02-24 DIAGNOSIS — B20 Human immunodeficiency virus [HIV] disease: Secondary | ICD-10-CM

## 2018-02-24 DIAGNOSIS — Z79899 Other long term (current) drug therapy: Secondary | ICD-10-CM

## 2018-02-24 MED ORDER — ELVITEG-COBIC-EMTRICIT-TENOFAF 150-150-200-10 MG PO TABS: 1.0000 | ORAL_TABLET | Freq: Every day | ORAL | 5 refills | Status: DC

## 2018-02-25 LAB — T-HELPER CELL (CD4) - (RCID CLINIC ONLY)
CD4 % Helper T Cell: 23 % — ABNORMAL LOW (ref 33–55)
CD4 T Cell Abs: 600 /uL (ref 400–2700)

## 2018-02-25 LAB — URINE CYTOLOGY ANCILLARY ONLY
Chlamydia: NEGATIVE
Neisseria Gonorrhea: NEGATIVE

## 2018-02-26 LAB — CBC WITH DIFFERENTIAL/PLATELET
Basophils Absolute: 43 cells/uL (ref 0–200)
Basophils Relative: 0.5 %
Eosinophils Absolute: 94 cells/uL (ref 15–500)
Eosinophils Relative: 1.1 %
HCT: 36.8 % (ref 35.0–45.0)
Hemoglobin: 12.2 g/dL (ref 11.7–15.5)
Lymphs Abs: 2457 cells/uL (ref 850–3900)
MCH: 30.3 pg (ref 27.0–33.0)
MCHC: 33.2 g/dL (ref 32.0–36.0)
MCV: 91.5 fL (ref 80.0–100.0)
MPV: 10.7 fL (ref 7.5–12.5)
Monocytes Relative: 7 %
Neutro Abs: 5313 cells/uL (ref 1500–7800)
Neutrophils Relative %: 62.5 %
Platelets: 315 10*3/uL (ref 140–400)
RBC: 4.02 10*6/uL (ref 3.80–5.10)
RDW: 12.8 % (ref 11.0–15.0)
Total Lymphocyte: 28.9 %
WBC mixed population: 595 cells/uL (ref 200–950)
WBC: 8.5 10*3/uL (ref 3.8–10.8)

## 2018-02-26 LAB — LIPID PANEL
Cholesterol: 252 mg/dL — ABNORMAL HIGH (ref <200)
HDL: 60 mg/dL (ref >50)
LDL Cholesterol (Calc): 156 mg/dL (calc) — ABNORMAL HIGH
Non-HDL Cholesterol (Calc): 192 mg/dL (calc) — ABNORMAL HIGH (ref <130)
Total CHOL/HDL Ratio: 4.2 (calc) (ref <5.0)
Triglycerides: 196 mg/dL — ABNORMAL HIGH (ref <150)

## 2018-02-26 LAB — COMPREHENSIVE METABOLIC PANEL
AG Ratio: 1.2 (calc) (ref 1.0–2.5)
ALT: 15 U/L (ref 6–29)
AST: 17 U/L (ref 10–35)
Albumin: 4.4 g/dL (ref 3.6–5.1)
Alkaline phosphatase (APISO): 100 U/L (ref 33–130)
BUN: 16 mg/dL (ref 7–25)
CO2: 27 mmol/L (ref 20–32)
Calcium: 9.9 mg/dL (ref 8.6–10.4)
Chloride: 102 mmol/L (ref 98–110)
Creat: 0.93 mg/dL (ref 0.50–1.05)
Globulin: 3.6 g/dL (calc) (ref 1.9–3.7)
Glucose, Bld: 91 mg/dL (ref 65–99)
Potassium: 4.3 mmol/L (ref 3.5–5.3)
Sodium: 139 mmol/L (ref 135–146)
Total Bilirubin: 0.4 mg/dL (ref 0.2–1.2)
Total Protein: 8 g/dL (ref 6.1–8.1)

## 2018-02-26 LAB — HIV-1 RNA QUANT-NO REFLEX-BLD
HIV 1 RNA Quant: <20 copies/mL
HIV-1 RNA Quant, Log: <1.3 Log copies/mL

## 2018-02-26 LAB — RPR: RPR Ser Ql: NONREACTIVE

## 2018-03-10 ENCOUNTER — Ambulatory Visit (INDEPENDENT_AMBULATORY_CARE_PROVIDER_SITE_OTHER): Payer: Self-pay | Admitting: Family

## 2018-03-10 ENCOUNTER — Encounter: Payer: Self-pay | Admitting: Family

## 2018-03-10 VITALS — BP 111/73 | HR 73 | Temp 98.2°F | Ht 64.0 in | Wt 158.0 lb

## 2018-03-10 DIAGNOSIS — Z23 Encounter for immunization: Secondary | ICD-10-CM

## 2018-03-10 DIAGNOSIS — B20 Human immunodeficiency virus [HIV] disease: Secondary | ICD-10-CM

## 2018-03-10 NOTE — Progress Notes (Signed)
Subjective:    Patient ID: Vanessa Browning, female    DOB: 1965/08/08, 52 y.o.   MRN: [id number]  Chief Complaint  Patient presents with  . HIV Positive/AIDS     HPI:  Vanessa Browning is a 52 y.o. female who presents today for routine follow up of HIV disease.   Vanessa Browning was last seen in the office on 08/26/2017 with well-controlled HIV disease and maintained on Genvoya.  She does continue to have a genotype with K103K/N.  Most recent blood work completed on 02/24/2018 with a viral load that was undetectable and CD4 count of 600.  Kidney function, liver function, electrolytes were within normal ranges.  Cholesterol was elevated with an LDL level of 156 and an HDL level of 60.  Her total cholesterol was noted to be 252.  Vanessa Browning reports taking her Genvoya as prescribed with no adverse reactions or missed doses.  She has no problems obtaining her medication.  She is due for renewal of her financial assistance. Denies fevers, chills, night sweats, headaches, changes in vision, neck pain/stiffness, nausea, diarrhea, vomiting, lesions or rashes. She has question if her chronic hair loss could be related to medication and if there is anything that could stimulate her appetite. She is under significant stress balancing life and family stressors.    Having som Allergies  Allergen Reactions  . Dexilant [Dexlansoprazole]     Hands swelling       Outpatient Medications Prior to Visit  Medication Sig Dispense Refill  . elvitegravir-cobicistat-emtricitabine-tenofovir (GENVOYA) 150-150-200-10 MG TABS tablet Take 1 tablet by mouth daily with breakfast. 30 tablet 5  . gabapentin (NEURONTIN) 300 MG capsule Take 1 capsule (300 mg total) by mouth 3 (three) times daily. (Patient not taking: Reported on 07/29/2017) 90 capsule 5  . loratadine (CLARITIN) 10 MG tablet Take 1 tablet (10 mg total) by mouth daily. (Patient not taking: Reported on 07/29/2017) 30 tablet 5   No  facility-administered medications prior to visit.      Past Medical History:  Diagnosis Date  . Allergy    seasonal  . GERD (gastroesophageal reflux disease)   . HIV (human immunodeficiency virus infection) ([place])   . Hyperlipidemia   . Kidney stone   . Substance abuse [facility])      Past Surgical History:  Procedure Laterality Date  . APPENDECTOMY    . APPENDECTOMY    . CHOLECYSTECTOMY    . INDUCED ABORTION    . TONSILLECTOMY         Review of Systems  Constitutional: Negative for appetite change, chills, diaphoresis, fatigue, fever and unexpected weight change.  Eyes:       Negative for acute change in vision  Respiratory: Negative for chest tightness, shortness of breath and wheezing.   Cardiovascular: Negative for chest pain.  Gastrointestinal: Negative for diarrhea, nausea and vomiting.  Genitourinary: Negative for dysuria, pelvic pain and vaginal discharge.  Musculoskeletal: Negative for neck pain and neck stiffness.  Skin: Negative for rash.  Neurological: Negative for seizures, syncope, weakness and headaches.  Hematological: Negative for adenopathy. Does not bruise/bleed easily.  Psychiatric/Behavioral: Negative for hallucinations.      Objective:    BP 111/73   Pulse 73   Temp 98.2 F (36.8 C) (Oral)   Ht 5\' 4"  (1.626 m)   Wt 158 lb (71.7 kg)   BMI 27.12 kg/m  Nursing note and vital signs reviewed.  Physical Exam  Constitutional: She is oriented to person, place,  and time. She appears well-developed. No distress.  HENT:  Mouth/Throat: Oropharynx is clear and moist.  Eyes: Conjunctivae are normal.  Neck: Neck supple.  Cardiovascular: Normal rate, regular rhythm, normal heart sounds and intact distal pulses. Exam reveals no gallop and no friction rub.  No murmur heard. Pulmonary/Chest: Effort normal and breath sounds normal. No respiratory distress. She has no wheezes. She has no rales. She exhibits no tenderness.  Abdominal: Soft. Bowel sounds are  normal. There is no tenderness.  Lymphadenopathy:    She has no cervical adenopathy.  Neurological: She is alert and oriented to person, place, and time.  Skin: Skin is warm and dry. No rash noted.  Psychiatric: She has a normal mood and affect. Her behavior is normal. Judgment and thought content normal.       Assessment & Plan:   Problem List Items Addressed This Visit      Other   Human immunodeficiency virus (HIV) disease ([place]) - Primary    HIV disease is well controlled with Genvoya and undetectable. CD4 count is 600. She is adherent to her medication regimen with no adverse side effects. She has no signs/symptoms of opportunistic infections through history or physical exam. Vanessa Browning is causing her hair loss which is now improved. This is likely related to stress. We discussed seeing Vanessa Browning, however she declned at this time. Menveo and influenza updated today. Continue current dose of Genvoya. Continue to monitor cholesterol levels and if remain elevated will need to start on medication as this places her at risk for coronary artery disease. She renewed her UMAP today. In the event she is not approved, she has been instructed to contact us so we may bridge her for a month to her next refill. Plan for follow up office visit in 6 months or sooner if needed with blood work 1-2 weeks prior to appointment.       Relevant Orders   T-helper cell (CD4)- ([facility] clinic only)   HIV-1 RNA quant-no reflex-bld   CBC   Lipid panel   Comprehensive metabolic panel   RPR   MENINGOCOCCAL MCV4O(MENVEO) (Completed)    Other Visit Diagnoses    Need for meningococcal vaccination       Relevant Orders   MENINGOCOCCAL MCV4O(MENVEO) (Completed)   Need for immunization against influenza       Relevant Orders   Flu Vaccine QUAD 36+ mos IM (Completed)       I am having Vanessa Browning maintain her loratadine, gabapentin, and elvitegravir-cobicistat-emtricitabine-tenofovir.   No orders of  the defined types were placed in this encounter.    Follow-up: Return in about 6 months (around 09/08/2018), or if symptoms worsen or fail to improve.   Terri Piedra, MSN, FNP-C Nurse Practitioner [facility] for Infectious Disease [place] [facility] Office phone: [phone] Pager: [place] number: [phone]

## 2018-03-10 NOTE — Assessment & Plan Note (Addendum)
HIV disease is well controlled with Genvoya and undetectable. CD4 count is 600. She is adherent to her medication regimen with no adverse side effects. She has no signs/symptoms of opportunistic infections through history or physical exam. [name] is causing her hair loss which is now improved. This is likely related to stress. We discussed seeing [name], however she declned at this time. Menveo and influenza updated today. Continue current dose of Genvoya. Continue to monitor cholesterol levels and if remain elevated will need to start on medication as this places her at risk for coronary artery disease. She renewed her UMAP today. In the event she is not approved, she has been instructed to contact us so we may bridge her for a month to her next refill. Plan for follow up office visit in 6 months or sooner if needed with blood work 1-2 weeks prior to appointment.

## 2018-03-10 NOTE — Patient Instructions (Signed)
Nice to see you.  Continue to take your Genvoya as prescribed.   Work on reducing your stress levels.   [name], our counselor is here as you need her.  Renew financial paperwork with Juliann Pulse today.  Follow up in 6 months or sooner if needed with blood work 1-2 weeks prior to appointment.

## 2018-03-24 ENCOUNTER — Ambulatory Visit: Payer: No Typology Code available for payment source | Admitting: Infectious Diseases

## 2018-03-26 ENCOUNTER — Ambulatory Visit: Payer: No Typology Code available for payment source | Admitting: Infectious Diseases

## 2018-03-26 NOTE — Progress Notes (Deleted)
      Subjective:    Vanessa Browning is a 52 y.o. female here for an annual pelvic exam and pap smear.   Review of Systems: Current GYN complaints or concerns: ***.  Patient denies any abdominal/pelvic pain, problems with bowel movements, urination, vaginal discharge or intercourse.   Past Medical History:  Diagnosis Date  . Allergy    seasonal  . GERD (gastroesophageal reflux disease)   . HIV (human immunodeficiency virus infection) ([place])   . Hyperlipidemia   . Kidney stone   . Substance abuse [facility])     Gynecologic History: G6P0010  No LMP recorded. Patient is postmenopausal. Contraception: post menopausal status Last Pap: 08/2017. Results were: abnormal (LSIL, no HPV done). Anal Intercourse: {yes/no} Last Mammogram: 10/22/17. Results were: abnormal - variance that was not well understood - patient had ultrasound to follow up and findings revealed normal tissue. Due in 10/2018.   Objective:  Physical Exam  Constitutional: Well developed, well nourished, no acute distress. She is alert and oriented x3.  Pelvic: External genitalia is normal in appearance. The vagina is normal in appearance. The cervix is bulbous and easily visualized. No CMT, normal expected cervical mucus present. Bimanual exam reveals uterus that is felt to be normal size, shape, and contour. No adnexal masses or tenderness noted. Breasts: symmetrical in contour, shape and texture. No palpable masses/nodules. No nipple discharge.  Psych: She has a normal mood and affect.    Assessment:  Normal pelvic and bimanual exam. Thin prep pap was obtained and sent for cytology with reflex HPV and GC/C today. Normal clinical breast exam.   Plan:  Health Maintenance =   Return in 1 year for annual pap screening unless indicated sooner. Discussed recommended screening interval for women living with HIV disease. Will continue annual screenings for now with consideration to increase interval to q50yr pending further  discussions  She has been counseled and instructed how to perform monthly self breast exams.  Screening mammogram to be scheduled.   Contraception / Family Planning =   Post-menopausal  HIV =   She will continue her *** and F/U as scheduled with *** for ongoing HIV care.   Janene Madeira, MSN, NP-C [facility] for Infectious [place] [facility] Office: [phone] Pager: [phone]  03/26/18 2:05 PM

## 2018-07-23 ENCOUNTER — Ambulatory Visit: Payer: Medicaid Other

## 2018-07-23 ENCOUNTER — Ambulatory Visit: Payer: Self-pay

## 2018-07-23 ENCOUNTER — Other Ambulatory Visit: Payer: Self-pay | Admitting: Occupational Medicine

## 2018-07-23 DIAGNOSIS — M545 Low back pain, unspecified: Secondary | ICD-10-CM

## 2018-07-23 DIAGNOSIS — M542 Cervicalgia: Secondary | ICD-10-CM

## 2018-08-25 ENCOUNTER — Other Ambulatory Visit: Payer: Self-pay

## 2018-08-25 ENCOUNTER — Other Ambulatory Visit: Payer: Medicaid Other

## 2018-08-25 DIAGNOSIS — B20 Human immunodeficiency virus [HIV] disease: Secondary | ICD-10-CM

## 2018-08-26 LAB — T-HELPER CELL (CD4) - (RCID CLINIC ONLY)
CD4 % Helper T Cell: 25 % — ABNORMAL LOW (ref 33–55)
CD4 T Cell Abs: 610 /uL (ref 400–2700)

## 2018-08-27 LAB — LIPID PANEL
Cholesterol: 229 mg/dL — ABNORMAL HIGH (ref <200)
HDL: 56 mg/dL (ref >=50)
LDL Cholesterol (Calc): 138 mg/dL (calc) — ABNORMAL HIGH
Non-HDL Cholesterol (Calc): 173 mg/dL (calc) — ABNORMAL HIGH (ref <130)
Total CHOL/HDL Ratio: 4.1 (calc) (ref <5.0)
Triglycerides: 212 mg/dL — ABNORMAL HIGH (ref <150)

## 2018-08-27 LAB — COMPREHENSIVE METABOLIC PANEL
AG Ratio: 1.4 (calc) (ref 1.0–2.5)
ALT: 20 U/L (ref 6–29)
AST: 17 U/L (ref 10–35)
Albumin: 4.2 g/dL (ref 3.6–5.1)
Alkaline phosphatase (APISO): 90 U/L (ref 37–153)
BUN: 15 mg/dL (ref 7–25)
CO2: 29 mmol/L (ref 20–32)
Calcium: 9.6 mg/dL (ref 8.6–10.4)
Chloride: 103 mmol/L (ref 98–110)
Creat: 0.94 mg/dL (ref 0.50–1.05)
Globulin: 3.1 g/dL (calc) (ref 1.9–3.7)
Glucose, Bld: 101 mg/dL — ABNORMAL HIGH (ref 65–99)
Potassium: 3.7 mmol/L (ref 3.5–5.3)
Sodium: 139 mmol/L (ref 135–146)
Total Bilirubin: 0.4 mg/dL (ref 0.2–1.2)
Total Protein: 7.3 g/dL (ref 6.1–8.1)

## 2018-08-27 LAB — CBC
HCT: 35.7 % (ref 35.0–45.0)
Hemoglobin: 12 g/dL (ref 11.7–15.5)
MCH: 31.5 pg (ref 27.0–33.0)
MCHC: 33.6 g/dL (ref 32.0–36.0)
MCV: 93.7 fL (ref 80.0–100.0)
MPV: 10.9 fL (ref 7.5–12.5)
Platelets: 282 10*3/uL (ref 140–400)
RBC: 3.81 10*6/uL (ref 3.80–5.10)
RDW: 12.2 % (ref 11.0–15.0)
WBC: 7.4 10*3/uL (ref 3.8–10.8)

## 2018-08-27 LAB — RPR: RPR Ser Ql: NONREACTIVE

## 2018-08-27 LAB — HIV-1 RNA QUANT-NO REFLEX-BLD
HIV 1 RNA Quant: 24 copies/mL — ABNORMAL HIGH
HIV-1 RNA Quant, Log: 1.38 Log copies/mL — ABNORMAL HIGH

## 2018-09-04 ENCOUNTER — Other Ambulatory Visit: Payer: Self-pay

## 2018-09-04 ENCOUNTER — Ambulatory Visit (INDEPENDENT_AMBULATORY_CARE_PROVIDER_SITE_OTHER): Payer: Medicaid Other | Admitting: Family

## 2018-09-04 ENCOUNTER — Encounter: Payer: Self-pay | Admitting: Family

## 2018-09-04 VITALS — BP 118/71 | HR 99 | Temp 98.2°F | Wt 170.0 lb

## 2018-09-04 DIAGNOSIS — B20 Human immunodeficiency virus [HIV] disease: Secondary | ICD-10-CM

## 2018-09-04 DIAGNOSIS — Z23 Encounter for immunization: Secondary | ICD-10-CM

## 2018-09-04 DIAGNOSIS — Z720 Tobacco use: Secondary | ICD-10-CM | POA: Diagnosis not present

## 2018-09-04 DIAGNOSIS — Z01419 Encounter for gynecological examination (general) (routine) without abnormal findings: Secondary | ICD-10-CM | POA: Diagnosis not present

## 2018-09-04 DIAGNOSIS — Z0000 Encounter for general adult medical examination without abnormal findings: Secondary | ICD-10-CM | POA: Insufficient documentation

## 2018-09-04 MED ORDER — ELVITEG-COBIC-EMTRICIT-TENOFAF 150-150-200-10 MG PO TABS: 1.0000 | ORAL_TABLET | Freq: Every day | ORAL | 5 refills | Status: DC

## 2018-09-04 MED ORDER — NICOTINE 21 MG/24HR TD PT24: 21.0000 mg | MEDICATED_PATCH | Freq: Every day | TRANSDERMAL | 1 refills | Status: DC

## 2018-09-04 NOTE — Assessment & Plan Note (Signed)
   Second dose of Menveo updated today.  Due for well woman exam with referral to gynecology placed based on previously abnormal Pap smear results. Discussed importance of safe sexual practice to reduce risk of acquisition and transmission of STI.  Placed on dental list for routine dental care.

## 2018-09-04 NOTE — Progress Notes (Signed)
Subjective:    Patient ID: Vanessa Browning, female    DOB: 1965/07/08, 53 y.o.   MRN: [id number]  Chief Complaint  Patient presents with  . HIV Positive/AIDS     HPI:  Vanessa Browning is a 53 y.o. female who presents today for routine follow up of HIV disease.  [name] was last seen in the office on 03/10/18 with good adherence and tolerance to her ART regimen of Genvoya. Blood work at the time showed a viral load that was undetectable and CD4 count of 600. Most recent blood work completed on 08/25/18 with viral load of 24 and undetectable with Cd4 count of 610. RPR was non-reactive for syphilis. Kidney function, liver function and electrolytes within normal ranges. Her cholesterol was elevated with an LDL of 138, HDL of 56 and triglycerides of 212. She is due for her second dose of Menveo, Pap smear and mammogram.   [name] continues to take her Genvoya as prescribed with no adverse side effects or missed doses.  Overall she feels well today.  She does express concern regarding coronavirus. Denies fevers, chills, night sweats, headaches, changes in vision, neck pain/stiffness, nausea, diarrhea, vomiting, lesions or rashes.  [name] remains covered through [facility] and has no problems obtaining her medication.  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  No recreational or illicit drug use.  Denies feelings of being down, depressed, or hopeless over the last 2 weeks.  She is due for cervical cancer and breast cancer screenings.  She is also wanting to quit smoking and use the nicotine patches.  She currently smokes approximately 1 pack/day on average.  Allergies  Allergen Reactions  . Dexilant [Dexlansoprazole]     Hands swelling       Outpatient Medications Prior to Visit  Medication Sig Dispense Refill  . elvitegravir-cobicistat-emtricitabine-tenofovir (GENVOYA) 150-150-200-10 MG TABS tablet Take 1 tablet by mouth daily with breakfast.  30 tablet 5  . cyclobenzaprine (FLEXERIL) 10 MG tablet TK 1 T PO HS PRF MUSCLE SPASMS AND PAIN    . gabapentin (NEURONTIN) 300 MG capsule Take 1 capsule (300 mg total) by mouth 3 (three) times daily. (Patient not taking: Reported on 07/29/2017) 90 capsule 5  . loratadine (CLARITIN) 10 MG tablet Take 1 tablet (10 mg total) by mouth daily. (Patient not taking: Reported on 07/29/2017) 30 tablet 5   No facility-administered medications prior to visit.      Past Medical History:  Diagnosis Date  . Allergy    seasonal  . GERD (gastroesophageal reflux disease)   . HIV (human immunodeficiency virus infection) ([place])   . Hyperlipidemia   . Kidney stone   . Substance abuse [facility])      Past Surgical History:  Procedure Laterality Date  . APPENDECTOMY    . APPENDECTOMY    . CHOLECYSTECTOMY    . INDUCED ABORTION    . TONSILLECTOMY         Review of Systems  Constitutional: Negative for appetite change, chills, diaphoresis, fatigue, fever and unexpected weight change.  Eyes:       Negative for acute change in vision  Respiratory: Negative for chest tightness, shortness of breath and wheezing.   Cardiovascular: Negative for chest pain.  Gastrointestinal: Negative for diarrhea, nausea and vomiting.  Genitourinary: Negative for dysuria, pelvic pain and vaginal discharge.  Musculoskeletal: Negative for neck pain and neck stiffness.  Skin: Negative for rash.  Neurological: Negative for seizures, syncope, weakness  and headaches.  Hematological: Negative for adenopathy. Does not bruise/bleed easily.  Psychiatric/Behavioral: Negative for hallucinations.      Objective:    BP 118/71   Pulse 99   Temp 98.2 F (36.8 C) (Oral)   Wt 170 lb (77.1 kg)   BMI 29.18 kg/m  Nursing note and vital signs reviewed.  Physical Exam Constitutional:      General: She is not in acute distress.    Appearance: She is well-developed.     Comments: Seated in the chair; pleasant.  Eyes:      Conjunctiva/sclera: Conjunctivae normal.  Neck:     Musculoskeletal: Neck supple.  Cardiovascular:     Rate and Rhythm: Normal rate and regular rhythm.     Heart sounds: Normal heart sounds. No murmur. No friction rub. No gallop.   Pulmonary:     Effort: Pulmonary effort is normal. No respiratory distress.     Breath sounds: Normal breath sounds. No wheezing or rales.  Chest:     Chest wall: No tenderness.  Abdominal:     General: Bowel sounds are normal.     Palpations: Abdomen is soft.     Tenderness: There is no abdominal tenderness.  Lymphadenopathy:     Cervical: No cervical adenopathy.  Skin:    General: Skin is warm and dry.     Findings: No rash.  Neurological:     Mental Status: She is alert.  Psychiatric:        Mood and Affect: Mood normal.        Assessment & Plan:   Problem List Items Addressed This Visit      Other   Tobacco abuse - Primary (Chronic)    [name] expresses desire to quit smoking and noticed recently that her increased stress has caused her to continue to smoke.  Discussed options including patches, gums, inhalers, and medications.  She wishes to use nicotine patches.  Will start 21 mg patches x6 weeks and titrate down to 14 mg patches for 2 weeks and finally 7 mg patches for 2 weeks.  Information on tobacco cessation provided in after visit summary.  We discussed the importance of finding stress relief and alternative relaxation techniques.  She is in the contemplation stage of quitting.  We will continue to monitor      Relevant Medications   nicotine (NICODERM CQ) 21 mg/24hr patch   Other Relevant Orders   MENINGOCOCCAL MCV4O (Completed)   Human immunodeficiency virus (HIV) disease ([place])    Vanessa Browning has well-controlled HIV disease with her current regimen of Genvoya with good adherence and tolerance.  No signs/symptoms of opportunistic infection or progressive HIV disease at present.  She has no problems obtaining her medications.  Continue  current dose of Genvoya.  Plan for follow-up in 6 months or sooner if needed with blood work 1 to 2 weeks prior to appointment.      Relevant Medications   elvitegravir-cobicistat-emtricitabine-tenofovir (GENVOYA) 150-150-200-10 MG TABS tablet   Other Relevant Orders   T-helper cell (CD4)- ([facility] clinic only)   HIV-1 RNA quant-no reflex-bld   CBC   Comprehensive metabolic panel   Healthcare maintenance     Second dose of Menveo updated today.  Due for well woman exam with referral to gynecology placed based on previously abnormal Pap smear results. Discussed importance of safe sexual practice to reduce risk of acquisition and transmission of STI.  Placed on dental list for routine dental care.  Other Visit Diagnoses    Well woman exam       Relevant Orders   Ambulatory referral to Gynecology   Need for meningococcus vaccine       Relevant Orders   MENINGOCOCCAL MCV4O (Completed)       I have discontinued Vanessa Browning's loratadine and gabapentin. I am also having her start on nicotine. Additionally, I am having her maintain her cyclobenzaprine and elvitegravir-cobicistat-emtricitabine-tenofovir.   Meds ordered this encounter  Medications  . nicotine (NICODERM CQ) 21 mg/24hr patch    Sig: Place 1 patch (21 mg total) onto the skin daily.    Dispense:  21 patch    Refill:  1    Order Specific Question:   Supervising Provider    Answer:   Carlyle Basques [[id number]]  . elvitegravir-cobicistat-emtricitabine-tenofovir (GENVOYA) 150-150-200-10 MG TABS tablet    Sig: Take 1 tablet by mouth daily with breakfast.    Dispense:  30 tablet    Refill:  5    Order Specific Question:   Supervising Provider    Answer:   Carlyle Basques [[id number]]     Follow-up: Return in about 6 months (around 03/07/2019), or if symptoms worsen or fail to improve.   Terri Piedra, MSN, FNP-C Nurse Practitioner [facility] for Infectious Disease [place] number:  [phone]

## 2018-09-04 NOTE — Assessment & Plan Note (Signed)
[name] expresses desire to quit smoking and noticed recently that her increased stress has caused her to continue to smoke.  Discussed options including patches, gums, inhalers, and medications.  She wishes to use nicotine patches.  Will start 21 mg patches x6 weeks and titrate down to 14 mg patches for 2 weeks and finally 7 mg patches for 2 weeks.  Information on tobacco cessation provided in after visit summary.  We discussed the importance of finding stress relief and alternative relaxation techniques.  She is in the contemplation stage of quitting.  We will continue to monitor

## 2018-09-04 NOTE — Assessment & Plan Note (Signed)
[name] has well-controlled HIV disease with her current regimen of Genvoya with good adherence and tolerance.  No signs/symptoms of opportunistic infection or progressive HIV disease at present.  She has no problems obtaining her medications.  Continue current dose of Genvoya.  Plan for follow-up in 6 months or sooner if needed with blood work 1 to 2 weeks prior to appointment.

## 2018-09-04 NOTE — Patient Instructions (Addendum)
Nice to see you.  Continue to take your [facility] as prescribed.  A prescription for your nicotiene patches has been sent to the pharmacy. Please let us know in 6 weeks when you are ready to decrease the dosage.  A referral to gynecology has been placed.  We will plan to see you back in 6 months or sooner if needed with lab work 1-2 weeks prior to your appointment.    Smoking Hazards Smoking cigarettes is extremely bad for your health. Tobacco smoke has over 200 known poisons in it. It contains the poisonous gases nitrogen oxide and carbon monoxide. There are over 60 chemicals in tobacco smoke that cause cancer. Some of the chemicals found in cigarette smoke include:   Cyanide.   Benzene.   Formaldehyde.   Methanol (wood alcohol).   Acetylene (fuel used in welding torches).   Ammonia.  Even smoking lightly shortens your life expectancy by several years. You can greatly reduce the risk of medical problems for you and your family by stopping now. Smoking is the most preventable cause of death and disease in our society. Within days of quitting smoking, your circulation improves, you decrease the risk of having a heart attack, and your lung capacity improves. There may be some increased phlegm in the first few days after quitting, and it may take months for your lungs to clear up completely. Quitting for 10 years reduces your risk of developing lung cancer to almost that of a nonsmoker.  WHAT ARE THE RISKS OF SMOKING? Cigarette smokers have an increased risk of many serious medical problems, including:  Lung cancer.   Lung disease (such as pneumonia, bronchitis, and emphysema).   Heart attack and chest pain due to the heart not getting enough oxygen (angina).   Heart disease and peripheral blood vessel disease.   Hypertension.   Stroke.   Oral cancer (cancer of the lip, mouth, or voice box).   Bladder cancer.   Pancreatic cancer.   Cervical cancer.    Pregnancy complications, including premature birth.   Stillbirths and smaller newborn babies, birth defects, and genetic damage to sperm.   Early menopause.   Lower estrogen level for women.   Infertility.   Facial wrinkles.   Blindness.   Increased risk of broken bones (fractures).   Senile dementia.   Stomach ulcers and internal bleeding.   Delayed wound healing and increased risk of complications during surgery. Because of secondhand smoke exposure, children of smokers have an increased risk of the following:   Sudden infant death syndrome (SIDS).   Respiratory infections.   Lung cancer.   Heart disease.   Ear infections.  WHY IS SMOKING ADDICTIVE? Nicotine is the chemical agent in tobacco that is capable of causing addiction or dependence. When you smoke and inhale, nicotine is absorbed rapidly into the bloodstream through your lungs. Both inhaled and noninhaled nicotine may be addictive.  WHAT ARE THE BENEFITS OF QUITTING?  There are many health benefits to quitting smoking. Some are:   The likelihood of developing cancer and heart disease decreases. Health improvements are seen almost immediately.   Blood pressure, pulse rate, and breathing patterns start returning to normal soon after quitting.   People who quit may see an improvement in their overall quality of life.  HOW DO YOU QUIT SMOKING? Smoking is an addiction with both physical and psychological effects, and longtime habits can be hard to change. Your health care provider can recommend:  Programs and community resources, which may include  group support, education, or therapy.  Replacement products, such as patches, gum, and nasal sprays. Use these products only as directed. Do not replace cigarette smoking with electronic cigarettes (commonly called e-cigarettes). The safety of e-cigarettes is unknown, and some may contain harmful chemicals. FOR MORE INFORMATION  American Lung  Association: www.lung.org  American Cancer Society: www.cancer.org   This information is not intended to replace advice given to you by your health care provider. Make sure you discuss any questions you have with your health care provider.   Document Released: 07/05/2004 Document Revised: 03/18/2013 Document Reviewed: 11/17/2012 Elsevier Interactive Patient Education 2016 [employer].  Steps to Quit Smoking  Smoking tobacco can be harmful to your health and can affect almost every organ in your body. Smoking puts you, and those around you, at risk for developing many serious chronic diseases. Quitting smoking is difficult, but it is one of the best things that you can do for your health. It is never too late to quit. WHAT ARE THE BENEFITS OF QUITTING SMOKING? When you quit smoking, you lower your risk of developing serious diseases and conditions, such as:  Lung cancer or lung disease, such as COPD.  Heart disease.  Stroke.  Heart attack.  Infertility.  Osteoporosis and bone fractures. Additionally, symptoms such as coughing, wheezing, and shortness of breath may get better when you quit. You may also find that you get sick less often because your body is stronger at fighting off colds and infections. If you are pregnant, quitting smoking can help to reduce your chances of having a baby of low birth weight. HOW DO I GET READY TO QUIT? When you decide to quit smoking, create a plan to make sure that you are successful. Before you quit:  Pick a date to quit. Set a date within the next two weeks to give you time to prepare.  Write down the reasons why you are quitting. Keep this list in places where you will see it often, such as on your bathroom mirror or in your car or wallet.  Identify the people, places, things, and activities that make you want to smoke (triggers) and avoid them. Make sure to take these actions:  Throw away all cigarettes at home, at work, and in your  car.  Throw away smoking accessories, such as Scientist, research (medical).  Clean your car and make sure to empty the ashtray.  Clean your home, including curtains and carpets.  Tell your family, friends, and coworkers that you are quitting. Support from your loved ones can make quitting easier.  Talk with your health care provider about your options for quitting smoking.  Find out what treatment options are covered by your health insurance. WHAT STRATEGIES CAN I USE TO QUIT SMOKING?  Talk with your healthcare provider about different strategies to quit smoking. Some strategies include:  Quitting smoking altogether instead of gradually lessening how much you smoke over a period of time. Research shows that quitting "cold [place]" is more successful than gradually quitting.  Attending in-person counseling to help you build problem-solving skills. You are more likely to have success in quitting if you attend several counseling sessions. Even short sessions of 10 minutes can be effective.  Finding resources and support systems that can help you to quit smoking and remain smoke-free after you quit. These resources are most helpful when you use them often. They can include:  Online chats with a Social worker.  Telephone quitlines.  Printed Furniture conservator/restorer.  Support groups or  group counseling.  Text messaging programs.  Mobile phone applications.  Taking medicines to help you quit smoking. (If you are pregnant or breastfeeding, talk with your health care provider first.) Some medicines contain nicotine and some do not. Both types of medicines help with cravings, but the medicines that include nicotine help to relieve withdrawal symptoms. Your health care provider may recommend:  Nicotine patches, gum, or lozenges.  Nicotine inhalers or sprays.  Non-nicotine medicine that is taken by mouth. Talk with your health care provider about combining strategies, such as taking medicines while you  are also receiving in-person counseling. Using these two strategies together makes you more likely to succeed in quitting than if you used either strategy on its own. If you are pregnant or breastfeeding, talk with your health care provider about finding counseling or other support strategies to quit smoking. Do not take medicine to help you quit smoking unless told to do so by your health care provider. WHAT THINGS CAN I DO TO MAKE IT EASIER TO QUIT? Quitting smoking might feel overwhelming at first, but there is a lot that you can do to make it easier. Take these important actions:  Reach out to your family and friends and ask that they support and encourage you during this time. Call telephone quitlines, reach out to support groups, or work with a counselor for support.  Ask people who smoke to avoid smoking around you.  Avoid places that trigger you to smoke, such as bars, parties, or smoke-break areas at work.  Spend time around people who do not smoke.  Lessen stress in your life, because stress can be a smoking trigger for some people. To lessen stress, try:  Exercising regularly.  Deep-breathing exercises.  Yoga.  Meditating.  Performing a body scan. This involves closing your eyes, scanning your body from head to toe, and noticing which parts of your body are particularly tense. Purposefully relax the muscles in those areas.  Download or purchase mobile phone or tablet apps (applications) that can help you stick to your quit plan by providing reminders, tips, and encouragement. There are many free apps, such as QuitGuide from the [employer] Office manager for Disease Control and Prevention). You can find other support for quitting smoking (smoking cessation) through smokefree.gov and other websites. HOW WILL I FEEL WHEN I QUIT SMOKING? Within the first 24 hours of quitting smoking, you may start to feel some withdrawal symptoms. These symptoms are usually most noticeable 2-3 days after  quitting, but they usually do not last beyond 2-3 weeks. Changes or symptoms that you might experience include:  Mood swings.  Restlessness, anxiety, or irritation.  Difficulty concentrating.  Dizziness.  Strong cravings for sugary foods in addition to nicotine.  Mild weight gain.  Constipation.  Nausea.  Coughing or a sore throat.  Changes in how your medicines work in your body.  A depressed mood.  Difficulty sleeping (insomnia). After the first 2-3 weeks of quitting, you may start to notice more positive results, such as:  Improved sense of smell and taste.  Decreased coughing and sore throat.  Slower heart rate.  Lower blood pressure.  Clearer skin.  The ability to breathe more easily.  Fewer sick days. Quitting smoking is very challenging for most people. Do not get discouraged if you are not successful the first time. Some people need to make many attempts to quit before they achieve long-term success. Do your best to stick to your quit plan, and talk with your health care  provider if you have any questions or concerns.   This information is not intended to replace advice given to you by your health care provider. Make sure you discuss any questions you have with your health care provider.   Document Released: 05/22/2001 Document Revised: 10/12/2014 Document Reviewed: 10/12/2014 Elsevier Interactive Patient Education 2016 [employer].  Smoking Cessation, Tips for Success If you are ready to quit smoking, congratulations! You have chosen to help yourself be healthier. Cigarettes bring nicotine, tar, carbon monoxide, and other irritants into your body. Your lungs, heart, and blood vessels will be able to work better without these poisons. There are many different ways to quit smoking. Nicotine gum, nicotine patches, a nicotine inhaler, or nicotine nasal spray can help with physical craving. Hypnosis, support groups, and medicines help break the habit of  smoking. WHAT THINGS CAN I DO TO MAKE QUITTING EASIER?  Here are some tips to help you quit for good:  Pick a date when you will quit smoking completely. Tell all of your friends and family about your plan to quit on that date.  Do not try to slowly cut down on the number of cigarettes you are smoking. Pick a quit date and quit smoking completely starting on that day.  Throw away all cigarettes.   Clean and remove all ashtrays from your home, work, and car.  On a card, write down your reasons for quitting. Carry the card with you and read it when you get the urge to smoke.  Cleanse your body of nicotine. Drink enough water and fluids to keep your urine clear or pale yellow. Do this after quitting to flush the nicotine from your body.  Learn to predict your moods. Do not let a bad situation be your excuse to have a cigarette. Some situations in your life might tempt you into wanting a cigarette.  Never have "just one" cigarette. It leads to wanting another and another. Remind yourself of your decision to quit.  Change habits associated with smoking. If you smoked while driving or when feeling stressed, try other activities to replace smoking. Stand up when drinking your coffee. Brush your teeth after eating. Sit in a different chair when you read the paper. Avoid alcohol while trying to quit, and try to drink fewer caffeinated beverages. Alcohol and caffeine may urge you to smoke.  Avoid foods and drinks that can trigger a desire to smoke, such as sugary or spicy foods and alcohol.  Ask people who smoke not to smoke around you.  Have something planned to do right after eating or having a cup of coffee. For example, plan to take a walk or exercise.  Try a relaxation exercise to calm you down and decrease your stress. Remember, you may be tense and nervous for the first 2 weeks after you quit, but this will pass.  Find new activities to keep your hands busy. Play with a pen, coin, or  rubber band. Doodle or draw things on paper.  Brush your teeth right after eating. This will help cut down on the craving for the taste of tobacco after meals. You can also try mouthwash.   Use oral substitutes in place of cigarettes. Try using lemon drops, carrots, cinnamon sticks, or chewing gum. Keep them handy so they are available when you have the urge to smoke.  When you have the urge to smoke, try deep breathing.  Designate your home as a nonsmoking area.  If you are a heavy smoker, ask your  health care provider about a prescription for nicotine chewing gum. It can ease your withdrawal from nicotine.  Reward yourself. Set aside the cigarette money you save and buy yourself something nice.  Look for support from others. Join a support group or smoking cessation program. Ask someone at home or at work to help you with your plan to quit smoking.  Always ask yourself, "Do I need this cigarette or is this just a reflex?" Tell yourself, "Today, I choose not to smoke," or "I do not want to smoke." You are reminding yourself of your decision to quit.  Do not replace cigarette smoking with electronic cigarettes (commonly called e-cigarettes). The safety of e-cigarettes is unknown, and some may contain harmful chemicals.  If you relapse, do not give up! Plan ahead and think about what you will do the next time you get the urge to smoke. HOW WILL I FEEL WHEN I QUIT SMOKING? You may have symptoms of withdrawal because your body is used to nicotine (the addictive substance in cigarettes). You may crave cigarettes, be irritable, feel very hungry, cough often, get headaches, or have difficulty concentrating. The withdrawal symptoms are only temporary. They are strongest when you first quit but will go away within 10-14 days. When withdrawal symptoms occur, stay in control. Think about your reasons for quitting. Remind yourself that these are signs that your body is healing and getting used to being  without cigarettes. Remember that withdrawal symptoms are easier to treat than the major diseases that smoking can cause.  Even after the withdrawal is over, expect periodic urges to smoke. However, these cravings are generally short lived and will go away whether you smoke or not. Do not smoke! WHAT RESOURCES ARE AVAILABLE TO HELP ME QUIT SMOKING? Your health care provider can direct you to community resources or hospitals for support, which may include:  Group support.  Education.  Hypnosis.  Therapy.   This information is not intended to replace advice given to you by your health care provider. Make sure you discuss any questions you have with your health care provider.   Document Released: 02/24/2004 Document Revised: 06/18/2014 Document Reviewed: 11/13/2012 Elsevier Interactive Patient Education [employer].

## 2018-09-08 ENCOUNTER — Telehealth: Payer: Self-pay | Admitting: Behavioral Health

## 2018-09-08 NOTE — Telephone Encounter (Signed)
Patient called and left a voicemail about nicotine patches.  Called her back, left voicemail to call the office back and left call back number. Pricilla Riffle RN

## 2018-09-22 ENCOUNTER — Encounter: Payer: No Typology Code available for payment source | Admitting: Family

## 2018-09-29 ENCOUNTER — Ambulatory Visit: Payer: No Typology Code available for payment source | Admitting: Family

## 2018-10-03 ENCOUNTER — Other Ambulatory Visit: Payer: Self-pay | Admitting: Infectious Diseases

## 2018-10-03 DIAGNOSIS — B20 Human immunodeficiency virus [HIV] disease: Secondary | ICD-10-CM

## 2018-10-17 ENCOUNTER — Other Ambulatory Visit: Payer: Self-pay | Admitting: Infectious Diseases

## 2018-10-17 DIAGNOSIS — Z1231 Encounter for screening mammogram for malignant neoplasm of breast: Secondary | ICD-10-CM

## 2018-12-10 ENCOUNTER — Other Ambulatory Visit: Payer: Self-pay

## 2018-12-10 ENCOUNTER — Ambulatory Visit
Admission: RE | Admit: 2018-12-10 | Discharge: 2018-12-10 | Disposition: A | Discharge status: Home / Self Care | Payer: Medicaid Other | Source: Ambulatory Visit | Attending: Infectious Diseases | Admitting: Infectious Diseases

## 2018-12-10 DIAGNOSIS — Z1231 Encounter for screening mammogram for malignant neoplasm of breast: Secondary | ICD-10-CM

## 2018-12-15 ENCOUNTER — Other Ambulatory Visit: Payer: Self-pay | Admitting: Infectious Diseases

## 2018-12-15 DIAGNOSIS — R928 Other abnormal and inconclusive findings on diagnostic imaging of breast: Secondary | ICD-10-CM

## 2018-12-22 ENCOUNTER — Other Ambulatory Visit: Payer: Self-pay | Admitting: Infectious Diseases

## 2018-12-24 ENCOUNTER — Other Ambulatory Visit: Payer: Self-pay

## 2018-12-24 ENCOUNTER — Ambulatory Visit
Admission: RE | Admit: 2018-12-24 | Discharge: 2018-12-24 | Disposition: A | Discharge status: Home / Self Care | Payer: Medicaid Other | Source: Ambulatory Visit | Attending: Infectious Diseases | Admitting: Infectious Diseases

## 2018-12-24 DIAGNOSIS — R928 Other abnormal and inconclusive findings on diagnostic imaging of breast: Secondary | ICD-10-CM

## 2019-01-08 ENCOUNTER — Ambulatory Visit: Payer: Medicaid Other | Admitting: Obstetrics and Gynecology

## 2019-01-09 NOTE — Progress Notes (Signed)
The patient did not keep her appointment today.  Noni Saupe I, NP 01/09/2019 9:50 AM

## 2019-02-23 ENCOUNTER — Other Ambulatory Visit: Payer: Medicaid Other

## 2019-02-24 ENCOUNTER — Other Ambulatory Visit: Payer: Self-pay

## 2019-02-24 ENCOUNTER — Other Ambulatory Visit: Payer: Medicaid Other

## 2019-02-24 DIAGNOSIS — B20 Human immunodeficiency virus [HIV] disease: Secondary | ICD-10-CM

## 2019-02-26 LAB — COMPREHENSIVE METABOLIC PANEL
AG Ratio: 1.3 (calc) (ref 1.0–2.5)
ALT: 20 U/L (ref 6–29)
AST: 19 U/L (ref 10–35)
Albumin: 4.3 g/dL (ref 3.6–5.1)
Alkaline phosphatase (APISO): 105 U/L (ref 37–153)
BUN: 16 mg/dL (ref 7–25)
CO2: 28 mmol/L (ref 20–32)
Calcium: 10 mg/dL (ref 8.6–10.4)
Chloride: 102 mmol/L (ref 98–110)
Creat: 0.98 mg/dL (ref 0.50–1.05)
Globulin: 3.2 g/dL (calc) (ref 1.9–3.7)
Glucose, Bld: 98 mg/dL (ref 65–99)
Potassium: 4.1 mmol/L (ref 3.5–5.3)
Sodium: 138 mmol/L (ref 135–146)
Total Bilirubin: 0.3 mg/dL (ref 0.2–1.2)
Total Protein: 7.5 g/dL (ref 6.1–8.1)

## 2019-02-26 LAB — HELPER T-LYMPH-CD4 (ARMC ONLY)
% CD 4 Pos. Lymph.: 25.3 % — ABNORMAL LOW (ref 30.8–58.5)
Absolute CD 4 Helper: 835 /uL (ref 359–1519)
Basophils Absolute: 0.1 10*3/uL (ref 0.0–0.2)
Basos: 1 %
EOS (ABSOLUTE): 0.1 10*3/uL (ref 0.0–0.4)
Eos: 1 %
Hematocrit: 36.3 % (ref 34.0–46.6)
Hemoglobin: 12 g/dL (ref 11.1–15.9)
Immature Grans (Abs): 0 10*3/uL (ref 0.0–0.1)
Immature Granulocytes: 0 %
Lymphocytes Absolute: 3.3 10*3/uL — ABNORMAL HIGH (ref 0.7–3.1)
Lymphs: 37 %
MCH: 31.5 pg (ref 26.6–33.0)
MCHC: 33.1 g/dL (ref 31.5–35.7)
MCV: 95 fL (ref 79–97)
Monocytes Absolute: 0.4 10*3/uL (ref 0.1–0.9)
Monocytes: 5 %
Neutrophils Absolute: 5.1 10*3/uL (ref 1.4–7.0)
Neutrophils: 56 %
Platelets: 297 10*3/uL (ref 150–450)
RBC: 3.81 x10E6/uL (ref 3.77–5.28)
RDW: 12.4 % (ref 11.7–15.4)
WBC: 9 10*3/uL (ref 3.4–10.8)

## 2019-02-26 LAB — CBC
HCT: 35.3 % (ref 35.0–45.0)
Hemoglobin: 11.8 g/dL (ref 11.7–15.5)
MCH: 31 pg (ref 27.0–33.0)
MCHC: 33.4 g/dL (ref 32.0–36.0)
MCV: 92.7 fL (ref 80.0–100.0)
MPV: 10.9 fL (ref 7.5–12.5)
Platelets: 300 10*3/uL (ref 140–400)
RBC: 3.81 10*6/uL (ref 3.80–5.10)
RDW: 12.4 % (ref 11.0–15.0)
WBC: 8.9 10*3/uL (ref 3.8–10.8)

## 2019-02-26 LAB — HIV-1 RNA QUANT-NO REFLEX-BLD
HIV 1 RNA Quant: <20 copies/mL
HIV-1 RNA Quant, Log: <1.3 Log copies/mL

## 2019-03-09 ENCOUNTER — Encounter: Payer: Medicaid Other | Admitting: Family

## 2019-03-12 ENCOUNTER — Encounter: Payer: Medicaid Other | Admitting: Family

## 2019-03-16 ENCOUNTER — Ambulatory Visit (INDEPENDENT_AMBULATORY_CARE_PROVIDER_SITE_OTHER): Payer: Medicaid Other | Admitting: Family

## 2019-03-16 ENCOUNTER — Encounter: Payer: Self-pay | Admitting: *Deleted

## 2019-03-16 ENCOUNTER — Encounter: Payer: Self-pay | Admitting: Family

## 2019-03-16 ENCOUNTER — Other Ambulatory Visit (HOSPITAL_COMMUNITY)
Admission: RE | Admit: 2019-03-16 | Discharge: 2019-03-16 | Disposition: A | Discharge status: Home / Self Care | Payer: Medicaid Other | Source: Ambulatory Visit | Attending: Family | Admitting: Family

## 2019-03-16 ENCOUNTER — Other Ambulatory Visit: Payer: Self-pay

## 2019-03-16 VITALS — BP 143/82 | HR 76 | Temp 98.0°F

## 2019-03-16 DIAGNOSIS — B20 Human immunodeficiency virus [HIV] disease: Secondary | ICD-10-CM | POA: Insufficient documentation

## 2019-03-16 DIAGNOSIS — Z113 Encounter for screening for infections with a predominantly sexual mode of transmission: Secondary | ICD-10-CM

## 2019-03-16 DIAGNOSIS — Z23 Encounter for immunization: Secondary | ICD-10-CM | POA: Diagnosis not present

## 2019-03-16 DIAGNOSIS — Z79899 Other long term (current) drug therapy: Secondary | ICD-10-CM

## 2019-03-16 DIAGNOSIS — Z720 Tobacco use: Secondary | ICD-10-CM

## 2019-03-16 DIAGNOSIS — Z0000 Encounter for general adult medical examination without abnormal findings: Secondary | ICD-10-CM | POA: Diagnosis not present

## 2019-03-16 MED ORDER — GENVOYA 150-150-200-10 MG PO TABS: 1.0000 | ORAL_TABLET | Freq: Every day | ORAL | 6 refills | Status: DC

## 2019-03-16 NOTE — Assessment & Plan Note (Signed)
   Influenza vaccination updated today.  Attempt Cologuard for colon cancer screening.  Recommend follow-up with gynecology for cervical cancer screening.  Discussed importance of safe sexual practice to reduce risk of acquisition/transmission of STI.

## 2019-03-16 NOTE — Progress Notes (Signed)
Subjective:    Patient ID: Vanessa Browning, female    DOB: 25-Jun-1965, 53 y.o.   MRN: [id number]  Chief Complaint  Patient presents with   Follow-up    doing well, had fall at work (workers' comp)     HPI:  Vanessa Browning is a 53 y.o. female with HIV disease who was last seen in the office on 09/04/2018 with good adherence and tolerance to her ART regimen of Genvoya.  Viral load at the time was undetectable with CD4 count of 610.  Most recent blood work completed on 02/24/2019 with viral load remaining undetectable and CD4 count of 835.  Healthcare maintenance due includes influenza vaccination, cervical cancer screening, and colon cancer screening.  Mammogram last completed on 12/10/2018.   [name] continues to take her Genvoya as prescribed with no adverse side effects or missed doses.  Overall feeling well today with no new concerns/complaints. Denies fevers, chills, night sweats, headaches, changes in vision, neck pain/stiffness, nausea, diarrhea, vomiting, lesions or rashes.  [name] has no problems obtaining her medication from the pharmacy and remains covered through [facility].  Denies any feelings of being down, depressed, or hopeless recently.  No recreational or illicit drug use or alcohol consumption.  She continues to smoke on a daily basis.  Not currently sexually active.  She enjoys taking care of her grandchildren at times.   Allergies  Allergen Reactions   Dexilant [Dexlansoprazole]     Hands swelling       Outpatient Medications Prior to Visit  Medication Sig Dispense Refill   nicotine (NICODERM CQ) 21 mg/24hr patch Place 1 patch (21 mg total) onto the skin daily. 21 patch 1   GENVOYA 150-150-200-10 MG TABS tablet TAKE 1 TABLET BY MOUTH DAILY WITH BREAKFAST 30 tablet 5   cyclobenzaprine (FLEXERIL) 10 MG tablet TK 1 T PO HS PRF MUSCLE SPASMS AND PAIN     No facility-administered medications prior to visit.      Past Medical History:  Diagnosis  Date   Allergy    seasonal   GERD (gastroesophageal reflux disease)    HIV (human immunodeficiency virus infection) ([place])    Hyperlipidemia    Kidney stone    Substance abuse ([place])      Past Surgical History:  Procedure Laterality Date   APPENDECTOMY     APPENDECTOMY     CHOLECYSTECTOMY     INDUCED ABORTION     TONSILLECTOMY      Review of Systems  Constitutional: Negative for appetite change, chills, diaphoresis, fatigue, fever and unexpected weight change.  Eyes:       Negative for acute change in vision  Respiratory: Negative for chest tightness, shortness of breath and wheezing.   Cardiovascular: Negative for chest pain.  Gastrointestinal: Negative for diarrhea, nausea and vomiting.  Genitourinary: Negative for dysuria, pelvic pain and vaginal discharge.  Musculoskeletal: Negative for neck pain and neck stiffness.  Skin: Negative for rash.  Neurological: Negative for seizures, syncope, weakness and headaches.  Hematological: Negative for adenopathy. Does not bruise/bleed easily.  Psychiatric/Behavioral: Negative for hallucinations.      Objective:    BP (!) 143/82    Pulse 76    Temp 98 F (36.7 C) (Oral)    SpO2 100%  Nursing note and vital signs reviewed.  Physical Exam Constitutional:      General: She is not in acute distress.    Appearance: She is well-developed.  Eyes:     Conjunctiva/sclera: Conjunctivae normal.  Neck:     Musculoskeletal: Neck supple.  Cardiovascular:     Rate and Rhythm: Normal rate and regular rhythm.     Heart sounds: Normal heart sounds. No murmur. No friction rub. No gallop.   Pulmonary:     Effort: Pulmonary effort is normal. No respiratory distress.     Breath sounds: Normal breath sounds. No wheezing or rales.  Chest:     Chest wall: No tenderness.  Abdominal:     General: Bowel sounds are normal.     Palpations: Abdomen is soft.     Tenderness: There is no abdominal tenderness.  Lymphadenopathy:      Cervical: No cervical adenopathy.  Skin:    General: Skin is warm and dry.     Findings: No rash.  Neurological:     Mental Status: She is alert and oriented to person, place, and time.  Psychiatric:        Behavior: Behavior normal.        Thought Content: Thought content normal.        Judgment: Judgment normal.      Depression screen [facility] 2/9 03/16/2019 03/10/2018 08/26/2017 07/29/2017 12/29/2014  Decreased Interest 0 0 0 0 0  Down, Depressed, Hopeless 1 0 0 0 0  PHQ - 2 Score 1 0 0 0 0       Assessment & Plan:    Patient Active Problem List   Diagnosis Date Noted   Healthcare maintenance 09/04/2018   LGSIL on Pap smear of cervix 11/06/2017   Viral upper respiratory tract infection 07/29/2017   Chest pain, unspecified 11/10/2013   Unspecified symptom associated with female genital organs 03/11/2013   Hyperlipidemia 10/01/2012   Tobacco abuse 05/31/2011   Depression 06/15/2010   ACUTE CYSTITIS 11/18/2009   HOT FLASHES 10/21/2009   VAGINITIS, ATROPHIC, MILD 07/07/2009   HERNIATED CERVICAL DISC 12/09/2008   NEUROPATHY 08/19/2008   Human immunodeficiency virus (HIV) disease ([place]) 01/13/2007   INSOMNIA, CHRONIC 01/13/2007   ALLERGIC RHINITIS 01/13/2007   GERD 01/13/2007     Problem List Items Addressed This Visit      Other   Tobacco abuse (Chronic)    [name] continues to smoke on a daily basis and working on reducing her tobacco use which is challenged by her stress levels at times.  Discussed importance of tobacco cessation to reduce risk of cardiovascular, respiratory, and malignant diseases in the future.  Continue to monitor.      Human immunodeficiency virus (HIV) disease ([place]) - Primary    [name] has well-controlled HIV disease with good adherence and tolerance to her ART regimen of Genvoya.  No signs/symptoms of opportunistic infection or progressive HIV disease.  She has no problems obtaining her medication from the pharmacy.  Continue  current dose of Genvoya.  Plan for follow-up in 6 months or sooner if needed with lab work 1 to 2 weeks prior to appointment on same day.      Relevant Medications   elvitegravir-cobicistat-emtricitabine-tenofovir (GENVOYA) 150-150-200-10 MG TABS tablet   Other Relevant Orders   T-helper cell (CD4)- ([facility] clinic only)   CBC   HIV-1 RNA quant-no reflex-bld   Comprehensive metabolic panel   Healthcare maintenance     Influenza vaccination updated today.  Attempt Cologuard for colon cancer screening.  Recommend follow-up with gynecology for cervical cancer screening.  Discussed importance of safe sexual practice to reduce risk of acquisition/transmission of STI.       Other Visit Diagnoses  Pharmacologic therapy       Relevant Orders   Lipid panel   Screening for STDs (sexually transmitted diseases)       Relevant Orders   RPR   Need for immunization against influenza       Relevant Orders   Flu Vaccine QUAD 36+ mos IM (Completed)       I have discontinued [name]'s cyclobenzaprine. I have also changed her Genvoya. Additionally, I am having her maintain her nicotine.   Meds ordered this encounter  Medications   elvitegravir-cobicistat-emtricitabine-tenofovir (GENVOYA) 150-150-200-10 MG TABS tablet    Sig: Take 1 tablet by mouth daily with breakfast.    Dispense:  30 tablet    Refill:  6    Order Specific Question:   Supervising Provider    Answer:   Carlyle Basques [[id number]]     Follow-up: Return in about 6 months (around 09/14/2019), or if symptoms worsen or fail to improve.   Terri Piedra, MSN, FNP-C Nurse Practitioner [facility] for Infectious Disease [place] number: [phone]

## 2019-03-16 NOTE — Assessment & Plan Note (Signed)
[name] continues to smoke on a daily basis and working on reducing her tobacco use which is challenged by her stress levels at times.  Discussed importance of tobacco cessation to reduce risk of cardiovascular, respiratory, and malignant diseases in the future.  Continue to monitor.

## 2019-03-16 NOTE — Addendum Note (Signed)
Addended by: Dolan Amen D on: 03/16/2019 03:22 PM   Modules accepted: Orders

## 2019-03-16 NOTE — Assessment & Plan Note (Addendum)
[name] has well-controlled HIV disease with good adherence and tolerance to her ART regimen of Genvoya.  No signs/symptoms of opportunistic infection or progressive HIV disease.  She has no problems obtaining her medication from the pharmacy.  Continue current dose of Genvoya.  Plan for follow-up in 6 months or sooner if needed with lab work 1 to 2 weeks prior to appointment on same day.

## 2019-03-16 NOTE — Addendum Note (Signed)
Addended by: Landis Gandy on: 03/16/2019 03:00 PM   Modules accepted: Orders

## 2019-03-16 NOTE — Patient Instructions (Signed)
Nice to see you.  We will continue your Genvoya.  Refills will be sent to the pharmacy.  Continue to work on stopping smoking.   We will check on Cologuard for colon cancer screening.  Follow up with gynecology for cervical cancer screening.  Plan for office follow up in 6 months or sooner if needed with lab work 1-2 weeks prior to appointment.   Have a great day and stay safe!

## 2019-03-19 LAB — URINE CYTOLOGY ANCILLARY ONLY
Chlamydia: NEGATIVE
Neisseria Gonorrhea: NEGATIVE

## 2019-03-26 ENCOUNTER — Other Ambulatory Visit (HOSPITAL_COMMUNITY)
Admission: RE | Admit: 2019-03-26 | Discharge: 2019-03-26 | Disposition: A | Discharge status: Home / Self Care | Payer: Medicaid Other | Source: Ambulatory Visit | Attending: Infectious Diseases | Admitting: Infectious Diseases

## 2019-03-26 ENCOUNTER — Encounter: Payer: Self-pay | Admitting: Infectious Diseases

## 2019-03-26 ENCOUNTER — Other Ambulatory Visit: Payer: Self-pay

## 2019-03-26 ENCOUNTER — Ambulatory Visit (INDEPENDENT_AMBULATORY_CARE_PROVIDER_SITE_OTHER): Payer: Medicaid Other | Admitting: Infectious Diseases

## 2019-03-26 DIAGNOSIS — B20 Human immunodeficiency virus [HIV] disease: Secondary | ICD-10-CM | POA: Diagnosis present

## 2019-03-26 DIAGNOSIS — Z113 Encounter for screening for infections with a predominantly sexual mode of transmission: Secondary | ICD-10-CM | POA: Diagnosis present

## 2019-03-26 DIAGNOSIS — R87612 Low grade squamous intraepithelial lesion on cytologic smear of cervix (LGSIL): Secondary | ICD-10-CM | POA: Diagnosis not present

## 2019-03-26 DIAGNOSIS — Z124 Encounter for screening for malignant neoplasm of cervix: Secondary | ICD-10-CM

## 2019-03-26 DIAGNOSIS — Z111 Encounter for screening for respiratory tuberculosis: Secondary | ICD-10-CM | POA: Diagnosis not present

## 2019-03-26 NOTE — Progress Notes (Signed)
      Subjective:    Vanessa Browning is a 53 y.o. female here for an annual pelvic exam and pap smear.   Review of Systems: Current GYN complaints or concerns: none. No sexual partners or encounters for > 5 years. History of previous abnormal pap smear results. Patient denies any abdominal/pelvic pain, problems with bowel movements, urination, vaginal discharge or intercourse.   Requesting TB screening paperwork to be completed. .   Past Medical History:  Diagnosis Date  . Allergy    seasonal  . GERD (gastroesophageal reflux disease)   . HIV (human immunodeficiency virus infection) ([place])   . Hyperlipidemia   . Kidney stone   . Substance abuse [facility])     Gynecologic History: G6P0010  No LMP recorded. Patient is postmenopausal. Contraception: post menopausal status Last Pap: 2019. Results were: abnormal - LSIL Anal Intercourse: no Last Mammogram: 12/2018. Results were: abnormal - diagnostic mammo and ultrasound done; results with fibrocystic breast changes.    Objective:  Physical Exam  Constitutional: Well developed, well nourished, no acute distress. She is alert and oriented x3.  Pelvic: External genitalia is normal in appearance. The vagina is normal in appearance. The cervix is bulbous and easily visualized. No CMT, normal expected cervical mucus present. Bimanual exam reveals uterus that is felt to be normal size, shape, and contour. No adnexal masses or tenderness noted. Breasts: symmetrical in contour, shape and texture. No palpable masses/nodules. No nipple discharge.  Psych: She has a normal mood and affect.    Assessment & Plan:    Problem List Items Addressed This Visit      Unprioritized   Human immunodeficiency virus (HIV) disease ([place]) - Primary (Chronic)    Well controlled on Genvoya. She will follow up as scheduled with Terri Piedra, NP.   Contraception = s/p menopausal   Mammogram = scheduled, no family history of breast cancer. Discussed       LGSIL on Pap smear of cervix    Underwent colposcopy in 10-2017 with recommendations to proceed with LEEP but she never went.   Cytology with HPV collected today. Normal pelvic and bimanual exam with exception of some atrophic changes to vaginal walls and cervix. Os visualized easily.   Discussed recommended screening interval for women living with HIV disease. Will follow up results per ACCP/DHHS guideline. She likely will require referral back to GYN program.       Screening-pulmonary TB    Requested TB screening; will collect Quantiferon GOLD. Forms completed with the patient and she will pick up once final results are available.       Relevant Orders   QuantiFERON-TB Gold Plus    Other Visit Diagnoses    Screening for cervical cancer       Relevant Orders   Cytology - PAP( [place])   Screen for STD (sexually transmitted disease)       Relevant Orders   Cervicovaginal ancillary only( [place])      Janene Madeira, MSN, NP-C [place] for [place] Office: [phone] Pager: [phone]  03/26/19 4:18 PM

## 2019-03-26 NOTE — Patient Instructions (Signed)
It looks like you missed a follow up appointment with the Gynecology team last year for your previous abnormal pap smear results. We may need to refer you back there depending on what today's results show.   They recommended a LEEP procedure - which is very important to prevent progression to cervical cancer.   We will talk more about your next steps once your test results are back from today.

## 2019-03-26 NOTE — Assessment & Plan Note (Addendum)
Underwent colposcopy in 10-2017 with recommendations to proceed with LEEP but she never went.   Cytology with HPV collected today. Normal pelvic and bimanual exam with exception of some atrophic changes to vaginal walls and cervix. Os visualized easily.   Discussed recommended screening interval for women living with HIV disease. Will follow up results per ACCP/DHHS guideline. She likely will require referral back to GYN program.

## 2019-03-26 NOTE — Assessment & Plan Note (Addendum)
Well controlled on Genvoya. She will follow up as scheduled with Terri Piedra, NP.   Contraception = s/p menopausal   Mammogram = scheduled, no family history of breast cancer. Discussed

## 2019-03-26 NOTE — Assessment & Plan Note (Signed)
Requested TB screening; will collect Quantiferon GOLD. Forms completed with the patient and she will pick up once final results are available.

## 2019-03-28 LAB — QUANTIFERON-TB GOLD PLUS
Mitogen-NIL: >10 IU/mL
NIL: 0.1 IU/mL
QuantiFERON-TB Gold Plus: NEGATIVE
TB1-NIL: <0 IU/mL
TB2-NIL: <0 IU/mL

## 2019-03-30 NOTE — Progress Notes (Signed)
Please give Vanessa Browning a call to let her know her TB screening was negative on her blood work and she can come pick up her paperwork at the front desk. Thank you!

## 2019-03-31 LAB — CERVICOVAGINAL ANCILLARY ONLY
Chlamydia: NEGATIVE
Comment: NEGATIVE
Comment: NEGATIVE
Comment: NORMAL
Neisseria Gonorrhea: NEGATIVE
Trichomonas: NEGATIVE

## 2019-04-03 ENCOUNTER — Telehealth: Payer: Self-pay

## 2019-04-03 LAB — CYTOLOGY - PAP
Comment: NEGATIVE
Comment: NEGATIVE
Comment: NEGATIVE
Diagnosis: NEGATIVE
HPV 16: NEGATIVE
HPV 18 / 45: NEGATIVE
High risk HPV: POSITIVE — AB

## 2019-04-03 NOTE — Telephone Encounter (Signed)
Patient called office to follow up on Tb lab. Relayed Vanessa Browning's message regarding results. Informed patient that tb test was neg, and was also able to relay message regarding pap smear results. Patient did not have questions about lab results. Informed patient that Vanessa [place] left paper work with front desk for to pick up also  Vanessa Browning, [place]

## 2019-04-03 NOTE — Progress Notes (Signed)
Please cal Vanessa Browning to let her know that her pap smear results were normal and no concern for cervical cancer.  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 I recommend that she repeat this exam in 1 year. Happy to answer further questions should she have any.  Thank you.

## 2019-04-07 ENCOUNTER — Telehealth: Payer: Self-pay | Admitting: *Deleted

## 2019-04-07 NOTE — Telephone Encounter (Signed)
Per Colletta Maryland called the patient to tell her that recent PAP was normal, she tested positive for HPV but not one that causes cancer and she should have PAP done again in 1 year.

## 2019-07-09 ENCOUNTER — Other Ambulatory Visit: Payer: Self-pay | Admitting: Family

## 2019-07-09 DIAGNOSIS — B20 Human immunodeficiency virus [HIV] disease: Secondary | ICD-10-CM

## 2019-09-14 ENCOUNTER — Other Ambulatory Visit: Payer: Self-pay

## 2019-09-14 ENCOUNTER — Other Ambulatory Visit: Payer: Medicaid Other

## 2019-09-14 DIAGNOSIS — B20 Human immunodeficiency virus [HIV] disease: Secondary | ICD-10-CM

## 2019-09-14 DIAGNOSIS — Z113 Encounter for screening for infections with a predominantly sexual mode of transmission: Secondary | ICD-10-CM

## 2019-09-14 DIAGNOSIS — Z79899 Other long term (current) drug therapy: Secondary | ICD-10-CM

## 2019-09-15 LAB — T-HELPER CELL (CD4) - (RCID CLINIC ONLY)
CD4 % Helper T Cell: 22 % — ABNORMAL LOW (ref 33–65)
CD4 T Cell Abs: 525 /uL (ref 400–1790)

## 2019-09-16 LAB — HIV-1 RNA QUANT-NO REFLEX-BLD
HIV 1 RNA Quant: <20 copies/mL
HIV-1 RNA Quant, Log: <1.3 Log copies/mL

## 2019-09-16 LAB — COMPREHENSIVE METABOLIC PANEL
AG Ratio: 1.1 (calc) (ref 1.0–2.5)
ALT: 18 U/L (ref 6–29)
AST: 18 U/L (ref 10–35)
Albumin: 4.3 g/dL (ref 3.6–5.1)
Alkaline phosphatase (APISO): 107 U/L (ref 37–153)
BUN: 14 mg/dL (ref 7–25)
CO2: 29 mmol/L (ref 20–32)
Calcium: 9.8 mg/dL (ref 8.6–10.4)
Chloride: 105 mmol/L (ref 98–110)
Creat: 0.68 mg/dL (ref 0.50–1.05)
Globulin: 3.9 g/dL (calc) — ABNORMAL HIGH (ref 1.9–3.7)
Glucose, Bld: 103 mg/dL — ABNORMAL HIGH (ref 65–99)
Potassium: 3.9 mmol/L (ref 3.5–5.3)
Sodium: 141 mmol/L (ref 135–146)
Total Bilirubin: 0.4 mg/dL (ref 0.2–1.2)
Total Protein: 8.2 g/dL — ABNORMAL HIGH (ref 6.1–8.1)

## 2019-09-16 LAB — CBC
HCT: 37.1 % (ref 35.0–45.0)
Hemoglobin: 12.3 g/dL (ref 11.7–15.5)
MCH: 30.4 pg (ref 27.0–33.0)
MCHC: 33.2 g/dL (ref 32.0–36.0)
MCV: 91.8 fL (ref 80.0–100.0)
MPV: 10.9 fL (ref 7.5–12.5)
Platelets: 290 10*3/uL (ref 140–400)
RBC: 4.04 10*6/uL (ref 3.80–5.10)
RDW: 12.5 % (ref 11.0–15.0)
WBC: 8.5 10*3/uL (ref 3.8–10.8)

## 2019-09-16 LAB — LIPID PANEL
Cholesterol: 221 mg/dL — ABNORMAL HIGH (ref <200)
HDL: 45 mg/dL — ABNORMAL LOW (ref >=50)
LDL Cholesterol (Calc): 150 mg/dL (calc) — ABNORMAL HIGH
Non-HDL Cholesterol (Calc): 176 mg/dL (calc) — ABNORMAL HIGH (ref <130)
Total CHOL/HDL Ratio: 4.9 (calc) (ref <5.0)
Triglycerides: 132 mg/dL (ref <150)

## 2019-09-16 LAB — RPR TITER: RPR Titer: 1:1 {titer} — ABNORMAL HIGH

## 2019-09-16 LAB — FLUORESCENT TREPONEMAL AB(FTA)-IGG-BLD: Fluorescent Treponemal ABS: REACTIVE — AB

## 2019-09-16 LAB — RPR: RPR Ser Ql: REACTIVE — AB

## 2019-09-25 ENCOUNTER — Telehealth: Payer: Self-pay

## 2019-09-25 NOTE — Telephone Encounter (Signed)
COVID-19 Pre-Screening Questions:09/25/19  Do you currently have a fever (>100 F), chills or unexplained body aches? NO  Are you currently experiencing new cough, shortness of breath, sore throat, runny nose? NO  .  Have you recently travelled outside the state of  in the last 14 days? NO  .  Have you been in contact with someone that is currently pending confirmation of Covid19 testing or has been confirmed to have the Covid19 virus?  NO  **If the patient answers NO to ALL questions -  advise the patient to please call the clinic before coming to the office should any symptoms develop.     

## 2019-09-28 ENCOUNTER — Ambulatory Visit (INDEPENDENT_AMBULATORY_CARE_PROVIDER_SITE_OTHER): Payer: Medicaid Other | Admitting: Family

## 2019-09-28 ENCOUNTER — Other Ambulatory Visit: Payer: Self-pay

## 2019-09-28 ENCOUNTER — Encounter: Payer: Self-pay | Admitting: Family

## 2019-09-28 VITALS — BP 126/83 | HR 67 | Temp 97.7°F | Ht 62.0 in | Wt 182.4 lb

## 2019-09-28 DIAGNOSIS — Z0000 Encounter for general adult medical examination without abnormal findings: Secondary | ICD-10-CM | POA: Diagnosis not present

## 2019-09-28 DIAGNOSIS — B20 Human immunodeficiency virus [HIV] disease: Secondary | ICD-10-CM | POA: Diagnosis present

## 2019-09-28 DIAGNOSIS — Z113 Encounter for screening for infections with a predominantly sexual mode of transmission: Secondary | ICD-10-CM | POA: Diagnosis not present

## 2019-09-28 MED ORDER — GENVOYA 150-150-200-10 MG PO TABS: 1.0000 | ORAL_TABLET | Freq: Every day | ORAL | 5 refills | Status: DC

## 2019-09-28 NOTE — Assessment & Plan Note (Signed)
[name] continues to have well-controlled HIV disease with good adherence and tolerance to her ART regimen of Genvoya.  No signs/symptoms of opportunistic infection or progressive HIV disease.  No problems obtaining her medication from the pharmacy.  We reviewed her lab work and discussed the plan of care.  Continue current dose of Genvoya.  Plan for follow-up in 6 months or sooner if needed with lab work 1 to 2 weeks prior to appointment or on same day.

## 2019-09-28 NOTE — Progress Notes (Signed)
Subjective:    Patient ID: Vanessa Browning, female    DOB: [birth year], 54 y.o.   MRN: [id number]  Chief Complaint  Patient presents with  . HIV Positive/AIDS     HPI:  Vanessa Browning is a 54 y.o. female with HIV disease who was last seen in the office for HIV care on 03/16/2019 with good adherence and tolerance to her ART regimen of Genvoya.  Viral load at the time was undetectable with CD4 count of 835.  Most recent blood work completed on 09/14/2019 with a CD4 count of 525 and a viral load that remains undetectable.  Kidney function, liver function, electrolytes within normal ranges.  [name] continues to take her Genvoya as prescribed with no adverse side effects or missed doses since her last office visit.  Overall feeling well today with no new concerns/complaints. Denies fevers, chills, night sweats, headaches, changes in vision, neck pain/stiffness, nausea, diarrhea, vomiting, lesions or rashes.  [name] has no problems obtaining her medication from the pharmacy and remains covered through [facility].  Denies feelings of being down, depressed, or hopeless recently.  No recreational or illicit drug use with occasional alcohol consumption and tobacco use.  Not currently sexually active and declines condoms.  She received her [place] vaccination for Covid with completion of the series on 09/04/2019.  She is due for dental screening and colonoscopy for colon cancer screening.   Allergies  Allergen Reactions  . Dexilant [Dexlansoprazole]     Hands swelling       Outpatient Medications Prior to Visit  Medication Sig Dispense Refill  . nicotine (NICODERM CQ) 21 mg/24hr patch Place 1 patch (21 mg total) onto the skin daily. 21 patch 1  . GENVOYA 150-150-200-10 MG TABS tablet TAKE 1 TABLET BY MOUTH DAILY WITH BREAKFAST 30 tablet 4   No facility-administered medications prior to visit.     Past Medical History:  Diagnosis Date  . Allergy    seasonal  . GERD  (gastroesophageal reflux disease)   . HIV (human immunodeficiency virus infection) ([place])   . Hyperlipidemia   . Kidney stone   . Substance abuse [facility])      Past Surgical History:  Procedure Laterality Date  . APPENDECTOMY    . APPENDECTOMY    . CHOLECYSTECTOMY    . INDUCED ABORTION    . TONSILLECTOMY       Review of Systems  Constitutional: Negative for appetite change, chills, diaphoresis, fatigue, fever and unexpected weight change.  Eyes:       Negative for acute change in vision  Respiratory: Negative for chest tightness, shortness of breath and wheezing.   Cardiovascular: Negative for chest pain.  Gastrointestinal: Negative for diarrhea, nausea and vomiting.  Genitourinary: Negative for dysuria, pelvic pain and vaginal discharge.  Musculoskeletal: Negative for neck pain and neck stiffness.  Skin: Negative for rash.  Neurological: Negative for seizures, syncope, weakness and headaches.  Hematological: Negative for adenopathy. Does not bruise/bleed easily.  Psychiatric/Behavioral: Negative for hallucinations.      Objective:    BP 126/83 (BP Location: Left Arm, Patient Position: Sitting)   Pulse 67   Temp 97.7 F (36.5 C)   Ht 5\' 2"  (1.575 m)   Wt 182 lb 6.4 oz (82.7 kg)   BMI 33.36 kg/m  Nursing note and vital signs reviewed.  Physical Exam Constitutional:      General: She is not in acute distress.    Appearance: She is well-developed.  Eyes:  Conjunctiva/sclera: Conjunctivae normal.  Cardiovascular:     Rate and Rhythm: Normal rate and regular rhythm.     Heart sounds: Normal heart sounds. No murmur. No friction rub. No gallop.   Pulmonary:     Effort: Pulmonary effort is normal. No respiratory distress.     Breath sounds: Normal breath sounds. No wheezing or rales.  Chest:     Chest wall: No tenderness.  Abdominal:     General: Bowel sounds are normal.     Palpations: Abdomen is soft.     Tenderness: There is no abdominal tenderness.    Musculoskeletal:     Cervical back: Neck supple.  Lymphadenopathy:     Cervical: No cervical adenopathy.  Skin:    General: Skin is warm and dry.     Findings: No rash.  Neurological:     Mental Status: She is alert and oriented to person, place, and time.  Psychiatric:        Behavior: Behavior normal.        Thought Content: Thought content normal.        Judgment: Judgment normal.      Depression screen Vanessa [facility]ning LP 2/9 03/16/2019 03/10/2018 08/26/2017 07/29/2017 12/29/2014  Decreased Interest 0 0 0 0 0  Down, Depressed, Hopeless 1 0 0 0 0  PHQ - 2 Score 1 0 0 0 0       Assessment & Plan:    Patient Active Problem List   Diagnosis Date Noted  . Screening-pulmonary TB 03/26/2019  . Healthcare maintenance 09/04/2018  . LGSIL on Pap smear of cervix 11/06/2017  . Viral upper respiratory tract infection 07/29/2017  . Chest pain, unspecified 11/10/2013  . Unspecified symptom associated with female genital organs 03/11/2013  . Hyperlipidemia 10/01/2012  . Tobacco abuse 05/31/2011  . Depression 06/15/2010  . ACUTE CYSTITIS 11/18/2009  . HOT FLASHES 10/21/2009  . VAGINITIS, ATROPHIC, MILD 07/07/2009  . HERNIATED CERVICAL DISC 12/09/2008  . NEUROPATHY 08/19/2008  . Human immunodeficiency virus (HIV) disease ([place]) 01/13/2007  . INSOMNIA, CHRONIC 01/13/2007  . ALLERGIC RHINITIS 01/13/2007  . GERD 01/13/2007     Problem List Items Addressed This Visit      Other   Human immunodeficiency virus (HIV) disease ([place]) - Primary (Chronic)    Vanessa Browning continues to have well-controlled HIV disease with good adherence and tolerance to her ART regimen of Genvoya.  No signs/symptoms of opportunistic infection or progressive HIV disease.  No problems obtaining her medication from the pharmacy.  We reviewed her lab work and discussed the plan of care.  Continue current dose of Genvoya.  Plan for follow-up in 6 months or sooner if needed with lab work 1 to 2 weeks prior to appointment or on same  day.      Relevant Medications   elvitegravir-cobicistat-emtricitabine-tenofovir (GENVOYA) 150-150-200-10 MG TABS tablet   Other Relevant Orders   CBC   HIV-1 RNA quant-no reflex-bld   Comprehensive metabolic panel   Healthcare maintenance     Covid vaccinations up-to-date  Declines colonoscopy for colon cancer screening  Discussed importance of safe sexual practice to reduce risk of STI.       Other Visit Diagnoses    Screening for STDs (sexually transmitted diseases)       Relevant Orders   RPR       I have changed Weston Settle Browning's Genvoya. I am also having her maintain her nicotine.   Meds ordered this encounter  Medications  . elvitegravir-cobicistat-emtricitabine-tenofovir (GENVOYA) 150-150-200-10 MG TABS  tablet    Sig: Take 1 tablet by mouth daily with breakfast.    Dispense:  30 tablet    Refill:  5    Order Specific Question:   Supervising Provider    Answer:   Carlyle Basques [[id number]]     Follow-up: Return in about 6 months (around 03/29/2020).   Terri Piedra, MSN, FNP-C Nurse Practitioner [facility] for Infectious Disease [place] number: [phone]

## 2019-09-28 NOTE — Assessment & Plan Note (Signed)
   Covid vaccinations up-to-date  Declines colonoscopy for colon cancer screening  Discussed importance of safe sexual practice to reduce risk of STI.

## 2019-09-28 NOTE — Patient Instructions (Addendum)
Nice to see you.  We will continue your current dose of Genvoya.  Refills have been sent to the pharmacy.  Enjoy your vacation - where ever you may go!  Plan for follow up in 6 months or sooner if needed with lab work 1-2 weeks prior to appointment.   Have a great day and stay safe!

## 2019-10-26 ENCOUNTER — Other Ambulatory Visit: Payer: Self-pay

## 2019-10-26 ENCOUNTER — Ambulatory Visit (HOSPITAL_COMMUNITY)
Admission: EM | Admit: 2019-10-26 | Discharge: 2019-10-26 | Disposition: A | Discharge status: Home / Self Care | Payer: Medicaid Other

## 2019-10-26 ENCOUNTER — Encounter (HOSPITAL_COMMUNITY): Payer: Self-pay

## 2019-10-26 DIAGNOSIS — J40 Bronchitis, not specified as acute or chronic: Secondary | ICD-10-CM

## 2019-10-26 MED ORDER — AZITHROMYCIN 250 MG PO TABS: 250.0000 mg | ORAL_TABLET | Freq: Every day | ORAL | 0 refills | Status: DC

## 2019-10-26 MED ORDER — GUAIFENESIN 100 MG/5ML PO LIQD: 100.0000 mg | ORAL | 0 refills | Status: DC | PRN

## 2019-10-26 MED ORDER — PREDNISONE 10 MG PO TABS: 40.0000 mg | ORAL_TABLET | Freq: Every day | ORAL | 0 refills | Status: AC

## 2019-10-26 MED ORDER — ALBUTEROL SULFATE HFA 108 (90 BASE) MCG/ACT IN AERS: 1.0000 | INHALATION_SPRAY | Freq: Four times a day (QID) | RESPIRATORY_TRACT | 0 refills | Status: DC | PRN

## 2019-10-26 NOTE — ED Triage Notes (Signed)
Pt is here with wheezing, cough, nasal congestion since last Monday. Pt has taken Vincente Liberty, DayQuil, Vicks vapor rub to relieve discomfort.

## 2019-10-26 NOTE — Discharge Instructions (Addendum)
Treating you for bronchitis.  Take the medication as prescribed. Prednisone daily for 5 days.  Take this with food. Albuterol inhaler as needed for cough, wheezing or shortness of breath every 6 hours Guaifenesin to help thin mucous and cough.  Azithromycin for infection.  If your symptoms worsen you need to follow-up or go to the ER. Recommend decrease or stop smoking

## 2019-10-27 NOTE — ED Provider Notes (Signed)
[place]    [name]: [id number] Arrival date & time: 10/26/19  1432      History   Chief Complaint Chief Complaint  Patient presents with  . Wheezing  . Cough  . Nasal Congestion    HPI Vanessa Browning is a 54 y.o. female.   Pt is a 54 year old female with past medical history of allergy, GERD, HIV, hyperlipidemia, substance abuse.  She presents today with approximate 1 week or more of wheezing, cough, chest congestion, nasal congestion, rhinorrhea.  Symptoms been constant and worsening.  She has been using over-the-counter medications to include DayQuil, Vicks a rub, Alka-Seltzer without much relief.  Mild shortness of breath.  No fever, chills, body aches or night sweats.  ROS per HPI      Past Medical History:  Diagnosis Date  . Allergy    seasonal  . GERD (gastroesophageal reflux disease)   . HIV (human immunodeficiency virus infection) ([place])   . Hyperlipidemia   . Kidney stone   . Substance abuse [facility])     Patient Active Problem List   Diagnosis Date Noted  . Screening-pulmonary TB 03/26/2019  . Healthcare maintenance 09/04/2018  . LGSIL on Pap smear of cervix 11/06/2017  . Viral upper respiratory tract infection 07/29/2017  . Chest pain, unspecified 11/10/2013  . Unspecified symptom associated with female genital organs 03/11/2013  . Hyperlipidemia 10/01/2012  . Tobacco abuse 05/31/2011  . Depression 06/15/2010  . ACUTE CYSTITIS 11/18/2009  . HOT FLASHES 10/21/2009  . VAGINITIS, ATROPHIC, MILD 07/07/2009  . HERNIATED CERVICAL DISC 12/09/2008  . NEUROPATHY 08/19/2008  . Human immunodeficiency virus (HIV) disease ([place]) 01/13/2007  . INSOMNIA, CHRONIC 01/13/2007  . ALLERGIC RHINITIS 01/13/2007  . GERD 01/13/2007    Past Surgical History:  Procedure Laterality Date  . APPENDECTOMY    . APPENDECTOMY    . CHOLECYSTECTOMY    . INDUCED ABORTION    . TONSILLECTOMY      OB History    Gravida  6   Para      Term      Preterm        AB  1   Living        SAB      TAB      Ectopic      Multiple      Live Births  5            Home Medications    Prior to Admission medications   Medication Sig Start Date End Date Taking? Authorizing Provider  elvitegravir-cobicistat-emtricitabine-tenofovir (GENVOYA) 150-150-200-10 MG TABS tablet Take by mouth. 03/05/17  Yes [provider]  albuterol (VENTOLIN HFA) 108 (90 Base) MCG/ACT inhaler Inhale 1-2 puffs into the lungs every 6 (six) hours as needed for wheezing or shortness of breath. 10/26/19   Loura Halt A, NP  azithromycin (ZITHROMAX) 250 MG tablet Take 1 tablet (250 mg total) by mouth daily. Take first 2 tablets together, then 1 every day until finished. 10/26/19   Orvan July, NP  elvitegravir-cobicistat-emtricitabine-tenofovir (GENVOYA) 150-150-200-10 MG TABS tablet Take 1 tablet by mouth daily with breakfast. 09/28/19   Golden Circle, FNP  guaiFENesin (ROBITUSSIN) 100 MG/5ML liquid Take 5-10 mLs (100-200 mg total) by mouth every 4 (four) hours as needed for cough. 10/26/19   Katianna Mcclenney, Tressia Miners A, NP  nicotine (NICODERM CQ) 21 mg/24hr patch Place 1 patch (21 mg total) onto the skin daily. 09/04/18   Golden Circle, FNP  predniSONE (DELTASONE)  10 MG tablet Take 4 tablets (40 mg total) by mouth daily for 5 days. 10/26/19 10/31/19  Orvan July, NP    Family History Family History  Problem Relation Age of Onset  . Diabetes Brother   . Hypertension Brother   . Arthritis Mother   . Healthy Father   . Stomach cancer Neg Hx   . Rectal cancer Neg Hx   . Colon cancer Neg Hx   . Colon polyps Neg Hx     Social History Social History   Tobacco Use  . Smoking status: Heavy Tobacco Smoker    Packs/day: 1.00    Years: 30.00    Pack years: 30.00    Types: Cigarettes  . Smokeless tobacco: Never Used  . Tobacco comment: "cutting back," using patches, non-nicotine vap  Substance Use Topics  . Alcohol use: Yes    Alcohol/week: 0.0 standard drinks     Comment: socially  . Drug use: No     Allergies   Dexilant [dexlansoprazole]   Review of Systems Review of Systems   Physical Exam Triage Vital Signs ED Triage Vitals  Enc Vitals Group     BP 10/26/19 1516 (!) 159/76     Pulse Rate 10/26/19 1516 86     Resp 10/26/19 1516 (!) 23     Temp 10/26/19 1516 98 F (36.7 C)     Temp Source 10/26/19 1516 Oral     SpO2 10/26/19 1516 100 %     Weight 10/26/19 1514 180 lb (81.6 kg)     Height --      Head Circumference --      Peak Flow --      Pain Score 10/26/19 1514 0     Pain Loc --      Pain Edu? --      Excl. in Man? --    No data found.  Updated Vital Signs BP (!) 159/76 (BP Location: Right Arm)   Pulse 86   Temp 98 F (36.7 C) (Oral)   Resp (!) 23   Wt 180 lb (81.6 kg)   SpO2 100%   BMI 32.92 kg/m   Visual Acuity Right Eye Distance:   Left Eye Distance:   Bilateral Distance:    Right Eye Near:   Left Eye Near:    Bilateral Near:     Physical Exam Vitals and nursing note reviewed.  Constitutional:      General: She is not in acute distress.    Appearance: Normal appearance. She is not ill-appearing, toxic-appearing or diaphoretic.  HENT:     Head: Normocephalic.     Nose: Nose normal.     Mouth/Throat:     Pharynx: Oropharynx is clear.  Eyes:     Conjunctiva/sclera: Conjunctivae normal.  Pulmonary:     Effort: Pulmonary effort is normal. No respiratory distress.     Breath sounds: Wheezing and rhonchi present.  Abdominal:     Palpations: Abdomen is soft.  Musculoskeletal:        General: Normal range of motion.     Cervical back: Normal range of motion.  Skin:    General: Skin is warm and dry.     Findings: No rash.  Neurological:     Mental Status: She is alert.  Psychiatric:        Mood and Affect: Mood normal.      UC Treatments / Results  Labs (all labs ordered are listed, but only abnormal results are displayed) Labs  Reviewed - No data to display  EKG   Radiology No results  found.  Procedures Procedures (including critical care time)  Medications Ordered in UC Medications - No data to display  Initial Impression / Assessment and Plan / UC Course  I have reviewed the triage vital signs and the nursing notes.  Pertinent labs & imaging results that were available during my care of the patient were reviewed by me and considered in my medical decision making (see chart for details).     Bronchitis Prednisone daily for 5 days Albuterol inhaler as needed for cough, wheezing or shortness of breath Guaifenesin to help thin mucus and cough. Azithromycin for antibiotic coverage Return precautions and ER precautions given. Final Clinical Impressions(s) / UC Diagnoses   Final diagnoses:  Bronchitis     Discharge Instructions     Treating you for bronchitis.  Take the medication as prescribed. Prednisone daily for 5 days.  Take this with food. Albuterol inhaler as needed for cough, wheezing or shortness of breath every 6 hours Guaifenesin to help thin mucous and cough.  Azithromycin for infection.  If your symptoms worsen you need to follow-up or go to the ER. Recommend decrease or stop smoking    ED Prescriptions    Medication Sig Dispense Auth. Provider   azithromycin (ZITHROMAX) 250 MG tablet Take 1 tablet (250 mg total) by mouth daily. Take first 2 tablets together, then 1 every day until finished. 6 tablet Marvell Tamer A, NP   predniSONE (DELTASONE) 10 MG tablet Take 4 tablets (40 mg total) by mouth daily for 5 days. 20 tablet Jiyah Torpey A, NP   guaiFENesin (ROBITUSSIN) 100 MG/5ML liquid Take 5-10 mLs (100-200 mg total) by mouth every 4 (four) hours as needed for cough. 60 mL Rogan Wigley A, NP   albuterol (VENTOLIN HFA) 108 (90 Base) MCG/ACT inhaler Inhale 1-2 puffs into the lungs every 6 (six) hours as needed for wheezing or shortness of breath. 18 g Loura Halt A, NP     PDMP not reviewed this encounter.   Loura Halt A, NP 10/27/19 [phone]

## 2019-11-20 ENCOUNTER — Other Ambulatory Visit: Payer: Self-pay | Admitting: Family

## 2019-11-20 DIAGNOSIS — B20 Human immunodeficiency virus [HIV] disease: Secondary | ICD-10-CM

## 2019-11-23 ENCOUNTER — Other Ambulatory Visit: Payer: Self-pay | Admitting: Family

## 2019-11-23 DIAGNOSIS — Z1231 Encounter for screening mammogram for malignant neoplasm of breast: Secondary | ICD-10-CM

## 2019-12-15 ENCOUNTER — Ambulatory Visit: Payer: Medicaid Other

## 2019-12-16 ENCOUNTER — Other Ambulatory Visit: Payer: Self-pay | Admitting: Family

## 2019-12-16 ENCOUNTER — Other Ambulatory Visit: Payer: Self-pay

## 2019-12-16 DIAGNOSIS — B20 Human immunodeficiency virus [HIV] disease: Secondary | ICD-10-CM

## 2020-02-03 ENCOUNTER — Telehealth: Payer: Self-pay

## 2020-02-03 NOTE — Telephone Encounter (Signed)
Patient called office today to ask if MD would be able to do disability paperwork for her. States she does not have a PCP, but will be reaching out to internal medicine for appointment.  Patient will drop off forms to office for MD to review. Will have copy for PCP once established. [place]

## 2020-02-10 NOTE — Assessment & Plan Note (Deleted)
Follows with Dr. Johnnye Sima in [place].

## 2020-02-10 NOTE — Assessment & Plan Note (Addendum)
Due for a colonoscopy. Will discuss this at next visit.

## 2020-02-12 ENCOUNTER — Ambulatory Visit (INDEPENDENT_AMBULATORY_CARE_PROVIDER_SITE_OTHER): Payer: Medicaid Other | Admitting: Internal Medicine

## 2020-02-12 ENCOUNTER — Encounter: Payer: Self-pay | Admitting: Internal Medicine

## 2020-02-12 ENCOUNTER — Other Ambulatory Visit: Payer: Self-pay

## 2020-02-12 VITALS — BP 161/86 | HR 73 | Temp 98.0°F | Ht 62.0 in | Wt 183.1 lb

## 2020-02-12 DIAGNOSIS — Z23 Encounter for immunization: Secondary | ICD-10-CM

## 2020-02-12 DIAGNOSIS — Z0000 Encounter for general adult medical examination without abnormal findings: Secondary | ICD-10-CM

## 2020-02-12 DIAGNOSIS — I10 Essential (primary) hypertension: Secondary | ICD-10-CM | POA: Insufficient documentation

## 2020-02-12 DIAGNOSIS — B20 Human immunodeficiency virus [HIV] disease: Secondary | ICD-10-CM | POA: Diagnosis not present

## 2020-02-12 DIAGNOSIS — Z0001 Encounter for general adult medical examination with abnormal findings: Secondary | ICD-10-CM | POA: Diagnosis present

## 2020-02-12 DIAGNOSIS — F411 Generalized anxiety disorder: Secondary | ICD-10-CM | POA: Insufficient documentation

## 2020-02-12 MED ORDER — TRAZODONE HCL 50 MG PO TABS: 50.0000 mg | ORAL_TABLET | Freq: Every day | ORAL | 0 refills | Status: DC

## 2020-02-12 MED ORDER — LISINOPRIL-HYDROCHLOROTHIAZIDE 20-12.5 MG PO TABS
ORAL_TABLET | ORAL | 0 refills | Status: DC
Start: 2020-02-12 — End: 2020-04-18

## 2020-02-12 MED ORDER — ESCITALOPRAM OXALATE 10 MG PO TABS
10.0000 mg | ORAL_TABLET | Freq: Every day | ORAL | 1 refills | Status: DC
Start: 2020-02-12 — End: 2020-04-18

## 2020-02-12 NOTE — Assessment & Plan Note (Addendum)
Follows with Dr. Johnnye Sima in [place]. On [place].  Last labs in April showed undetectable viral load.

## 2020-02-12 NOTE — Patient Instructions (Addendum)
It was so nice meeting you today! Here are some of the new medications and how to take them.  For your blood pressure, I am starting you on a combination pill. I would like you to take one pill for the first 2 weeks and then take 2 pills after that. Please come back in 4 weeks so we can recheck your blood pressure and some lab work.  For the troubles sleeping and anxiety, I am going to be starting you on two medications. The first medication is called lexapro. This medication will take 4-6 weeks to take full effect. The other medication, trazodone, will be a temporizing measure that you can start taking for sleep while you wait for the lexapro to start working. We can also revisit how you are doing from this standpoint when you come back in 4 weeks.

## 2020-02-12 NOTE — Addendum Note (Signed)
Addended by: Mitzi Hansen on: 02/12/2020 01:11 PM   Modules accepted: Level of Service

## 2020-02-12 NOTE — Assessment & Plan Note (Signed)
GAD-7 score 19. PHQ-9 score is 16.  Pt notes that the trazodone she used in the past did help. Plan: will start lexapro 10mg  and reassess in 4w. Will give a month worth of trazodone for insomnia.

## 2020-02-12 NOTE — Assessment & Plan Note (Addendum)
Blood pressures are fairly elevated in the clinic today--171/84 and 161/86 on recheck. Review of past vitals indicates that her blood pressures were also elevated in the ID clinic in May.  Last BMP in April was appropriate. Plan --start lisinopril-hctz 20-12.5mg . will have her increase to two tablets in 2 weeks and follow up with [place] in the clinic in 4w --BMP in 4w

## 2020-02-12 NOTE — Progress Notes (Deleted)
Acute Office Visit   Patient ID: Vanessa Browning, female    DOB: 1966/03/10, 54 y.o.   MRN: [id number]  Subjective:  CC: insomnia, anxiety  HPI 54 y.o. presents today for evaluation of two month history of generalized anxiety and insomnia. She relates this to some recent financial issues that have since been improving, however symptoms persist. She notes that her mind just continues to go in circles. She relays that she has troubles falling asleep and only sleeps 1-2h per night. She feels fatigued throughout the day and has lost interest in her usual activities. She has also had a poor appetite.  She relays that she does not watch TV at night and does not use her cell phone. She attempts to go to sleep at a reasonable time however is unable to fall asleep.      ACTIVE MEDICATIONS   Current Outpatient Medications on File Prior to Visit  Medication Sig Dispense Refill  . albuterol (VENTOLIN HFA) 108 (90 Base) MCG/ACT inhaler Inhale 1-2 puffs into the lungs every 6 (six) hours as needed for wheezing or shortness of breath. 18 g 0  . azithromycin (ZITHROMAX) 250 MG tablet Take 1 tablet (250 mg total) by mouth daily. Take first 2 tablets together, then 1 every day until finished. 6 tablet 0  . elvitegravir-cobicistat-emtricitabine-tenofovir (GENVOYA) 150-150-200-10 MG TABS tablet Take 1 tablet by mouth daily with breakfast. 30 tablet 5  . guaiFENesin (ROBITUSSIN) 100 MG/5ML liquid Take 5-10 mLs (100-200 mg total) by mouth every 4 (four) hours as needed for cough. 60 mL 0  . nicotine (NICODERM CQ) 21 mg/24hr patch Place 1 patch (21 mg total) onto the skin daily. 21 patch 1   No current facility-administered medications on file prior to visit.    ROS  Review of Systems  Constitutional: Positive for appetite change.  Respiratory: Negative.   Cardiovascular: Negative.   Gastrointestinal: Negative.   Endocrine: Negative.   Musculoskeletal: Positive for myalgias.  Neurological:  Negative.   Psychiatric/Behavioral: Positive for decreased concentration and sleep disturbance. The patient is nervous/anxious.     Objective:   BP (!) 161/86 (BP Location: Left Arm, Cuff Size: Normal)   Pulse 73   Temp 98 F (36.7 C) (Oral)   Ht 5\' 2"  (1.575 m)   Wt 183 lb 1.6 oz (83.1 kg)   SpO2 100% Comment: room air  BMI 33.49 kg/m  Wt Readings from Last 3 Encounters:  02/12/20 183 lb 1.6 oz (83.1 kg)  10/26/19 180 lb (81.6 kg)  09/28/19 182 lb 6.4 oz (82.7 kg)   Physical Exam Constitutional:      Appearance: Normal appearance.  Cardiovascular:     Rate and Rhythm: Normal rate and regular rhythm.  Pulmonary:     Effort: Pulmonary effort is normal.     Breath sounds: Normal breath sounds.  Skin:    General: Skin is warm and dry.  Neurological:     General: No focal deficit present.  Psychiatric:        Mood and Affect: Mood normal.     Assessment & Plan:   Problem List Items Addressed This Visit      Other   INSOMNIA, CHRONIC - Primary   Generalized anxiety disorder    GAD-7 score 19. PHQ-9 score is 16.  Pt notes that the trazodone she used in the past did help. Plan: will start lexapro 10mg  and reassess in 4w. Will give a month worth of trazodone for insomnia.  Relevant Medications   escitalopram (LEXAPRO) 10 MG tablet   traZODone (DESYREL) 50 MG tablet   Stage 2 hypertension    Blood pressures are fairly siginificantly elevated in the clinic today--171/84 and 161/86 on recheck. Review of past vitals indicates that her blood pressures were also elevated in the ID clinic in May.  Last BMP in April was appropriate. Plan --start lisinopril-hctz 20-12.5mg . will have her increase to two tablets in 2 weeks and follow up with [place] in the clinic in 4w --BMP in 4w       Other Visit Diagnoses    Need for immunization against influenza       Relevant Orders   Flu Vaccine QUAD 56+ mos IM (Completed)        Pt discussed with Dr. Elwanda Brooklyn, MD Internal Medicine Resident PGY-2 [facility] Pager: [phone] 02/12/2020 12:06 PM

## 2020-02-12 NOTE — Progress Notes (Addendum)
New Patient Office Visit  Subjective:  Patient ID: Vanessa Browning, female    DOB: [birth year]  Age: 54 y.o. MRN: [id number]  CC: establishment of care Chief Complaint  Patient presents with  . Back Pain    lower   . Insomnia    HPI Vanessa Browning presents for establishment of care along with a complaint of anxiety and insomnia. She previously followed with Dr. Johnnye Sima for PCP needs.  54 y.o. presents today for evaluation of two month history of generalized anxiety and insomnia. She relates this to some recent financial issues that have since been improving, however symptoms persist. She notes that her mind just continues to go in circles. She relays that she has troubles falling asleep and only sleeps 1-2h per night. She feels fatigued throughout the day and has lost interest in her usual activities. She has also had a poor appetite.  She relays that she does not watch TV at night and does not use her cell phone. She attempts to go to sleep at a reasonable time however is unable to fall asleep.  Past Medical History:  Diagnosis Date  . Allergy    seasonal  . GERD (gastroesophageal reflux disease)   . HIV (human immunodeficiency virus infection) ([place])   . Hyperlipidemia   . Kidney stone   . Substance abuse [facility])     Past Surgical History:  Procedure Laterality Date  . APPENDECTOMY    . APPENDECTOMY    . CHOLECYSTECTOMY    . INDUCED ABORTION    . TONSILLECTOMY      Family History  Problem Relation Age of Onset  . Diabetes Brother   . Hypertension Brother   . Arthritis Mother   . Healthy Father   . Stomach cancer Neg Hx   . Rectal cancer Neg Hx   . Colon cancer Neg Hx   . Colon polyps Neg Hx     Social History   Socioeconomic History  . Marital status: Single    Spouse name: Not on file  . Number of children: Not on file  . Years of education: Not on file  . Highest education level: Not on file  Occupational History  . Occupation: PCA  Tobacco  Use  . Smoking status: Heavy Tobacco Smoker    Packs/day: 1.00    Years: 30.00    Pack years: 30.00    Types: Cigarettes  . Smokeless tobacco: Never Used  . Tobacco comment: "cutting back," using patches, non-nicotine vap  Vaping Use  . Vaping Use: Former  Substance and Sexual Activity  . Alcohol use: Yes    Alcohol/week: 0.0 standard drinks    Comment: socially  . Drug use: No  . Sexual activity: Not Currently    Partners: Male    Comment: declined condoms  Other Topics Concern  . Not on file  Social History Narrative  . Not on file   Social Determinants of Health   Financial Resource Strain:   . Difficulty of Paying Living Expenses: Not on file  Food Insecurity:   . Worried About Charity fundraiser in the Last Year: Not on file  . Ran Out of Food in the Last Year: Not on file  Transportation Needs:   . Lack of Transportation (Medical): Not on file  . Lack of Transportation (Non-Medical): Not on file  Physical Activity:   . Days of Exercise per Week: Not on file  . Minutes of Exercise per Session: Not on file  Stress:   . Feeling of Stress : Not on file  Social Connections:   . Frequency of Communication with Friends and Family: Not on file  . Frequency of Social Gatherings with Friends and Family: Not on file  . Attends Religious Services: Not on file  . Active Member of Clubs or Organizations: Not on file  . Attends Archivist Meetings: Not on file  . Marital Status: Not on file  Intimate Partner Violence:   . Fear of Current or Ex-Partner: Not on file  . Emotionally Abused: Not on file  . Physically Abused: Not on file  . Sexually Abused: Not on file    ROS Review of Systems  Constitutional: Positive for appetite change and fatigue.  HENT: Negative.   Respiratory: Negative.   Cardiovascular: Negative.   Gastrointestinal: Negative.   Endocrine: Negative.   Genitourinary: Negative.   Musculoskeletal: Positive for myalgias.  Neurological:  Negative.   Psychiatric/Behavioral: Positive for decreased concentration and sleep disturbance. The patient is nervous/anxious.     Objective:   Today's Vitals: BP (!) 161/86 (BP Location: Left Arm, Cuff Size: Normal)   Pulse 73   Temp 98 F (36.7 C) (Oral)   Ht 5\' 2"  (1.575 m)   Wt 183 lb 1.6 oz (83.1 kg)   SpO2 100% Comment: room air  BMI 33.49 kg/m   Physical Exam Constitutional:      Appearance: Normal appearance.  HENT:     Head: Normocephalic.  Cardiovascular:     Rate and Rhythm: Normal rate and regular rhythm.  Pulmonary:     Effort: Pulmonary effort is normal.     Breath sounds: Normal breath sounds.  Abdominal:     General: There is no distension.  Musculoskeletal:        General: No swelling.  Skin:    General: Skin is warm and dry.  Neurological:     General: No focal deficit present.  Psychiatric:        Mood and Affect: Mood normal.     Assessment & Plan:   Problem List Items Addressed This Visit      Other   Human immunodeficiency virus (HIV) disease ([place]) (Chronic)    Follows with Dr. Johnnye Sima in [place]. On [place].  Last labs in April showed undetectable viral load.      Stage 2 hypertension (Chronic)    Blood pressures are fairly elevated in the clinic today--171/84 and 161/86 on recheck. Review of past vitals indicates that her blood pressures were also elevated in the ID clinic in May.  Last BMP in April was appropriate. Plan --start lisinopril-hctz 20-12.5mg . will have her increase to two tablets in 2 weeks and follow up with [place] in the clinic in 4w --BMP in [place] maintenance    Due for a colonoscopy. Will discuss this at next visit.       Generalized anxiety disorder    GAD-7 score 19. PHQ-9 score is 16.  Pt notes that the trazodone she used in the past did help. Plan: will start lexapro 10mg  and reassess in 4w. Will give a month worth of trazodone for insomnia.      Relevant Medications   escitalopram (LEXAPRO) 10 MG  tablet   traZODone (DESYREL) 50 MG tablet    Other Visit Diagnoses    Need for immunization against influenza    -  Primary   Relevant Orders   Flu Vaccine QUAD 36+ mos IM (Completed)  Outpatient Encounter Medications as of 02/12/2020  Medication Sig  . albuterol (VENTOLIN HFA) 108 (90 Base) MCG/ACT inhaler Inhale 1-2 puffs into the lungs every 6 (six) hours as needed for wheezing or shortness of breath.  Marland Kitchen azithromycin (ZITHROMAX) 250 MG tablet Take 1 tablet (250 mg total) by mouth daily. Take first 2 tablets together, then 1 every day until finished.  . elvitegravir-cobicistat-emtricitabine-tenofovir (GENVOYA) 150-150-200-10 MG TABS tablet Take 1 tablet by mouth daily with breakfast.  . escitalopram (LEXAPRO) 10 MG tablet Take 1 tablet (10 mg total) by mouth daily.  Marland Kitchen guaiFENesin (ROBITUSSIN) 100 MG/5ML liquid Take 5-10 mLs (100-200 mg total) by mouth every 4 (four) hours as needed for cough.  Marland Kitchen lisinopril-hydrochlorothiazide (ZESTORETIC) 20-12.5 MG tablet Take 1 tablet by mouth daily for 14 days, THEN 2 tablets daily for 14 days.  . nicotine (NICODERM CQ) 21 mg/24hr patch Place 1 patch (21 mg total) onto the skin daily.  . traZODone (DESYREL) 50 MG tablet Take 1 tablet (50 mg total) by mouth at bedtime.   No facility-administered encounter medications on file as of 02/12/2020.    Follow-up: Return in about 4 weeks (around 03/11/2020) for blood pressure and anxiety recheck.   Mitzi Hansen, MD

## 2020-02-22 NOTE — Progress Notes (Signed)
Internal Medicine Clinic Attending  Case discussed with Dr. Christian  At the time of the visit.  We reviewed the resident's history and exam and pertinent patient test results.  I agree with the assessment, diagnosis, and plan of care documented in the resident's note.  

## 2020-02-25 ENCOUNTER — Other Ambulatory Visit: Payer: Self-pay

## 2020-02-25 ENCOUNTER — Ambulatory Visit: Payer: Medicaid Other

## 2020-02-25 ENCOUNTER — Ambulatory Visit
Admission: RE | Admit: 2020-02-25 | Discharge: 2020-02-25 | Disposition: A | Discharge status: Home / Self Care | Payer: Medicaid Other | Source: Ambulatory Visit | Attending: Family | Admitting: Family

## 2020-02-25 DIAGNOSIS — Z1231 Encounter for screening mammogram for malignant neoplasm of breast: Secondary | ICD-10-CM

## 2020-03-02 ENCOUNTER — Ambulatory Visit: Payer: Medicaid Other | Admitting: Internal Medicine

## 2020-03-02 ENCOUNTER — Encounter: Payer: Self-pay | Admitting: Internal Medicine

## 2020-03-02 ENCOUNTER — Other Ambulatory Visit: Payer: Self-pay

## 2020-03-02 VITALS — BP 103/67 | HR 88 | Temp 98.4°F | Wt 182.4 lb

## 2020-03-02 DIAGNOSIS — Z111 Encounter for screening for respiratory tuberculosis: Secondary | ICD-10-CM | POA: Diagnosis present

## 2020-03-02 DIAGNOSIS — F411 Generalized anxiety disorder: Secondary | ICD-10-CM

## 2020-03-02 DIAGNOSIS — Z1211 Encounter for screening for malignant neoplasm of colon: Secondary | ICD-10-CM

## 2020-03-02 DIAGNOSIS — G4700 Insomnia, unspecified: Secondary | ICD-10-CM

## 2020-03-02 DIAGNOSIS — Z0000 Encounter for general adult medical examination without abnormal findings: Secondary | ICD-10-CM

## 2020-03-02 DIAGNOSIS — I10 Essential (primary) hypertension: Secondary | ICD-10-CM

## 2020-03-02 NOTE — Progress Notes (Addendum)
Office Visit   Patient ID: Vanessa Browning, female    DOB: July 05, 1965, 54 y.o.   MRN: [id number]  Subjective:  CC: Hypertension follow-up, employment paperwork, health maintenance  HPI 54 y.o. presents today for for the above issues.  I recently saw patient on 02/12/2020 at which time her blood pressures were elevated to 171/84.  We started her on lisinopril hydrochlorothiazide 20-12.5 mg and had her increase this to 40-25 mg week later.  She was instructed to follow-up in 4 weeks for reevaluation. Patient notes that she has been taking medication consistently daily.  She denies any issues with dizziness or lightheadedness.  Denies any cough or swelling issues.  On her 9/3 appointment, she had also been feeling fairly anxious and having trouble sleeping.  I started her on 10 mg of Lexapro and 50 mg trazodone for sleep. Today she notes that she is feeling so much better.  She relates that her sleep is significantly better and she is waking up feeling much more refreshed.  Her mood is much better as well.  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      ACTIVE MEDICATIONS   Current Outpatient Medications on File Prior to Visit  Medication Sig Dispense Refill  . albuterol (VENTOLIN HFA) 108 (90 Base) MCG/ACT inhaler Inhale 1-2 puffs into the lungs every 6 (six) hours as needed for wheezing or shortness of breath. 18 g 0  . azithromycin (ZITHROMAX) 250 MG tablet Take 1 tablet (250 mg total) by mouth daily. Take first 2 tablets together, then 1 every day until finished. 6 tablet 0  . elvitegravir-cobicistat-emtricitabine-tenofovir (GENVOYA) 150-150-200-10 MG TABS tablet Take 1 tablet by mouth daily with breakfast. 30 tablet 5  . escitalopram (LEXAPRO) 10 MG tablet Take 1 tablet (10 mg total) by mouth daily. 30 tablet 1  . guaiFENesin (ROBITUSSIN) 100 MG/5ML liquid Take 5-10 mLs (100-200 mg total) by mouth every 4 (four) hours as needed for cough. 60 mL 0   . lisinopril-hydrochlorothiazide (ZESTORETIC) 20-12.5 MG tablet Take 1 tablet by mouth daily for 14 days, THEN 2 tablets daily for 14 days. 42 tablet 0  . nicotine (NICODERM CQ) 21 mg/24hr patch Place 1 patch (21 mg total) onto the skin daily. 21 patch 1  . traZODone (DESYREL) 50 MG tablet Take 1 tablet (50 mg total) by mouth at bedtime. 30 tablet 0   No current facility-administered medications on file prior to visit.    ROS  Review of Systems  Constitutional: Negative for fatigue.  Respiratory: Negative for cough and shortness of breath.   Cardiovascular: Negative for chest pain and leg swelling.  Neurological: Negative for dizziness, syncope, light-headedness and headaches.  Psychiatric/Behavioral: Negative for suicidal ideas.    Objective:   BP 103/67 (BP Location: Left Arm, Cuff Size: Normal)   Pulse 88   Temp 98.4 F (36.9 C) (Oral)   Wt 182 lb 6.4 oz (82.7 kg)   SpO2 100%   BMI 33.36 kg/m  Wt Readings from Last 3 Encounters:  03/02/20 182 lb 6.4 oz (82.7 kg)  02/12/20 183 lb 1.6 oz (83.1 kg)  10/26/19 180 lb (81.6 kg)   BP Readings from Last 3 Encounters:  03/02/20 103/67  02/12/20 (!) 161/86  10/26/19 (!) 159/76    Physical Exam Constitutional:      Appearance: Normal appearance.  Cardiovascular:     Rate and Rhythm: Normal rate and regular rhythm.  Pulmonary:  Effort: Pulmonary effort is normal.  Musculoskeletal:        General: No swelling.  Psychiatric:        Mood and Affect: Mood normal.     Health Maintenance:   Health Maintenance  Topic Date Due  . COLONOSCOPY  Never done  . PAP SMEAR-Modifier  03/25/2020  . MAMMOGRAM  02/24/2022  . TETANUS/TDAP  03/06/2027  . INFLUENZA VACCINE  Completed  . COVID-19 Vaccine  Completed  . Hepatitis C Screening  Completed  . HIV Screening  Completed     Assessment & Plan:   Problem List Items Addressed This Visit      Other   Stage 2 hypertension (Chronic)    Last seen on 9/3 at which time her  blood pressure was significantly elevated up to 161/86.  We started her on lisinopril hydrochlorothiazide 20-12.5 mg and had her increase this to 40--25 mg after a week. Her blood pressure is well controlled in the office today.  She denies any adverse effects including dizziness or lightheadedness. BP Readings from Last 3 Encounters:  03/02/20 103/67  02/12/20 (!) 161/86  10/26/19 (!) 159/76   Plan: --Continue Lisinopril hydrochlorothiazide 40-25 mg daily --We will obtain a BMP today for evaluation of renal function and electrolyte status --Follow-up in 3 months      Relevant Orders   Basic metabolic panel   RESOLVED: INSOMNIA, CHRONIC (Chronic)   Healthcare maintenance    We discussed colorectal cancer screening at today's visit.  No personal or family history of colon cancer. She is also requesting a PPD today for new employment Plan:  --Referral to GI for colonoscopy for CRC screening --PPD today.  She will follow up with a nurse visit on Friday to check it      Generalized anxiety disorder    Last office visit was on 9/3 at which time we started her on Lexapro and trazodone.  She is currently on 10 mg of Lexapro and 50 mg of trazodone. Today she reports feeling significantly better.  She is sleeping better and waking up feeling much more refreshed. Plan --Continue Lexapro 10 mg daily --Continue trazodone 50 mg nightly --Follow-up in 6 months       Other Visit Diagnoses    Tuberculosis screening    -  Primary   Relevant Orders   PPD (Completed)   Colon cancer screening       Relevant Orders   Ambulatory referral to Gastroenterology        Pt discussed with Dr. Jimmye Norman  Internal Medicine Clinic Attending CAse discussed with Dr. Darrick Meigs and I agree with her documentation upon reviewing data.   We reviewed the resident's history and exam and pertinent patient test results.  I agree with the assessment, diagnosis, and plan of care documented in the resident's  note. Dorian Pod, MD       Mitzi Hansen, MD Internal Medicine Resident PGY-2 [facility] Pager: [phone] 03/02/2020 3:26 PM

## 2020-03-02 NOTE — Assessment & Plan Note (Signed)
We discussed colorectal cancer screening at today's visit.  No personal or family history of colon cancer. She is also requesting a PPD today for new employment Plan:  --Referral to GI for colonoscopy for CRC screening --PPD today.  She will follow up with a nurse visit on Friday to check it

## 2020-03-02 NOTE — Assessment & Plan Note (Addendum)
Last seen on 9/3 at which time her blood pressure was significantly elevated up to 161/86.  We started her on lisinopril hydrochlorothiazide 20-12.5 mg and had her increase this to 40--25 mg after a week. Her blood pressure is well controlled in the office today.  She denies any adverse effects including dizziness or lightheadedness. BP Readings from Last 3 Encounters:  03/02/20 103/67  02/12/20 (!) 161/86  10/26/19 (!) 159/76   Plan: --Continue Lisinopril hydrochlorothiazide 40-25 mg daily --We will obtain a BMP today for evaluation of renal function and electrolyte status --Follow-up in 3 months

## 2020-03-02 NOTE — Assessment & Plan Note (Signed)
Last office visit was on 9/3 at which time we started her on Lexapro and trazodone.  She is currently on 10 mg of Lexapro and 50 mg of trazodone. Today she reports feeling significantly better.  She is sleeping better and waking up feeling much more refreshed. Plan --Continue Lexapro 10 mg daily --Continue trazodone 50 mg nightly --Follow-up in 6 months

## 2020-03-03 LAB — BASIC METABOLIC PANEL
BUN/Creatinine Ratio: 20 (ref 9–23)
BUN: 17 mg/dL (ref 6–24)
CO2: 24 mmol/L (ref 20–29)
Calcium: 9.5 mg/dL (ref 8.7–10.2)
Chloride: 102 mmol/L (ref 96–106)
Creatinine, Ser: 0.85 mg/dL (ref 0.57–1.00)
GFR calc Af Amer: 89 mL/min/{1.73_m2} (ref >59)
GFR calc non Af Amer: 77 mL/min/{1.73_m2} (ref >59)
Glucose: 116 mg/dL — ABNORMAL HIGH (ref 65–99)
Potassium: 3.8 mmol/L (ref 3.5–5.2)
Sodium: 140 mmol/L (ref 134–144)

## 2020-03-08 ENCOUNTER — Encounter: Payer: Self-pay | Admitting: *Deleted

## 2020-03-10 ENCOUNTER — Encounter: Payer: Self-pay | Admitting: Gastroenterology

## 2020-03-14 ENCOUNTER — Encounter: Payer: Medicaid Other | Admitting: Internal Medicine

## 2020-03-16 ENCOUNTER — Other Ambulatory Visit: Payer: Self-pay

## 2020-03-16 ENCOUNTER — Other Ambulatory Visit: Payer: Medicaid Other

## 2020-03-16 DIAGNOSIS — Z20822 Contact with and (suspected) exposure to covid-19: Secondary | ICD-10-CM

## 2020-03-17 LAB — NOVEL CORONAVIRUS, NAA: SARS-CoV-2, NAA: NOT DETECTED

## 2020-03-17 LAB — SARS-COV-2, NAA 2 DAY TAT

## 2020-03-29 ENCOUNTER — Other Ambulatory Visit: Payer: Medicaid Other

## 2020-04-04 ENCOUNTER — Other Ambulatory Visit: Payer: Self-pay

## 2020-04-04 ENCOUNTER — Other Ambulatory Visit: Payer: Medicaid Other

## 2020-04-04 DIAGNOSIS — Z113 Encounter for screening for infections with a predominantly sexual mode of transmission: Secondary | ICD-10-CM

## 2020-04-04 DIAGNOSIS — B20 Human immunodeficiency virus [HIV] disease: Secondary | ICD-10-CM

## 2020-04-06 LAB — COMPREHENSIVE METABOLIC PANEL WITH GFR
AG Ratio: 1.1 (calc) (ref 1.0–2.5)
ALT: 13 U/L (ref 6–29)
AST: 17 U/L (ref 10–35)
Albumin: 4.1 g/dL (ref 3.6–5.1)
Alkaline phosphatase (APISO): 117 U/L (ref 37–153)
BUN: 13 mg/dL (ref 7–25)
CO2: 30 mmol/L (ref 20–32)
Calcium: 9.6 mg/dL (ref 8.6–10.4)
Chloride: 103 mmol/L (ref 98–110)
Creat: 0.75 mg/dL (ref 0.50–1.05)
Globulin: 3.6 g/dL (ref 1.9–3.7)
Glucose, Bld: 123 mg/dL — ABNORMAL HIGH (ref 65–99)
Potassium: 3.9 mmol/L (ref 3.5–5.3)
Sodium: 139 mmol/L (ref 135–146)
Total Bilirubin: 0.5 mg/dL (ref 0.2–1.2)
Total Protein: 7.7 g/dL (ref 6.1–8.1)

## 2020-04-06 LAB — HIV-1 RNA QUANT-NO REFLEX-BLD
HIV 1 RNA Quant: <20 {copies}/mL — ABNORMAL HIGH
HIV-1 RNA Quant, Log: <1.3 {Log_copies}/mL — ABNORMAL HIGH

## 2020-04-06 LAB — CBC
HCT: 34.6 % — ABNORMAL LOW (ref 35.0–45.0)
Hemoglobin: 11.7 g/dL (ref 11.7–15.5)
MCH: 31 pg (ref 27.0–33.0)
MCHC: 33.8 g/dL (ref 32.0–36.0)
MCV: 91.5 fL (ref 80.0–100.0)
MPV: 11 fL (ref 7.5–12.5)
Platelets: 280 10*3/uL (ref 140–400)
RBC: 3.78 10*6/uL — ABNORMAL LOW (ref 3.80–5.10)
RDW: 12 % (ref 11.0–15.0)
WBC: 9.7 10*3/uL (ref 3.8–10.8)

## 2020-04-06 LAB — RPR: RPR Ser Ql: NONREACTIVE

## 2020-04-12 ENCOUNTER — Encounter: Payer: Medicaid Other | Admitting: Family

## 2020-04-18 ENCOUNTER — Encounter: Payer: Self-pay | Admitting: Family

## 2020-04-18 ENCOUNTER — Other Ambulatory Visit: Payer: Self-pay

## 2020-04-18 ENCOUNTER — Ambulatory Visit (INDEPENDENT_AMBULATORY_CARE_PROVIDER_SITE_OTHER): Payer: Medicaid Other | Admitting: Family

## 2020-04-18 VITALS — BP 145/80 | HR 82 | Resp 16 | Ht 62.0 in | Wt 182.0 lb

## 2020-04-18 DIAGNOSIS — Z113 Encounter for screening for infections with a predominantly sexual mode of transmission: Secondary | ICD-10-CM

## 2020-04-18 DIAGNOSIS — B20 Human immunodeficiency virus [HIV] disease: Secondary | ICD-10-CM

## 2020-04-18 DIAGNOSIS — L309 Dermatitis, unspecified: Secondary | ICD-10-CM | POA: Diagnosis not present

## 2020-04-18 DIAGNOSIS — Z720 Tobacco use: Secondary | ICD-10-CM | POA: Diagnosis not present

## 2020-04-18 DIAGNOSIS — Z0000 Encounter for general adult medical examination without abnormal findings: Secondary | ICD-10-CM | POA: Diagnosis not present

## 2020-04-18 DIAGNOSIS — Z23 Encounter for immunization: Secondary | ICD-10-CM

## 2020-04-18 MED ORDER — TRIAMCINOLONE ACETONIDE 0.1 % EX CREA: 1.0000 "application " | TOPICAL_CREAM | Freq: Two times a day (BID) | CUTANEOUS | 0 refills | Status: DC

## 2020-04-18 MED ORDER — GENVOYA 150-150-200-10 MG PO TABS: 1.0000 | ORAL_TABLET | Freq: Every day | ORAL | 5 refills | Status: DC

## 2020-04-18 NOTE — Assessment & Plan Note (Signed)
[name] continues to smoke approximately 1 pack of cigarettes per day on average.  Discussed importance of tobacco cessation to reduce risk of cardiovascular, respiratory, malignant disease in the future.  She is in the precontemplation stage of quitting and not ready to quit at this time.  Continue to monitor.

## 2020-04-18 NOTE — Assessment & Plan Note (Signed)
[name] continues to have well-controlled HIV disease with good adherence and tolerance to her ART regimen of Genvoya.  No signs/symptoms of opportunistic infection or progressive HIV disease.  Reviewed lab work and discussed plan of care.  Continue current dose of Genvoya.  Plan for follow-up in 6 months or sooner if needed with lab work 1 to 2 weeks prior to appointment.

## 2020-04-18 NOTE — Progress Notes (Signed)
Subjective:    Patient ID: Vanessa Browning, female    DOB: 1965-10-18, 54 y.o.   MRN: [id number]  Chief Complaint  Patient presents with  . Follow-up    b20     HPI:  Vanessa Browning is a 54 y.o. female with HIV disease who was last seen in the office on 09/28/2019 with good adherence and tolerance to her ART regimen of Genvoya. Lab work at the time showed a viral load that was undetectable and CD4 Of 525. Most recent blood work completed on 04/04/2020 with viral load that remains undetectable with CD4 count not completed. Function, liver function, electrolytes within normal ranges. Here today for routine follow-up.  Vanessa Browning continues to take her Genvoya daily as prescribed with no adverse side effects or missed doses since her last office visit.  Overall feeling well today.  Has had some skin irritation/flares of her "scales".Denies fevers, chills, night sweats, headaches, changes in vision, neck pain/stiffness, nausea, diarrhea, vomiting, or lesions.  Vanessa Browning has no problems obtaining her medication from the pharmacy and remains covered through [facility].  Denies feelings of being down, depressed, or hopeless recently.  No recreational or illicit drug use.  Smokes approximately 1 pack of cigarettes per day and drinks alcohol socially.  Not currently sexually active and declines condoms.  Due for routine dental care.  Has received Covid booster.  Interested in receiving influenza vaccine today.   Allergies  Allergen Reactions  . Dexilant [Dexlansoprazole]     Hands swelling       Outpatient Medications Prior to Visit  Medication Sig Dispense Refill  . elvitegravir-cobicistat-emtricitabine-tenofovir (GENVOYA) 150-150-200-10 MG TABS tablet Take 1 tablet by mouth daily with breakfast. 30 tablet 5  . albuterol (VENTOLIN HFA) 108 (90 Base) MCG/ACT inhaler Inhale 1-2 puffs into the lungs every 6 (six) hours as needed for wheezing or shortness of breath. 18 g 0  . azithromycin  (ZITHROMAX) 250 MG tablet Take 1 tablet (250 mg total) by mouth daily. Take first 2 tablets together, then 1 every day until finished. 6 tablet 0  . escitalopram (LEXAPRO) 10 MG tablet Take 1 tablet (10 mg total) by mouth daily. 30 tablet 1  . guaiFENesin (ROBITUSSIN) 100 MG/5ML liquid Take 5-10 mLs (100-200 mg total) by mouth every 4 (four) hours as needed for cough. 60 mL 0  . lisinopril-hydrochlorothiazide (ZESTORETIC) 20-12.5 MG tablet Take 1 tablet by mouth daily for 14 days, THEN 2 tablets daily for 14 days. 42 tablet 0  . nicotine (NICODERM CQ) 21 mg/24hr patch Place 1 patch (21 mg total) onto the skin daily. 21 patch 1  . traZODone (DESYREL) 50 MG tablet Take 1 tablet (50 mg total) by mouth at bedtime. 30 tablet 0   No facility-administered medications prior to visit.     Past Medical History:  Diagnosis Date  . Allergy    seasonal  . GERD (gastroesophageal reflux disease)   . HIV (human immunodeficiency virus infection) ([place])   . Hyperlipidemia   . Kidney stone   . Substance abuse [facility])      Past Surgical History:  Procedure Laterality Date  . APPENDECTOMY    . APPENDECTOMY    . CHOLECYSTECTOMY    . INDUCED ABORTION    . TONSILLECTOMY         Review of Systems  Constitutional: Negative for appetite change, chills, diaphoresis, fatigue, fever and unexpected weight change.  Eyes:       Negative for acute change in vision  Respiratory: Negative for chest tightness, shortness of breath and wheezing.   Cardiovascular: Negative for chest pain.  Gastrointestinal: Negative for diarrhea, nausea and vomiting.  Genitourinary: Negative for dysuria, pelvic pain and vaginal discharge.  Musculoskeletal: Negative for neck pain and neck stiffness.  Skin: Negative for rash.  Neurological: Negative for seizures, syncope, weakness and headaches.  Hematological: Negative for adenopathy. Does not bruise/bleed easily.  Psychiatric/Behavioral: Negative for hallucinations.        Objective:    BP (!) 145/80   Pulse 82   Resp 16   Ht 5\' 2"  (1.575 m)   Wt 182 lb (82.6 kg)   SpO2 98%   BMI 33.29 kg/m  Nursing note and vital signs reviewed.  Physical Exam Constitutional:      General: She is not in acute distress.    Appearance: She is well-developed.  Eyes:     Conjunctiva/sclera: Conjunctivae normal.  Cardiovascular:     Rate and Rhythm: Normal rate and regular rhythm.     Heart sounds: Normal heart sounds. No murmur heard.  No friction rub. No gallop.   Pulmonary:     Effort: Pulmonary effort is normal. No respiratory distress.     Breath sounds: Normal breath sounds. No wheezing or rales.  Chest:     Chest wall: No tenderness.  Abdominal:     General: Bowel sounds are normal.     Palpations: Abdomen is soft.     Tenderness: There is no abdominal tenderness.  Musculoskeletal:     Cervical back: Neck supple.  Lymphadenopathy:     Cervical: No cervical adenopathy.  Skin:    General: Skin is warm and dry.     Findings: Rash (Patches of scale/dryness on left neck.) present.  Neurological:     Mental Status: She is alert and oriented to person, place, and time.  Psychiatric:        Behavior: Behavior normal.        Thought Content: Thought content normal.        Judgment: Judgment normal.       Depression screen [facility] 2/9 04/18/2020 02/12/2020 03/16/2019 03/10/2018 08/26/2017  Decreased Interest 0 2 0 0 0  Down, Depressed, Hopeless 0 3 1 0 0  PHQ - 2 Score 0 5 1 0 0  Altered sleeping - 3 - - -  Tired, decreased energy - 3 - - -  Change in appetite - 3 - - -  Feeling bad or failure about yourself  - 0 - - -  Trouble concentrating - 2 - - -  Moving slowly or fidgety/restless - 0 - - -  Suicidal thoughts - 0 - - -  PHQ-9 Score - 16 - - -  Difficult doing work/chores - Not difficult at all - - -       Assessment & Plan:    Patient Active Problem List   Diagnosis Date Noted  . Scaling eczema 04/18/2020  . Generalized anxiety disorder  02/12/2020  . Stage 2 hypertension 02/12/2020  . Healthcare maintenance 09/04/2018  . LGSIL on Pap smear of cervix 11/06/2017  . Need for influenza vaccination 03/05/2017  . Need for tetanus, diphtheria, and acellular pertussis (Tdap) vaccine 03/05/2017  . 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 History of syphilis 05/25/2016  . Obesity, Class I, BMI 30-34.9 01/20/2016  . Immune to hepatitis B 01/16/2016  . Neuropathy 01/10/2016  . Pure hypertriglyceridemia 12/30/2015  . Hyperlipidemia 10/01/2012  . Tobacco abuse 05/31/2011  . [place]  12/09/2008  . Human immunodeficiency virus (HIV) disease ([place]) 01/13/2007  . ALLERGIC RHINITIS 01/13/2007  . GERD 01/13/2007     Problem List Items Addressed This Visit      Musculoskeletal and Integument   Scaling eczema    Vanessa Browning has symptoms/exam consistent with scaling eczema likely due to dryness.  Encouraged moisturizing cream.  Start steroid cream to aid in reducing itchiness and inflammation.  Recommend follow-up with PCP or dermatology if symptoms worsen or do not improve.        Other   Human immunodeficiency virus (HIV) disease ([place]) (Chronic)    Vanessa Browning continues to have well-controlled HIV disease with good adherence and tolerance to her ART regimen of Genvoya.  No signs/symptoms of opportunistic infection or progressive HIV disease.  Reviewed lab work and discussed plan of care.  Continue current dose of Genvoya.  Plan for follow-up in 6 months or sooner if needed with lab work 1 to 2 weeks prior to appointment.      Relevant Medications   elvitegravir-cobicistat-emtricitabine-tenofovir (GENVOYA) 150-150-200-10 MG TABS tablet   Other Relevant Orders   T-helper cell (CD4)- ([facility] clinic only)   HIV-1 RNA quant-no reflex-bld   Comprehensive metabolic panel   CBC   Tobacco abuse (Chronic)    Vanessa Browning continues to smoke approximately 1 pack of cigarettes per day on average.  Discussed importance of tobacco  cessation to reduce risk of cardiovascular, respiratory, malignant disease in the future.  She is in the precontemplation stage of quitting and not ready to quit at this time.  Continue to monitor.      Healthcare maintenance     Discussed importance of safe sexual practice to reduce risk of STI.  Condoms declined.  Due for routine dental care with information for [facility] network provided to schedule appointment.  Covid vaccines up-to-date per recommendation including booster.  Influenza vaccine updated today.       Other Visit Diagnoses    Screening for STDs (sexually transmitted diseases)    -  Primary   Relevant Orders   RPR   Need for immunization against influenza       Relevant Orders   Flu Vaccine QUAD 36+ mos IM (Completed)       I have discontinued Vanessa Browning's nicotine, azithromycin, guaiFENesin, albuterol, lisinopril-hydrochlorothiazide, escitalopram, and traZODone. I am also having her start on triamcinolone cream. Additionally, I am having her maintain her Genvoya.   Meds ordered this encounter  Medications  . elvitegravir-cobicistat-emtricitabine-tenofovir (GENVOYA) 150-150-200-10 MG TABS tablet    Sig: Take 1 tablet by mouth daily with breakfast.    Dispense:  30 tablet    Refill:  5    Order Specific Question:   Supervising Provider    Answer:   Carlyle Basques [[id number]]  . triamcinolone cream (KENALOG) 0.1 %    Sig: Apply 1 application topically 2 (two) times daily.    Dispense:  30 g    Refill:  0    Order Specific Question:   Supervising Provider    Answer:   Carlyle Basques [[id number]]     Follow-up: Return in about 6 months (around 10/16/2020), or if symptoms worsen or fail to improve.   Terri Piedra, MSN, FNP-C Nurse Practitioner [facility] [place] number: [phone]

## 2020-04-18 NOTE — Assessment & Plan Note (Signed)
   Discussed importance of safe sexual practice to reduce risk of STI.  Condoms declined.  Due for routine dental care with information for [facility] network provided to schedule appointment.  Covid vaccines up-to-date per recommendation including booster.  Influenza vaccine updated today.

## 2020-04-18 NOTE — Patient Instructions (Addendum)
Nice to see you.  Continue to take your Genvoya daily as prescribed.  Refills of medication of been sent to the pharmacy.  A steroid cream has been sent to the pharmacy to help with your itching.  Plan for follow-up in 6 months or sooner if needed with lab work 1 to 2 weeks prior to appointment.  Please call [facility] at [phone] for a follow up dental appointment.   Have a great day and stay safe!

## 2020-04-18 NOTE — Assessment & Plan Note (Signed)
Vanessa Browning has symptoms/exam consistent with scaling eczema likely due to dryness.  Encouraged moisturizing cream.  Start steroid cream to aid in reducing itchiness and inflammation.  Recommend follow-up with PCP or dermatology if symptoms worsen or do not improve.

## 2020-04-20 ENCOUNTER — Other Ambulatory Visit: Payer: Self-pay

## 2020-04-20 ENCOUNTER — Ambulatory Visit (AMBULATORY_SURGERY_CENTER): Payer: Self-pay | Admitting: *Deleted

## 2020-04-20 VITALS — Ht 62.0 in | Wt 181.6 lb

## 2020-04-20 DIAGNOSIS — Z1211 Encounter for screening for malignant neoplasm of colon: Secondary | ICD-10-CM

## 2020-04-20 MED ORDER — SUPREP BOWEL PREP KIT 17.5-3.13-1.6 GM/177ML PO SOLN: 1.0000 | Freq: Once | ORAL | 0 refills | Status: AC

## 2020-04-20 NOTE — Progress Notes (Signed)
Completed covid vaccines 09-04-19  Pt is aware that care partner will wait in the car during procedure; if they feel like they will be too hot or cold to wait in the car; they may wait in the 4 th floor lobby. Patient is aware to bring only one care partner. We want them to wear a mask (we do not have any that we can provide them), practice social distancing, and we will check their temperatures when they get here.  I did remind the patient that their care partner needs to stay in the parking lot the entire time and have a cell phone available, we will call them when the pt is ready for discharge. Patient will wear mask into building.   No trouble with anesthesia, difficulty with intubation or hx/fam hx of malignant hyperthermia per pt   No egg or soy allergy  No home oxygen use   No medications for weight loss taken  emmi information given  Pt denies constipation issues  Pt has Medicaid- Suprep $3.00

## 2020-05-04 ENCOUNTER — Encounter: Payer: Medicaid Other | Admitting: Gastroenterology

## 2020-05-11 ENCOUNTER — Emergency Department (HOSPITAL_COMMUNITY): Payer: Medicaid Other

## 2020-05-11 ENCOUNTER — Other Ambulatory Visit: Payer: Self-pay

## 2020-05-11 ENCOUNTER — Emergency Department (HOSPITAL_COMMUNITY)
Admission: EM | Admit: 2020-05-11 | Discharge: 2020-05-11 | Disposition: A | Discharge status: Home / Self Care | Payer: Medicaid Other | Attending: Emergency Medicine | Admitting: Emergency Medicine

## 2020-05-11 ENCOUNTER — Encounter (HOSPITAL_COMMUNITY): Payer: Self-pay | Admitting: Emergency Medicine

## 2020-05-11 DIAGNOSIS — B349 Viral infection, unspecified: Secondary | ICD-10-CM | POA: Insufficient documentation

## 2020-05-11 DIAGNOSIS — F17210 Nicotine dependence, cigarettes, uncomplicated: Secondary | ICD-10-CM | POA: Diagnosis not present

## 2020-05-11 DIAGNOSIS — M791 Myalgia, unspecified site: Secondary | ICD-10-CM | POA: Insufficient documentation

## 2020-05-11 DIAGNOSIS — Z21 Asymptomatic human immunodeficiency virus [HIV] infection status: Secondary | ICD-10-CM | POA: Diagnosis not present

## 2020-05-11 DIAGNOSIS — R059 Cough, unspecified: Secondary | ICD-10-CM | POA: Diagnosis present

## 2020-05-11 LAB — COMPREHENSIVE METABOLIC PANEL
ALT: 52 U/L — ABNORMAL HIGH (ref 0–44)
AST: 73 U/L — ABNORMAL HIGH (ref 15–41)
Albumin: 3.4 g/dL — ABNORMAL LOW (ref 3.5–5.0)
Alkaline Phosphatase: 96 U/L (ref 38–126)
Anion gap: 10 (ref 5–15)
BUN: 7 mg/dL (ref 6–20)
CO2: 29 mmol/L (ref 22–32)
Calcium: 9.3 mg/dL (ref 8.9–10.3)
Chloride: 98 mmol/L (ref 98–111)
Creatinine, Ser: 0.86 mg/dL (ref 0.44–1.00)
GFR, Estimated: >60 mL/min (ref >60)
Glucose, Bld: 96 mg/dL (ref 70–99)
Potassium: 3.4 mmol/L — ABNORMAL LOW (ref 3.5–5.1)
Sodium: 137 mmol/L (ref 135–145)
Total Bilirubin: 0.6 mg/dL (ref 0.3–1.2)
Total Protein: 8.2 g/dL — ABNORMAL HIGH (ref 6.5–8.1)

## 2020-05-11 LAB — CBC
HCT: 36.2 % (ref 36.0–46.0)
Hemoglobin: 11.8 g/dL — ABNORMAL LOW (ref 12.0–15.0)
MCH: 29.7 pg (ref 26.0–34.0)
MCHC: 32.6 g/dL (ref 30.0–36.0)
MCV: 91.2 fL (ref 80.0–100.0)
Platelets: 313 10*3/uL (ref 150–400)
RBC: 3.97 MIL/uL (ref 3.87–5.11)
RDW: 12.3 % (ref 11.5–15.5)
WBC: 7.9 10*3/uL (ref 4.0–10.5)
nRBC: 0 % (ref 0.0–0.2)

## 2020-05-11 MED ORDER — METHOCARBAMOL 500 MG PO TABS: 500.0000 mg | ORAL_TABLET | Freq: Two times a day (BID) | ORAL | 0 refills | Status: DC

## 2020-05-11 MED ORDER — ACETAMINOPHEN 500 MG PO TABS
1000.0000 mg | ORAL_TABLET | Freq: Once | ORAL | Status: AC
  Administered 2020-05-11: 1000 mg via ORAL
  Filled 2020-05-11: qty 2

## 2020-05-11 MED ORDER — BENZONATATE 100 MG PO CAPS: 100.0000 mg | ORAL_CAPSULE | Freq: Three times a day (TID) | ORAL | 0 refills | Status: DC

## 2020-05-11 MED ORDER — PROMETHAZINE-DM 6.25-15 MG/5ML PO SYRP: 5.0000 mL | ORAL_SOLUTION | Freq: Four times a day (QID) | ORAL | 0 refills | Status: DC | PRN

## 2020-05-11 NOTE — ED Triage Notes (Signed)
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

## 2020-05-11 NOTE — ED Provider Notes (Signed)
[facility] Provider Note   [name]: [id number] Arrival date & time: 05/11/20  0750     History Chief Complaint  Patient presents with  . Generalized Body Aches    Jermisha Hoffart is a 54 y.o. female   HPI Patient is a 54 year old female with a history of HIV, HLD, substance abuse.  She states that she had HIV is a young woman states that she has never used any injection drugs.  She states that her CD4 count is normal now on Genvoya and her viral load is undetectable.  Patient is presented today with proximately 7 or 8 days of body aches and mild cough.  On further questioning she states that her cough is a chronic 1.  She is a smoker.  She denies any fevers.  She states that she has had some occasional chills she states that her low back is aching which is not necessarily a new problem but is worse than usual.  She states it is more diffuse and dull than usual.  She states that she also has some bilateral leg aches.  She denies any abdominal pain, chest pain or shortness of breath.  She states that she has a dry cough but is nonproductive.  She denies any dizziness or shortness of breath. She states that her other complaints today are chronic issues including chronic neck pain.  She states that she is followed by a doctor at [facility].  She states that she is plan to follow-up with him however that she felt she should get seen in the ER today before following up with him.  No other associated symptoms.  No aggravating or mitigating factors apart from. She has tried some muscle relaxing cream, Tylenol ibuprofen at home.  She states that these all have helped her however the relief is temporary.    Past Medical History:  Diagnosis Date  . Allergy    seasonal  . GERD (gastroesophageal reflux disease)   . HIV (human immunodeficiency virus infection) ([place])   . Hyperlipidemia   . Kidney stone   . Substance abuse [facility])      Patient Active Problem List   Diagnosis Date Noted  . Scaling eczema 04/18/2020  . Generalized anxiety disorder 02/12/2020  . Stage 2 hypertension 02/12/2020  . Healthcare maintenance 09/04/2018  . LGSIL on Pap smear of cervix 11/06/2017  . Need for influenza vaccination 03/05/2017  . Need for tetanus, diphtheria, and acellular pertussis (Tdap) vaccine 03/05/2017  . 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 History of syphilis 05/25/2016  . Obesity, Class I, BMI 30-34.9 01/20/2016  . Immune to hepatitis B 01/16/2016  . Neuropathy 01/10/2016  . Pure hypertriglyceridemia 12/30/2015  . Hyperlipidemia 10/01/2012  . Tobacco abuse 05/31/2011  . HERNIATED CERVICAL DISC 12/09/2008  . Human immunodeficiency virus (HIV) disease ([place]) 01/13/2007  . ALLERGIC RHINITIS 01/13/2007  . GERD 01/13/2007    Past Surgical History:  Procedure Laterality Date  . APPENDECTOMY    . APPENDECTOMY    . CHOLECYSTECTOMY    . INDUCED ABORTION    . TONSILLECTOMY       OB History    Gravida  6   Para      Term      Preterm      AB  1   Living        SAB      TAB      Ectopic  Multiple      Live Births  5           Family History  Problem Relation Age of Onset  . Diabetes Brother   . Hypertension Brother   . Arthritis Mother   . Healthy Father   . Stomach cancer Neg Hx   . Rectal cancer Neg Hx   . Colon cancer Neg Hx   . Colon polyps Neg Hx     Social History   Tobacco Use  . Smoking status: Heavy Tobacco Smoker    Packs/day: 1.00    Years: 30.00    Pack years: 30.00    Types: Cigarettes  . Smokeless tobacco: Never Used  . Tobacco comment: "cutting back," using patches, non-nicotine vap  Vaping Use  . Vaping Use: Former  Substance Use Topics  . Alcohol use: Yes    Alcohol/week: 0.0 standard drinks    Comment: socially  . Drug use: No    Comment: greater than 35 years ago    Home Medications Prior to Admission medications   Medication Sig Start Date  End Date Taking? Authorizing Provider  benzonatate (TESSALON) 100 MG capsule Take 1 capsule (100 mg total) by mouth every 8 (eight) hours. 05/11/20   Tedd Sias, PA  elvitegravir-cobicistat-emtricitabine-tenofovir (GENVOYA) 150-150-200-10 MG TABS tablet Take 1 tablet by mouth daily with breakfast. 04/18/20   Golden Circle, FNP  methocarbamol (ROBAXIN) 500 MG tablet Take 1 tablet (500 mg total) by mouth 2 (two) times daily. 05/11/20   Tedd Sias, PA  promethazine-dextromethorphan (PROMETHAZINE-DM) 6.25-15 MG/5ML syrup Take 5 mLs by mouth 4 (four) times daily as needed for cough. 05/11/20   Tedd Sias, PA  triamcinolone cream (KENALOG) 0.1 % Apply 1 application topically 2 (two) times daily. Patient taking differently: Apply 1 application topically 2 (two) times daily. Uses PRN 04/18/20   Golden Circle, FNP    Allergies    Dexilant [dexlansoprazole]  Review of Systems   Review of Systems  Constitutional: Positive for chills and fatigue. Negative for fever.  HENT: Negative for congestion.   Respiratory: Positive for cough. Negative for shortness of breath.   Cardiovascular: Negative for chest pain.  Gastrointestinal: Negative for abdominal distention, diarrhea, nausea and vomiting.  Musculoskeletal: Positive for back pain and myalgias.  Neurological: Negative for dizziness and headaches.    Physical Exam Updated Vital Signs BP (!) 141/95 (BP Location: Right Arm)   Pulse 84   Temp 99.2 F (37.3 C) (Oral)   Resp (!) 26   Ht 5\' 2"  (1.575 m)   Wt 83 kg   SpO2 98%   BMI 33.47 kg/m   Physical Exam Vitals and nursing note reviewed.  Constitutional:      General: She is not in acute distress.    Comments: Pleasant well-appearing 54 year old.  In no acute distress.  Sitting comfortably in bed.  Able answer questions appropriately follow commands. No increased work of breathing. Speaking in full sentences.  HENT:     Head: Normocephalic and atraumatic.     Nose:  Nose normal.  Eyes:     General: No scleral icterus. Cardiovascular:     Rate and Rhythm: Normal rate and regular rhythm.     Pulses: Normal pulses.     Heart sounds: Normal heart sounds.  Pulmonary:     Effort: Pulmonary effort is normal. No respiratory distress.     Breath sounds: Normal breath sounds. No wheezing.  Abdominal:     Palpations: Abdomen  is soft.     Tenderness: There is no abdominal tenderness. There is no guarding or rebound.  Musculoskeletal:     Cervical back: Normal range of motion.     Right lower leg: No edema.     Left lower leg: No edema.     Comments: Diffuse paralumbar muscular tenderness right side greater than left.  No midline tenderness.  No calf tenderness or swelling.  No unilateral leg swelling.  No edema.  Skin:    General: Skin is warm and dry.     Capillary Refill: Capillary refill takes less than 2 seconds.  Neurological:     Mental Status: She is alert. Mental status is at baseline.     Comments: Patient is alert and oriented x3, pleasant  Strength within normal limits Sensation within normal limits Spontaneously moving all 4 extremities with good coordination.  Psychiatric:        Mood and Affect: Mood normal.        Behavior: Behavior normal.     ED Results / Procedures / Treatments   Labs (all labs ordered are listed, but only abnormal results are displayed) Labs Reviewed  CBC - Abnormal; Notable for the following components:      Result Value   Hemoglobin 11.8 (*)    All other components within normal limits  COMPREHENSIVE METABOLIC PANEL    EKG None  Radiology DG Chest 2 View  Result Date: 05/11/2020 CLINICAL DATA:  Cough and chest pain EXAM: CHEST - 2 VIEW COMPARISON:  July 26, 2011 FINDINGS: There is slight left base atelectasis. The lungs otherwise are clear. The heart size and pulmonary vascularity are normal. No adenopathy. No bone lesions. IMPRESSION: Mild left base atelectasis. Lungs elsewhere clear. Cardiac  silhouette normal. Electronically Signed   By: Lowella Grip III M.D.   On: 05/11/2020 08:50    Procedures Procedures (including critical care time)  Medications Ordered in ED Medications  acetaminophen (TYLENOL) tablet 1,000 mg (has no administration in time range)    ED Course  I have reviewed the triage vital signs and the nursing notes.  Pertinent labs & imaging results that were available during my care of the patient were reviewed by me and considered in my medical decision making (see chart for details).    MDM Rules/Calculators/A&P                          Patient 54 year old female presented today with well-controlled HIV.  She is overall very well-appearing on physical exam she has some diffuse low back tenderness with muscular spasm.  Her vital signs are within normal limits apart from mild hypertension.  She is not tachypneic on my examination is neither tachycardic or hypoxic.  She has very low-grade elevation in her temperature 99.2 she has taken no antipyretics this morning.  Suspect that some of her symptoms are likely from a viral illness she does endorse a mildly worse cough than usual.  Her chest x-ray is clear apart from some atelectasis and I do not suspect that she has pneumonia or would benefit from antibiotic therapy.  However I did recommend that she closely follow-up with her primary care doctor for reevaluation to monitor symptoms and return to the ER should she spike a fever.  She is agreeable to plan to discharge with Tylenol, ibuprofen and muscle relaxer Robaxin.  I reviewed her CBC and CMP.  CBC is without leukocytosis mild stable anemia.  CMP within normal  limits without any acute abnormalities apart from very mild transaminitis.  She is made aware of this and will follow up with her primary care doctor about this when she sees him next week/this week.  2 view chest x-ray within normal limits no acute abnormalities.  Patient was given Robaxin as well as  cough medicine. She understands strict return precautions as well as follow-up plan and states that she will call to make an appointment today with her primary care doctor.  All questions answered to the best of my ability.  Final Clinical Impression(s) / ED Diagnoses Final diagnoses:  Viral illness  Muscle ache    Rx / DC Orders ED Discharge Orders         Ordered    methocarbamol (ROBAXIN) 500 MG tablet  2 times daily        05/11/20 0930    promethazine-dextromethorphan (PROMETHAZINE-DM) 6.25-15 MG/5ML syrup  4 times daily PRN        05/11/20 0934    benzonatate (TESSALON) 100 MG capsule  Every 8 hours        05/11/20 [place], Sable Knoles S, PA 05/11/20 0518    Veryl Speak, MD 05/11/20 1642

## 2020-05-11 NOTE — Discharge Instructions (Addendum)
As we discussed, I suspect your symptoms are due to a viral illness which is exacerbating some chronic body aches.  He also seem to have some muscular spasm in your low back.   Please do warm compresses in the painful area you may also use Voltaren gel or icy hot over-the-counter.  Plenty of water.  Alternate Tylenol and ibuprofen.  He may also use muscle relaxer although this may make you drowsy.  I have written you prescription for this. Please use Tylenol or ibuprofen for pain.  You may use 600 mg ibuprofen every 6 hours or 1000 mg of Tylenol every 6 hours.  You may choose to alternate between the 2.  This would be most effective.  Not to exceed 4 g of Tylenol within 24 hours.  Not to exceed 3200 mg ibuprofen 24 hours.  Please follow-up with your primary care doctor.  Please return to the ER for any new or concerning symptoms such as as we discussed.

## 2020-05-20 ENCOUNTER — Encounter: Payer: Self-pay | Admitting: Gastroenterology

## 2020-05-20 ENCOUNTER — Other Ambulatory Visit: Payer: Self-pay

## 2020-05-20 ENCOUNTER — Ambulatory Visit (AMBULATORY_SURGERY_CENTER): Payer: Medicaid Other | Admitting: Gastroenterology

## 2020-05-20 VITALS — BP 148/77 | HR 72 | Temp 96.2°F | Resp 28 | Ht 62.0 in | Wt 181.0 lb

## 2020-05-20 DIAGNOSIS — Z1211 Encounter for screening for malignant neoplasm of colon: Secondary | ICD-10-CM | POA: Diagnosis not present

## 2020-05-20 DIAGNOSIS — D122 Benign neoplasm of ascending colon: Secondary | ICD-10-CM

## 2020-05-20 MED ORDER — SODIUM CHLORIDE 0.9 % IV SOLN: 500.0000 mL | Freq: Once | INTRAVENOUS | Status: DC

## 2020-05-20 NOTE — Progress Notes (Signed)
VS done by [place].  previsit completed by KW.  No changes since previsit.

## 2020-05-20 NOTE — Op Note (Signed)
[facility] Patient Name: Vanessa Browning ZOXWR Procedure Date: 05/20/2020 10:06 AM MRN: [id number] Endoscopist: Milus Banister , MD Age: 54 Referring MD:  Date of Birth: [birth year] Gender: Female Account #: [id number] Procedure:                Colonoscopy Indications:              Screening for colorectal malignant neoplasm Medicines:                Monitored Anesthesia Care Procedure:                Pre-Anesthesia Assessment:                           - Prior to the procedure, a History and Physical                            was performed, and patient medications and                            allergies were reviewed. The patient's tolerance of                            previous anesthesia was also reviewed. The risks                            and benefits of the procedure and the sedation                            options and risks were discussed with the patient.                            All questions were answered, and informed consent                            was obtained. Prior Anticoagulants: The patient has                            taken no previous anticoagulant or antiplatelet                            agents. ASA Grade Assessment: II - A patient with                            mild systemic disease. After reviewing the risks                            and benefits, the patient was deemed in                            satisfactory condition to undergo the procedure.                           After obtaining informed consent, the colonoscope  was passed under direct vision. Throughout the                            procedure, the patient's blood pressure, pulse, and                            oxygen saturations were monitored continuously. The                            Colonoscope was introduced through the anus and                            advanced to the the cecum, identified by                            appendiceal  orifice and ileocecal valve. The                            colonoscopy was performed without difficulty. The                            patient tolerated the procedure well. The quality                            of the bowel preparation was good. The ileocecal                            valve, appendiceal orifice, and rectum were                            photographed. Scope In: 10:15:56 AM Scope Out: 10:24:57 AM Scope Withdrawal Time: 0 hours 6 minutes 20 seconds  Total Procedure Duration: 0 hours 9 minutes 1 second  Findings:                 A 1 mm polyp was found in the ascending colon. The                            polyp was sessile. The polyp was removed with a                            cold biopsy forceps. Resection and retrieval were                            complete.                           The exam was otherwise without abnormality on                            direct and retroflexion views. Complications:            No immediate complications. Estimated blood loss:  None. Estimated Blood Loss:     Estimated blood loss: none. Impression:               - One 1 mm polyp in the ascending colon, removed                            with a cold biopsy forceps. Resected and retrieved.                           - The examination was otherwise normal on direct                            and retroflexion views. Recommendation:           - Patient has a contact number available for                            emergencies. The signs and symptoms of potential                            delayed complications were discussed with the                            patient. Return to normal activities tomorrow.                            Written discharge instructions were provided to the                            patient.                           - Resume previous diet.                           - Continue present medications.                           - Await  pathology results. Milus Banister, MD 05/20/2020 10:26:30 AM This report has been signed electronically.

## 2020-05-20 NOTE — Patient Instructions (Signed)
Handout provided on polyps.   YOU HAD AN ENDOSCOPIC PROCEDURE TODAY AT THE [facility]:   Refer to the procedure report that was given to you for any specific questions about what was found during the examination.  If the procedure report does not answer your questions, please call your gastroenterologist to clarify.  If you requested that your care partner not be given the details of your procedure findings, then the procedure report has been included in a sealed envelope for you to review at your convenience later.  YOU SHOULD EXPECT: Some feelings of bloating in the abdomen. Passage of more gas than usual.  Walking can help get rid of the air that was put into your GI tract during the procedure and reduce the bloating. If you had a lower endoscopy (such as a colonoscopy or flexible sigmoidoscopy) you may notice spotting of blood in your stool or on the toilet paper. If you underwent a bowel prep for your procedure, you may not have a normal bowel movement for a few days.  Please Note:  You might notice some irritation and congestion in your nose or some drainage.  This is from the oxygen used during your procedure.  There is no need for concern and it should clear up in a day or so.  SYMPTOMS TO REPORT IMMEDIATELY:  Following lower endoscopy (colonoscopy or flexible sigmoidoscopy):  Excessive amounts of blood in the stool  Significant tenderness or worsening of abdominal pains  Swelling of the abdomen that is new, acute  Fever of 100F or higher  For urgent or emergent issues, a gastroenterologist can be reached at any hour by calling [phone]. Do not use MyChart messaging for urgent concerns.    DIET:  We do recommend a small meal at first, but then you may proceed to your regular diet.  Drink plenty of fluids but you should avoid alcoholic beverages for 24 hours.  ACTIVITY:  You should plan to take it easy for the rest of today and you should NOT DRIVE or use heavy  machinery until tomorrow (because of the sedation medicines used during the test).    FOLLOW UP: Our staff will call the number listed on your records 48-72 hours following your procedure to check on you and address any questions or concerns that you may have regarding the information given to you following your procedure. If we do not reach you, we will leave a message.  We will attempt to reach you two times.  During this call, we will ask if you have developed any symptoms of COVID 19. If you develop any symptoms (ie: fever, flu-like symptoms, shortness of breath, cough etc.) before then, please call [phone].  If you test positive for Covid 19 in the 2 weeks post procedure, please call and report this information to us.    If any biopsies were taken you will be contacted by phone or by letter within the next 1-3 weeks.  Please call us at [phone] if you have not heard about the biopsies in 3 weeks.    SIGNATURES/CONFIDENTIALITY: You and/or your care partner have signed paperwork which will be entered into your electronic medical record.  These signatures attest to the fact that that the information above on your After Visit Summary has been reviewed and is understood.  Full responsibility of the confidentiality of this discharge information lies with you and/or your care-partner.  

## 2020-05-20 NOTE — Progress Notes (Signed)
Called to room to assist during endoscopic procedure.  Patient ID and intended procedure confirmed with present staff. Received instructions for my participation in the procedure from the performing physician.  

## 2020-05-20 NOTE — Progress Notes (Signed)
Report given to PACU, vss 

## 2020-05-24 ENCOUNTER — Telehealth: Payer: Self-pay | Admitting: *Deleted

## 2020-05-24 NOTE — Telephone Encounter (Signed)
  Follow up Call-  Call back number 05/20/2020  Post procedure Call Back phone  # [phone]  Permission to leave phone message Yes  Some recent data might be hidden     Patient questions:  Do you have a fever, pain , or abdominal swelling? No. Pain Score  0 *  Have you tolerated food without any problems? Yes.    Have you been able to return to your normal activities? Yes.    Do you have any questions about your discharge instructions: Diet   No. Medications  No. Follow up visit  No.  Do you have questions or concerns about your Care? No.  Actions: * If pain score is 4 or above: No action needed, pain <4.  1. Have you developed a fever since your procedure? no  2.   Have you had an respiratory symptoms (SOB or cough) since your procedure? no  3.   Have you tested positive for COVID 19 since your procedure no  4.   Have you had any family members/close contacts diagnosed with the COVID 19 since your procedure?  no   If yes to any of these questions please route to Joylene John, RN and Joella Prince, RN

## 2020-05-25 ENCOUNTER — Encounter: Payer: Self-pay | Admitting: Gastroenterology

## 2020-10-17 ENCOUNTER — Other Ambulatory Visit: Payer: Medicaid Other

## 2020-10-24 ENCOUNTER — Other Ambulatory Visit: Payer: Self-pay

## 2020-10-24 ENCOUNTER — Other Ambulatory Visit: Payer: Medicaid Other

## 2020-10-24 DIAGNOSIS — Z113 Encounter for screening for infections with a predominantly sexual mode of transmission: Secondary | ICD-10-CM

## 2020-10-24 DIAGNOSIS — B20 Human immunodeficiency virus [HIV] disease: Secondary | ICD-10-CM

## 2020-10-25 LAB — T-HELPER CELL (CD4) - (RCID CLINIC ONLY)
CD4 % Helper T Cell: 21 % — ABNORMAL LOW (ref 33–65)
CD4 T Cell Abs: 452 /uL (ref 400–1790)

## 2020-10-27 LAB — COMPREHENSIVE METABOLIC PANEL
AG Ratio: 1 (calc) (ref 1.0–2.5)
ALT: 30 U/L — ABNORMAL HIGH (ref 6–29)
AST: 30 U/L (ref 10–35)
Albumin: 4.2 g/dL (ref 3.6–5.1)
Alkaline phosphatase (APISO): 104 U/L (ref 37–153)
BUN: 14 mg/dL (ref 7–25)
CO2: 30 mmol/L (ref 20–32)
Calcium: 9.9 mg/dL (ref 8.6–10.4)
Chloride: 102 mmol/L (ref 98–110)
Creat: 0.7 mg/dL (ref 0.50–1.05)
Globulin: 4.2 g/dL (calc) — ABNORMAL HIGH (ref 1.9–3.7)
Glucose, Bld: 90 mg/dL (ref 65–99)
Potassium: 3.7 mmol/L (ref 3.5–5.3)
Sodium: 139 mmol/L (ref 135–146)
Total Bilirubin: 0.5 mg/dL (ref 0.2–1.2)
Total Protein: 8.4 g/dL — ABNORMAL HIGH (ref 6.1–8.1)

## 2020-10-27 LAB — RPR: RPR Ser Ql: NONREACTIVE

## 2020-10-27 LAB — CBC
HCT: 39.8 % (ref 35.0–45.0)
Hemoglobin: 12.7 g/dL (ref 11.7–15.5)
MCH: 29.7 pg (ref 27.0–33.0)
MCHC: 31.9 g/dL — ABNORMAL LOW (ref 32.0–36.0)
MCV: 93 fL (ref 80.0–100.0)
MPV: 10.9 fL (ref 7.5–12.5)
Platelets: 274 10*3/uL (ref 140–400)
RBC: 4.28 10*6/uL (ref 3.80–5.10)
RDW: 11.9 % (ref 11.0–15.0)
WBC: 8.2 10*3/uL (ref 3.8–10.8)

## 2020-10-27 LAB — HIV-1 RNA QUANT-NO REFLEX-BLD
HIV 1 RNA Quant: 111 Copies/mL — ABNORMAL HIGH
HIV-1 RNA Quant, Log: 2.05 Log cps/mL — ABNORMAL HIGH

## 2020-10-31 ENCOUNTER — Encounter: Payer: Medicaid Other | Admitting: Family

## 2020-11-01 ENCOUNTER — Encounter: Payer: Self-pay | Admitting: Gastroenterology

## 2020-11-02 ENCOUNTER — Other Ambulatory Visit: Payer: Self-pay

## 2020-11-02 ENCOUNTER — Encounter: Payer: Self-pay | Admitting: Family

## 2020-11-02 ENCOUNTER — Ambulatory Visit (INDEPENDENT_AMBULATORY_CARE_PROVIDER_SITE_OTHER): Payer: Medicaid Other | Admitting: Family

## 2020-11-02 VITALS — BP 147/82 | HR 84 | Temp 98.1°F | Wt 173.0 lb

## 2020-11-02 DIAGNOSIS — Z720 Tobacco use: Secondary | ICD-10-CM

## 2020-11-02 DIAGNOSIS — B20 Human immunodeficiency virus [HIV] disease: Secondary | ICD-10-CM | POA: Diagnosis not present

## 2020-11-02 DIAGNOSIS — Z0000 Encounter for general adult medical examination without abnormal findings: Secondary | ICD-10-CM

## 2020-11-02 MED ORDER — NICOTINE 21 MG/24HR TD PT24: 21.0000 mg | MEDICATED_PATCH | Freq: Every day | TRANSDERMAL | 0 refills | Status: DC

## 2020-11-02 MED ORDER — GENVOYA 150-150-200-10 MG PO TABS: 1.0000 | ORAL_TABLET | Freq: Every day | ORAL | 5 refills | Status: DC

## 2020-11-02 NOTE — Assessment & Plan Note (Signed)
   Discussed importance of safe sexual practices to reduce risk of STI.  Condoms declined.  Routine dental care up-to-date per recommendations.  COVID vaccinated and boosted.  All other immunizations are up-to-date per recommendations.

## 2020-11-02 NOTE — Patient Instructions (Signed)
Nice to see you.   Continue to take your medications daily and use pill box and reminders.  Refills have been sent to the pharmacy.  Start the nicotine patches.   Plan for follow up in 6 weeks or sooner if needed. Blood work on the same day.  Have a great day!

## 2020-11-02 NOTE — Assessment & Plan Note (Signed)
[name] is interested in quitting smoking through use of the patches.  She is very close to pollution stage of quitting and has a plan established. Start nicotine patch at 21 mg for 6 weeks. Information on Quit Hotline provided. Will continue to monitor.

## 2020-11-02 NOTE — Assessment & Plan Note (Signed)
[name] has less than optimal regimen of Genvoya as evidenced by her viral load increasing from undetectable to 111.  Discussed methods to remember taking medication calculating pill boxes and alarm/reminders.  No signs/symptoms of opportunistic infection.  We reviewed lab work and discussed plan of care.  Continue current dose of Genvoya.  Plan for follow-up in 6 weeks or sooner if needed with lab work on the same day.

## 2020-11-02 NOTE — Progress Notes (Signed)
Brief Narrative   Patient ID: Vanessa Browning, female    DOB: 1966-03-15, 55 y.o.   MRN: [id number]    Subjective:    Chief Complaint  Patient presents with  . Follow-up    B20   HPI:  Vanessa Browning is a 55 y.o. female with HIV disease last seen on 04/18/2020 with well-controlled virus and good adherence and tolerance to her ART regimen of Genvoya.  Viral load was undetectable with CD4 count of 525.  Most recent lab work completed on 10/24/2020 with viral load of 111 and CD4 count of 452.  Kidney function, liver function, electrolytes within normal ranges.  Here today for routine follow-up.  [name] has had less than optimal adherence to her medication since last office visit with several missed doses.  Continues to have good tolerance.  Biggest issue is remembering to take medication daily.  Having issues with decreased appetite. Denies fevers, chills, night sweats, headaches, changes in vision, neck pain/stiffness, nausea, diarrhea, vomiting, lesions or rashes.  [name] has no problems obtaining medication from the pharmacy and remains covered through [facility].  Denies feelings of being down, depressed, or hopeless recently.  No recreational or illicit drug use with occasional alcohol consumption and smokes approximately 1/2 pack of cigarettes per day.  She is interested in going back on the nicotine patch.  Condoms offered and declined.  She is COVID vaccinated and boosted.  Routine dental care is up-to-date per recommendations.   Allergies  Allergen Reactions  . Dexilant [Dexlansoprazole]     Hands swelling       Outpatient Medications Prior to Visit  Medication Sig Dispense Refill  . acetaminophen (TYLENOL) 325 MG tablet Take 650 mg by mouth every 6 (six) hours as needed.    . elvitegravir-cobicistat-emtricitabine-tenofovir (GENVOYA) 150-150-200-10 MG TABS tablet Take 1 tablet by mouth daily with breakfast. 30 tablet 5  . ibuprofen (ADVIL) 200 MG tablet Take  400 mg by mouth every 6 (six) hours as needed.    . nicotine (NICODERM CQ - DOSED IN MG/24 HR) 7 mg/24hr patch Place 7 mg onto the skin daily.    Marland Kitchen triamcinolone cream (KENALOG) 0.1 % Apply 1 application topically 2 (two) times daily. (Patient taking differently: Apply 1 application topically 2 (two) times daily. Uses PRN) 30 g 0  . benzonatate (TESSALON) 100 MG capsule Take 1 capsule (100 mg total) by mouth every 8 (eight) hours. (Patient not taking: Reported on 11/02/2020) 21 capsule 0  . methocarbamol (ROBAXIN) 500 MG tablet Take 1 tablet (500 mg total) by mouth 2 (two) times daily. (Patient not taking: Reported on 11/02/2020) 20 tablet 0  . promethazine-dextromethorphan (PROMETHAZINE-DM) 6.25-15 MG/5ML syrup Take 5 mLs by mouth 4 (four) times daily as needed for cough. (Patient not taking: Reported on 11/02/2020) 118 mL 0   No facility-administered medications prior to visit.     Past Medical History:  Diagnosis Date  . Allergy    seasonal  . GERD (gastroesophageal reflux disease)   . HIV (human immunodeficiency virus infection) ([place])   . Hyperlipidemia   . Kidney stone   . Substance abuse [facility])      Past Surgical History:  Procedure Laterality Date  . APPENDECTOMY    . APPENDECTOMY    . CHOLECYSTECTOMY    . INDUCED ABORTION    . TONSILLECTOMY         Review of Systems  Constitutional: Negative for appetite change, chills, diaphoresis, fatigue, fever and unexpected weight change.  Eyes:       Negative for acute change in vision  Respiratory: Negative for chest tightness, shortness of breath and wheezing.   Cardiovascular: Negative for chest pain.  Gastrointestinal: Negative for diarrhea, nausea and vomiting.  Genitourinary: Negative for dysuria, pelvic pain and vaginal discharge.  Musculoskeletal: Negative for neck pain and neck stiffness.  Skin: Negative for rash.  Neurological: Negative for seizures, syncope, weakness and headaches.  Hematological: Negative for  adenopathy. Does not bruise/bleed easily.  Psychiatric/Behavioral: Negative for hallucinations.      Objective:    BP (!) 147/82   Pulse 84   Temp 98.1 F (36.7 C) (Oral)   Wt 173 lb (78.5 kg)   BMI 31.64 kg/m  Nursing note and vital signs reviewed.  Physical Exam Constitutional:      General: She is not in acute distress.    Appearance: She is well-developed.  Eyes:     Conjunctiva/sclera: Conjunctivae normal.  Cardiovascular:     Rate and Rhythm: Normal rate and regular rhythm.     Heart sounds: Normal heart sounds. No murmur heard. No friction rub. No gallop.   Pulmonary:     Effort: Pulmonary effort is normal. No respiratory distress.     Breath sounds: Normal breath sounds. No wheezing or rales.  Chest:     Chest wall: No tenderness.  Abdominal:     General: Bowel sounds are normal.     Palpations: Abdomen is soft.     Tenderness: There is no abdominal tenderness.  Musculoskeletal:     Cervical back: Neck supple.  Lymphadenopathy:     Cervical: No cervical adenopathy.  Skin:    General: Skin is warm and dry.     Findings: No rash.  Neurological:     Mental Status: She is alert and oriented to person, place, and time.  Psychiatric:        Behavior: Behavior normal.        Thought Content: Thought content normal.        Judgment: Judgment normal.      Depression screen [facility] 2/9 11/02/2020 04/18/2020 02/12/2020 03/16/2019 03/10/2018  Decreased Interest 0 0 2 0 0  Down, Depressed, Hopeless 0 0 3 1 0  PHQ - 2 Score 0 0 5 1 0  Altered sleeping - - 3 - -  Tired, decreased energy - - 3 - -  Change in appetite - - 3 - -  Feeling bad or failure about yourself  - - 0 - -  Trouble concentrating - - 2 - -  Moving slowly or fidgety/restless - - 0 - -  Suicidal thoughts - - 0 - -  PHQ-9 Score - - 16 - -  Difficult doing work/chores - - Not difficult at all - -       Assessment & Plan:    Patient Active Problem List   Diagnosis Date Noted  . Scaling eczema  04/18/2020  . Generalized anxiety disorder 02/12/2020  . Stage 2 hypertension 02/12/2020  . Healthcare maintenance 09/04/2018  . LGSIL on Pap smear of cervix 11/06/2017  . Need for influenza vaccination 03/05/2017  . Need for tetanus, diphtheria, and acellular pertussis (Tdap) vaccine 03/05/2017  . 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 History of syphilis 05/25/2016  . Obesity, Class I, BMI 30-34.9 01/20/2016  . Immune to hepatitis B 01/16/2016  . Neuropathy 01/10/2016  . Pure hypertriglyceridemia 12/30/2015  . Hyperlipidemia 10/01/2012  . Tobacco abuse 05/31/2011  . HERNIATED CERVICAL DISC 12/09/2008  .  Human immunodeficiency virus (HIV) disease ([place]) 01/13/2007  . ALLERGIC RHINITIS 01/13/2007  . GERD 01/13/2007     Problem List Items Addressed This Visit   None      I am having [name] maintain her Genvoya, triamcinolone cream, methocarbamol, promethazine-dextromethorphan, benzonatate, nicotine, acetaminophen, and ibuprofen.   No orders of the defined types were placed in this encounter.    Follow-up: No follow-ups on file.   Terri Piedra, MSN, FNP-C Nurse Practitioner [facility] [place] number: [phone]

## 2020-11-03 ENCOUNTER — Encounter: Payer: Self-pay | Admitting: *Deleted

## 2020-12-07 ENCOUNTER — Other Ambulatory Visit: Payer: Self-pay | Admitting: Family

## 2020-12-13 ENCOUNTER — Encounter: Payer: Self-pay | Admitting: *Deleted

## 2020-12-13 IMAGING — DX DG LUMBAR SPINE COMPLETE 4+V
5 series · 5 of 5 positions shown · non-contrast
Comparison: 03/09/2011, 10/29/2007.

CLINICAL DATA: Fall on 08/03/2018, persistent low back pain.
Initial imaging encounter.

EXAM:
LUMBAR SPINE - COMPLETE 4+ VIEW

[l-spine ap]
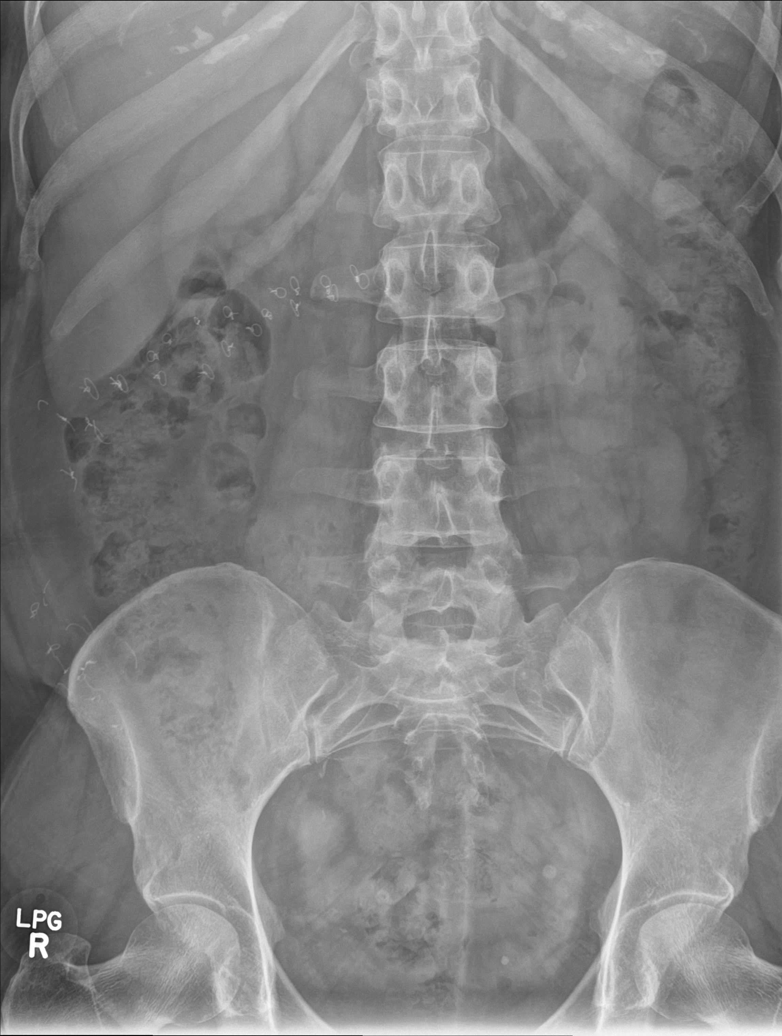

[l-spine obl (1 of 2)]
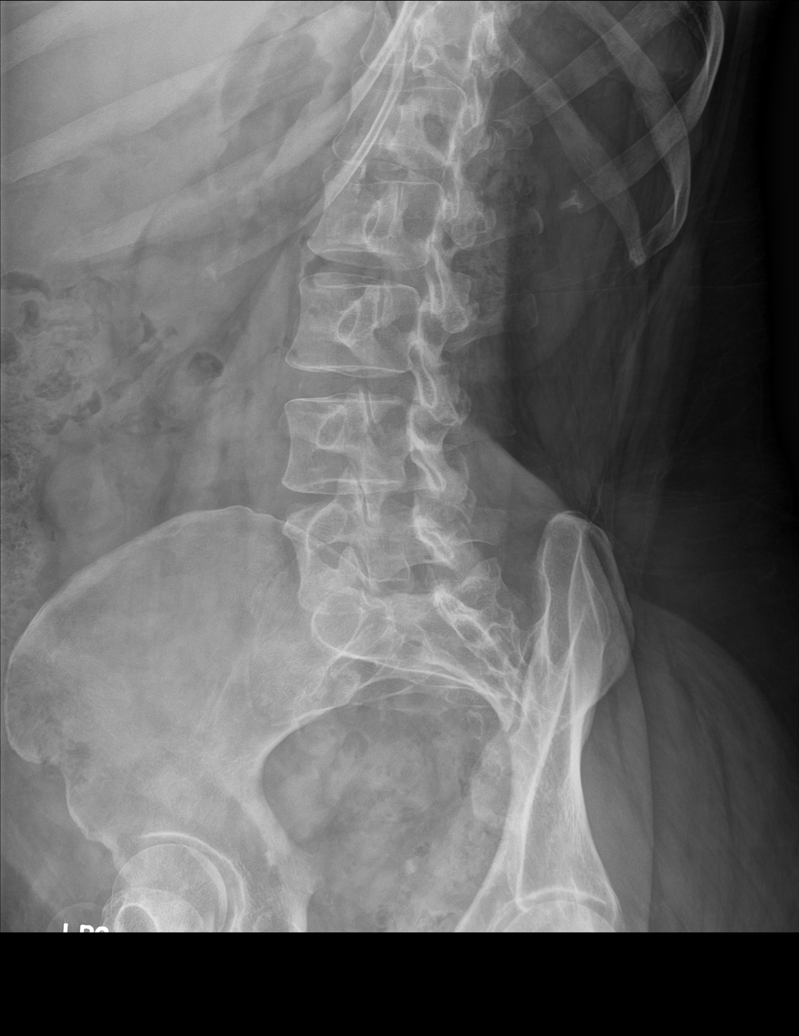

[l-spine obl (2 of 2)]
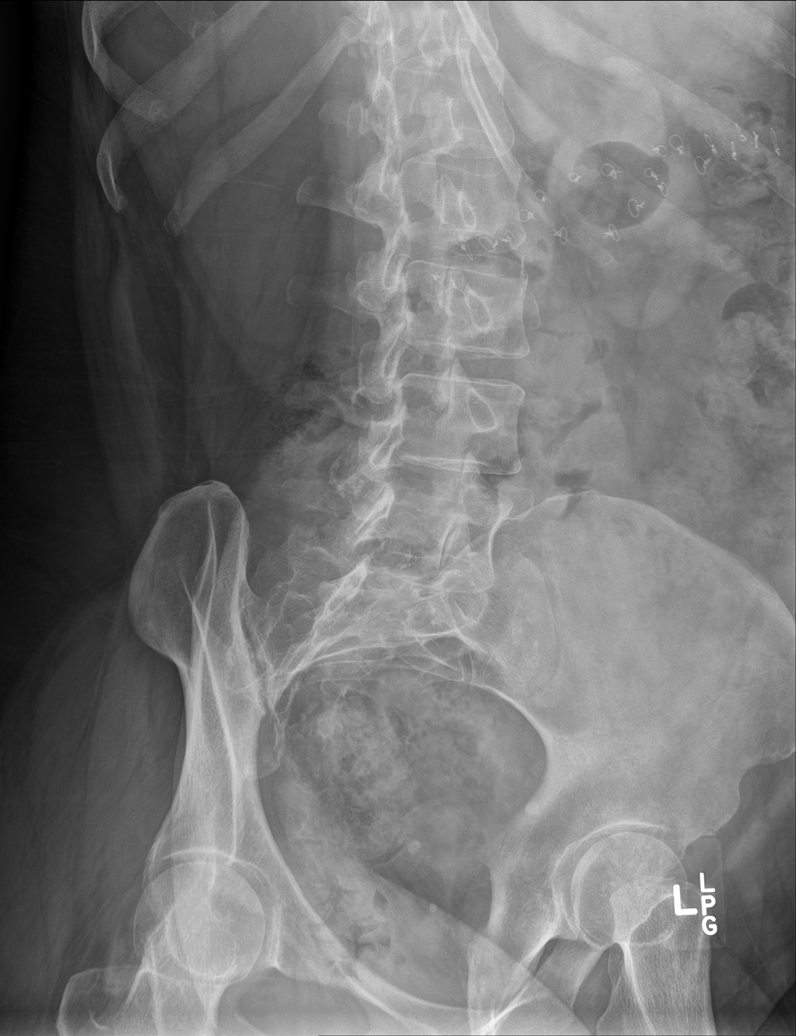

[l-spine lat]
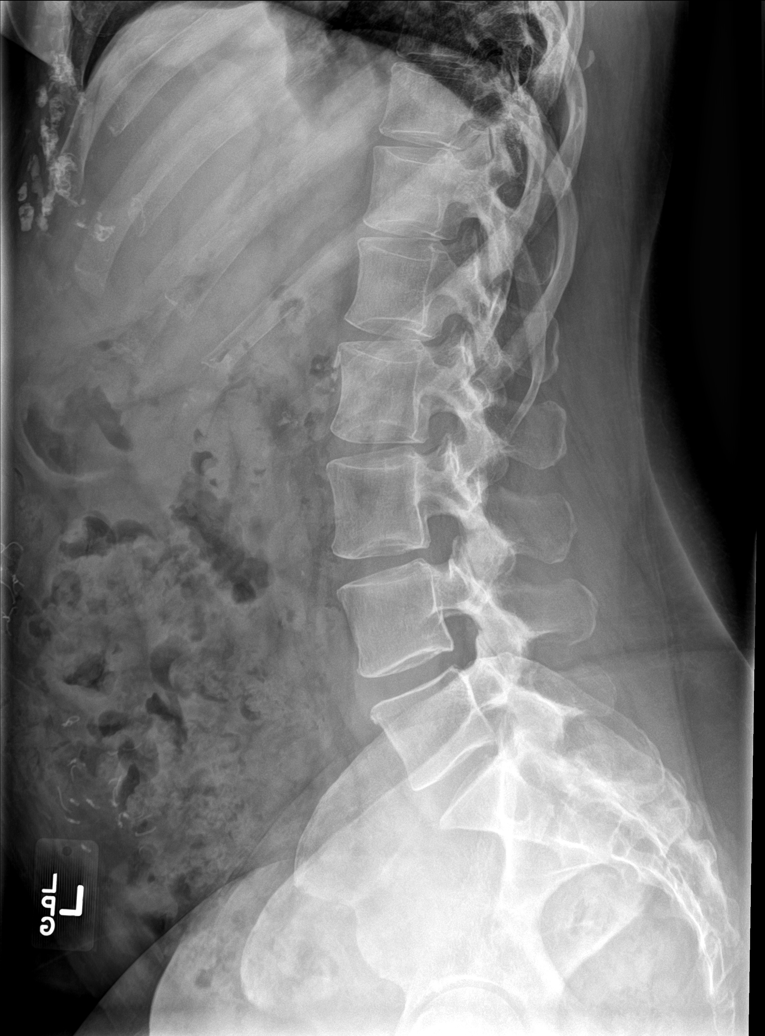

[l-spine spot]
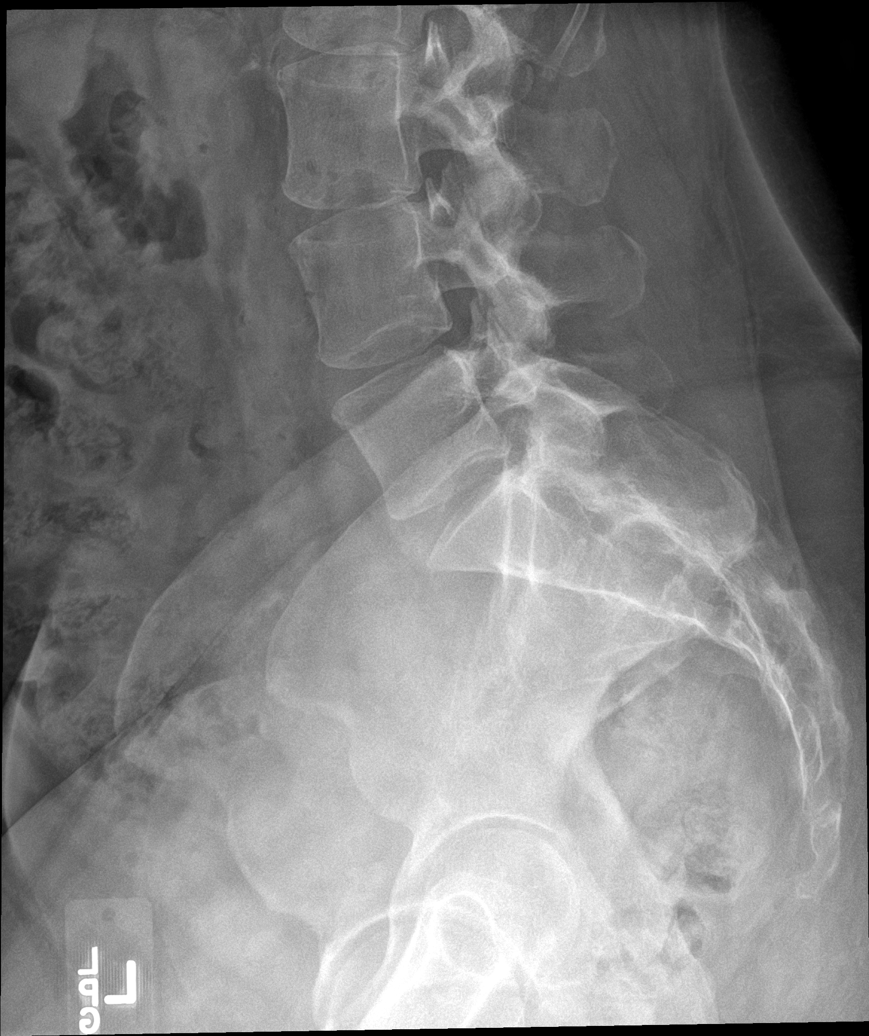

[5 of 5 positions shown; findings below may reference images not displayed]

FINDINGS: Five non-rib-bearing lumbar vertebrae with anatomic alignment. No
fractures. Mild disc space narrowing at L4-5, unchanged. Remaining
disc spaces well-preserved. No pars defects. Mild facet degenerative
changes at L4-5 and L5-S1. Sacroiliac joints intact. Mild abdominal
aortic atherosclerosis.
IMPRESSION: 1. No acute osseous abnormality.
2. Stable mild degenerative disc disease at L4-5 and stable mild
facet degenerative changes at L4-5 and L5-S1.

## 2020-12-21 ENCOUNTER — Other Ambulatory Visit: Payer: Self-pay

## 2020-12-21 ENCOUNTER — Encounter: Payer: Self-pay | Admitting: Family

## 2020-12-21 ENCOUNTER — Ambulatory Visit: Payer: Medicaid Other | Admitting: Family

## 2020-12-21 VITALS — BP 129/65 | HR 75 | Temp 97.5°F | Wt 171.6 lb

## 2020-12-21 DIAGNOSIS — Z720 Tobacco use: Secondary | ICD-10-CM | POA: Diagnosis not present

## 2020-12-21 DIAGNOSIS — Z0000 Encounter for general adult medical examination without abnormal findings: Secondary | ICD-10-CM

## 2020-12-21 DIAGNOSIS — B20 Human immunodeficiency virus [HIV] disease: Secondary | ICD-10-CM | POA: Diagnosis not present

## 2020-12-21 MED ORDER — NICOTINE 21 MG/24HR TD PT24: MEDICATED_PATCH | TRANSDERMAL | 0 refills | Status: DC

## 2020-12-21 NOTE — Assessment & Plan Note (Signed)
[name] has improved adherence with good tolerance of her ART regimen of Genvoya.  No signs/symptoms of opportunistic infection or progressive HIV.  We reviewed previous lab work and discussed plan of care.  Check blood work today.  Continue current dose of Genvoya.  Plan for follow-up in 3 months or sooner if needed with lab work on the same day.

## 2020-12-21 NOTE — Progress Notes (Signed)
Brief Narrative   Patient ID: Vanessa Browning, female    DOB: 1965-11-26, 55 y.o.   MRN: [id number]    Subjective:    Chief Complaint  Patient presents with   Follow-up     HPI:  Vanessa Browning is a 55 y.o. female with HIV disease last seen on 11/02/2020 with less than optimal adherence to her ART regimen of Genvoya.  Viral load at the time was 111 with CD4 count of 452.  Also started on nicotine patch with goal of tobacco cessation.  Here today for routine follow-up.  [name] has been taking her Genvoya daily as prescribed with no adverse side effects or missed doses since her last office visit.  She is cut down on her tobacco use from 1 pack/day to 4 cigarettes/day and feels the patches are doing a great job with helping her tobacco cessation. Denies fevers, chills, night sweats, headaches, changes in vision, neck pain/stiffness, nausea, diarrhea, vomiting, lesions or rashes.  [name] has no problems obtaining medication from the pharmacy and remains covered through [facility].  Denies feelings of being down, depressed, or hopeless recently.  No recreational/illicit drug use with social alcohol consumption. Declines condoms.    Allergies  Allergen Reactions   Dexilant [Dexlansoprazole]     Hands swelling       Outpatient Medications Prior to Visit  Medication Sig Dispense Refill   acetaminophen (TYLENOL) 325 MG tablet Take 650 mg by mouth every 6 (six) hours as needed.     elvitegravir-cobicistat-emtricitabine-tenofovir (GENVOYA) 150-150-200-10 MG TABS tablet Take 1 tablet by mouth daily with breakfast. 30 tablet 5   ibuprofen (ADVIL) 200 MG tablet Take 400 mg by mouth every 6 (six) hours as needed.     nicotine (NICODERM CQ - DOSED IN MG/24 HOURS) 21 mg/24hr patch PLACE 1 PATCH ONTO THE SKIN DAILY. 28 patch 0   No facility-administered medications prior to visit.     Past Medical History:  Diagnosis Date   Allergy    seasonal   GERD (gastroesophageal  reflux disease)    HIV (human immunodeficiency virus infection) ([place])    Hyperlipidemia    Kidney stone    Substance abuse ([place])      Past Surgical History:  Procedure Laterality Date   APPENDECTOMY     APPENDECTOMY     CHOLECYSTECTOMY     INDUCED ABORTION     TONSILLECTOMY         Review of Systems  Constitutional:  Negative for appetite change, chills, diaphoresis, fatigue, fever and unexpected weight change.  Eyes:        Negative for acute change in vision  Respiratory:  Negative for chest tightness, shortness of breath and wheezing.   Cardiovascular:  Negative for chest pain.  Gastrointestinal:  Negative for diarrhea, nausea and vomiting.  Genitourinary:  Negative for dysuria, pelvic pain and vaginal discharge.  Musculoskeletal:  Negative for neck pain and neck stiffness.  Skin:  Negative for rash.  Neurological:  Negative for seizures, syncope, weakness and headaches.  Hematological:  Negative for adenopathy. Does not bruise/bleed easily.  Psychiatric/Behavioral:  Negative for hallucinations.      Objective:    BP 129/65   Pulse 75   Temp (!) 97.5 F (36.4 C) (Oral)   Wt 171 lb 9.6 oz (77.8 kg)   SpO2 99%   BMI 31.39 kg/m  Nursing note and vital signs reviewed.  Physical Exam Constitutional:      General: She is not  in acute distress.    Appearance: She is well-developed.  Eyes:     Conjunctiva/sclera: Conjunctivae normal.  Cardiovascular:     Rate and Rhythm: Normal rate and regular rhythm.     Heart sounds: Normal heart sounds. No murmur heard.   No friction rub. No gallop.  Pulmonary:     Effort: Pulmonary effort is normal. No respiratory distress.     Breath sounds: Normal breath sounds. No wheezing or rales.  Chest:     Chest wall: No tenderness.  Abdominal:     General: Bowel sounds are normal.     Palpations: Abdomen is soft.     Tenderness: There is no abdominal tenderness.  Musculoskeletal:     Cervical back: Neck supple.   Lymphadenopathy:     Cervical: No cervical adenopathy.  Skin:    General: Skin is warm and dry.     Findings: No rash.  Neurological:     Mental Status: She is alert and oriented to person, place, and time.  Psychiatric:        Behavior: Behavior normal.        Thought Content: Thought content normal.        Judgment: Judgment normal.     Depression screen [facility] 2/9 11/02/2020 04/18/2020 02/12/2020 03/16/2019 03/10/2018  Decreased Interest 0 0 2 0 0  Down, Depressed, Hopeless 0 0 3 1 0  PHQ - 2 Score 0 0 5 1 0  Altered sleeping - - 3 - -  Tired, decreased energy - - 3 - -  Change in appetite - - 3 - -  Feeling bad or failure about yourself  - - 0 - -  Trouble concentrating - - 2 - -  Moving slowly or fidgety/restless - - 0 - -  Suicidal thoughts - - 0 - -  PHQ-9 Score - - 16 - -  Difficult doing work/chores - - Not difficult at all - -       Assessment & Plan:    Patient Active Problem List   Diagnosis Date Noted   Scaling eczema 04/18/2020   Generalized anxiety disorder 02/12/2020   Stage 2 hypertension 02/12/2020   Healthcare maintenance 09/04/2018   LGSIL on Pap smear of cervix 11/06/2017   Need for influenza vaccination 03/05/2017   Need for tetanus, diphtheria, and acellular pertussis (Tdap) vaccine 03/05/2017   History of neurosyphilis 05/25/2016   History of syphilis 05/25/2016   Obesity, Class I, BMI 30-34.9 01/20/2016   Immune to hepatitis B 01/16/2016   Neuropathy 01/10/2016   Pure hypertriglyceridemia 12/30/2015   Hyperlipidemia 10/01/2012   Tobacco abuse 05/31/2011   HERNIATED CERVICAL DISC 12/09/2008   Human immunodeficiency virus (HIV) disease ([place]) 01/13/2007   ALLERGIC RHINITIS 01/13/2007   GERD 01/13/2007     Problem List Items Addressed This Visit   None    I am having [name] maintain her acetaminophen, ibuprofen, Genvoya, and nicotine.   No orders of the defined types were placed in this encounter.    Follow-up: No  follow-ups on file.   Terri Piedra, MSN, FNP-C Nurse Practitioner [facility] for Infectious Disease [place] number: [phone]

## 2020-12-21 NOTE — Assessment & Plan Note (Signed)
[name] has cut down her tobacco use from 1 pack of cigarettes per day to 4 cigarettes/day.  She continues to use the nicotine patch which is helping control her cravings.  We will continue current dose for 1 more month with goal of reducing to next step at the completion of this month or sooner if needed.  Congratulated on this effort.  Continue to monitor.

## 2020-12-21 NOTE — Patient Instructions (Addendum)
Nice to see you.  Continue to take your medication daily  Refills have been sent to the pharmacy.   We will check your lab work today.  Plan for follow up in 3 months or sooner if you.  Please call the Internal Medicine Center to make a Primary Care appointment at [phone]  Let me know when ready to step down on patches - great work!  Have a great day and stay safe!

## 2020-12-21 NOTE — Assessment & Plan Note (Signed)
   Discussed importance of safe sexual practice to reduce risk of STI.  Condoms declined.

## 2020-12-22 LAB — T-HELPER CELL (CD4) - (RCID CLINIC ONLY)
CD4 % Helper T Cell: 21 % — ABNORMAL LOW (ref 33–65)
CD4 T Cell Abs: 500 /uL (ref 400–1790)

## 2020-12-24 LAB — HIV-1 RNA QUANT-NO REFLEX-BLD
HIV 1 RNA Quant: 44 Copies/mL — ABNORMAL HIGH
HIV-1 RNA Quant, Log: 1.65 Log cps/mL — ABNORMAL HIGH

## 2020-12-26 ENCOUNTER — Encounter: Payer: Self-pay | Admitting: Emergency Medicine

## 2020-12-26 ENCOUNTER — Other Ambulatory Visit: Payer: Self-pay

## 2020-12-26 ENCOUNTER — Ambulatory Visit
Admission: EM | Admit: 2020-12-26 | Discharge: 2020-12-26 | Disposition: A | Discharge status: Home / Self Care | Payer: Medicaid Other | Attending: Physician Assistant | Admitting: Physician Assistant

## 2020-12-26 DIAGNOSIS — H109 Unspecified conjunctivitis: Secondary | ICD-10-CM

## 2020-12-26 MED ORDER — TOBRAMYCIN 0.3 % OP SOLN: 1.0000 [drp] | OPHTHALMIC | 0 refills | Status: AC

## 2020-12-26 NOTE — ED Triage Notes (Signed)
Pt here for left eye redness and drainage x 2 days

## 2020-12-26 NOTE — Discharge Instructions (Addendum)
Return if any problems.

## 2020-12-26 NOTE — ED Provider Notes (Signed)
[name]    [name]: [id number] Arrival date & time: 12/26/20  3235      History   Chief Complaint Chief Complaint  Patient presents with   Conjunctivitis    HPI Vanessa Browning Vanessa Browning is a 55 y.o. female.   The history is provided by the patient. No language interpreter was used.  Conjunctivitis This is a new problem. The current episode started yesterday. The problem occurs constantly. The problem has not changed since onset.Nothing aggravates the symptoms. Nothing relieves the symptoms. She has tried nothing for the symptoms. The treatment provided no relief.  Pt complains of redness to her left eye.  Pt volunteers in a day care  Past Medical History:  Diagnosis Date   Allergy    seasonal   GERD (gastroesophageal reflux disease)    HIV (human immunodeficiency virus infection) ([place])    Hyperlipidemia    Kidney stone    Substance abuse [facility])     Patient Active Problem List   Diagnosis Date Noted   Scaling eczema 04/18/2020   Generalized anxiety disorder 02/12/2020   Stage 2 hypertension 02/12/2020   Healthcare maintenance 09/04/2018   LGSIL on Pap smear of cervix 11/06/2017   Need for influenza vaccination 03/05/2017   Need for tetanus, diphtheria, and acellular pertussis (Tdap) vaccine 03/05/2017   History of neurosyphilis 05/25/2016   History of syphilis 05/25/2016   Obesity, Class I, BMI 30-34.9 01/20/2016   Immune to hepatitis B 01/16/2016   Neuropathy 01/10/2016   Pure hypertriglyceridemia 12/30/2015   Hyperlipidemia 10/01/2012   Tobacco abuse 05/31/2011   HERNIATED CERVICAL DISC 12/09/2008   Human immunodeficiency virus (HIV) disease ([place]) 01/13/2007   ALLERGIC RHINITIS 01/13/2007   GERD 01/13/2007    Past Surgical History:  Procedure Laterality Date   APPENDECTOMY     APPENDECTOMY     CHOLECYSTECTOMY     INDUCED ABORTION     TONSILLECTOMY      OB History     Gravida  6   Para  0   Term  0   Preterm  0   AB  1   Living          SAB  0   IAB  0   Ectopic  0   Multiple      Live Births               Home Medications    Prior to Admission medications   Medication Sig Start Date End Date Taking? Authorizing Provider  tobramycin (TOBREX) 0.3 % ophthalmic solution Place 1 drop into the right eye every 4 (four) hours for 10 days. 12/26/20 01/05/21 Yes Fransico Meadow, PA-C  acetaminophen (TYLENOL) 325 MG tablet Take 650 mg by mouth every 6 (six) hours as needed.    [provider]  elvitegravir-cobicistat-emtricitabine-tenofovir (GENVOYA) 150-150-200-10 MG TABS tablet Take 1 tablet by mouth daily with breakfast. 11/02/20   Golden Circle, FNP  ibuprofen (ADVIL) 200 MG tablet Take 400 mg by mouth every 6 (six) hours as needed.    [provider]  nicotine (NICODERM CQ - DOSED IN MG/24 HOURS) 21 mg/24hr patch PLACE 1 PATCH ONTO THE SKIN DAILY. 12/21/20   Golden Circle, FNP    Family History Family History  Problem Relation Age of Onset   Diabetes Brother    Hypertension Brother    Arthritis Mother    Healthy Father    Stomach cancer Neg Hx    Rectal cancer Neg Hx  Colon cancer Neg Hx    Colon polyps Neg Hx     Social History Social History   Tobacco Use   Smoking status: Heavy Smoker    Packs/day: 1.00    Years: 30.00    Pack years: 30.00    Types: Cigarettes   Smokeless tobacco: Never   Tobacco comments:    "cutting back," using patches, non-nicotine vap  Vaping Use   Vaping Use: Former  Substance Use Topics   Alcohol use: Yes    Alcohol/week: 0.0 standard drinks    Comment: socially   Drug use: No    Comment: greater than 35 years ago     Allergies   Dexilant [dexlansoprazole]   Review of Systems Review of Systems  All other systems reviewed and are negative.   Physical Exam Triage Vital Signs ED Triage Vitals  Enc Vitals Group     BP 12/26/20 0920 (!) 142/79     Pulse Rate 12/26/20 0920 76     Resp 12/26/20 0920 18     Temp 12/26/20 0920  97.9 F (36.6 C)     Temp Source 12/26/20 0920 Oral     SpO2 12/26/20 0920 97 %     Weight --      Height --      Head Circumference --      Peak Flow --      Pain Score 12/26/20 0921 0     Pain Loc --      Pain Edu? --      Excl. in [place]? --    No data found.  Updated Vital Signs BP (!) 142/79 (BP Location: Left Arm)   Pulse 76   Temp 97.9 F (36.6 C) (Oral)   Resp 18   SpO2 97%   Visual Acuity Right Eye Distance:   Left Eye Distance:   Bilateral Distance:    Right Eye Near:   Left Eye Near:    Bilateral Near:     Physical Exam Vitals and nursing note reviewed.  Constitutional:      Appearance: She is well-developed.  HENT:     Head: Normocephalic.  Eyes:     Pupils: Pupils are equal, round, and reactive to light.     Comments: Injected left conjunctiva  Pulmonary:     Effort: Pulmonary effort is normal.  Abdominal:     General: There is no distension.  Musculoskeletal:        General: Normal range of motion.     Cervical back: Normal range of motion.  Neurological:     Mental Status: She is alert and oriented to person, place, and time.     UC Treatments / Results  Labs (all labs ordered are listed, but only abnormal results are displayed) Labs Reviewed - No data to display  EKG   Radiology No results found.  Procedures Procedures (including critical care time)  Medications Ordered in UC Medications - No data to display  Initial Impression / Assessment and Plan / UC Course  I have reviewed the triage vital signs and the nursing notes.  Pertinent labs & imaging results that were available during my care of the patient were reviewed by me and considered in my medical decision making (see chart for details).     MDM:  Pt counseled on infection and tretment Final Clinical Impressions(s) / UC Diagnoses   Final diagnoses:  Conjunctivitis of left eye, unspecified conjunctivitis type     Discharge Instructions  Return if any  problems.    ED Prescriptions     Medication Sig Dispense Auth. Provider   tobramycin (TOBREX) 0.3 % ophthalmic solution Place 1 drop into the right eye every 4 (four) hours for 10 days. 5 mL Fransico Meadow, [place]      PDMP not reviewed this encounter.   Fransico Meadow, [place] 12/26/20 [phone]

## 2021-02-10 ENCOUNTER — Other Ambulatory Visit: Payer: Self-pay

## 2021-02-10 ENCOUNTER — Ambulatory Visit
Admission: EM | Admit: 2021-02-10 | Discharge: 2021-02-10 | Disposition: A | Discharge status: Home / Self Care | Payer: Medicaid Other | Attending: Urgent Care | Admitting: Urgent Care

## 2021-02-10 DIAGNOSIS — B20 Human immunodeficiency virus [HIV] disease: Secondary | ICD-10-CM

## 2021-02-10 DIAGNOSIS — F17200 Nicotine dependence, unspecified, uncomplicated: Secondary | ICD-10-CM

## 2021-02-10 DIAGNOSIS — R52 Pain, unspecified: Secondary | ICD-10-CM

## 2021-02-10 DIAGNOSIS — J069 Acute upper respiratory infection, unspecified: Secondary | ICD-10-CM | POA: Diagnosis not present

## 2021-02-10 MED ORDER — ALBUTEROL SULFATE HFA 108 (90 BASE) MCG/ACT IN AERS
1.0000 | INHALATION_SPRAY | Freq: Four times a day (QID) | RESPIRATORY_TRACT | 0 refills | Status: AC | PRN
Start: 2021-02-10 — End: ?

## 2021-02-10 MED ORDER — CETIRIZINE HCL 10 MG PO TABS: 10.0000 mg | ORAL_TABLET | Freq: Every day | ORAL | 0 refills | Status: DC

## 2021-02-10 MED ORDER — PREDNISONE 20 MG PO TABS: ORAL_TABLET | ORAL | 0 refills | Status: DC

## 2021-02-10 MED ORDER — PROMETHAZINE-DM 6.25-15 MG/5ML PO SYRP: 5.0000 mL | ORAL_SOLUTION | Freq: Every evening | ORAL | 0 refills | Status: DC | PRN

## 2021-02-10 NOTE — ED Provider Notes (Signed)
[place]   MRN: [id number] DOB: 1965-08-26  Subjective:   Vanessa Browning is a 55 y.o. female presenting for 6 day history of cough, chest congestion, throat congestion, sinus headaches, body aches, malaise and fatigue. Patient is COVID vaccinated, booster as well.  She has a longstanding history of smoking, is trying to quit currently and is doing quarter pack per day.  She is taking nicotine patches.  Denies fever, chest pain.  Has been using NyQuil, Mucinex and Alka-Seltzer with minimal relief.  No current facility-administered medications for this encounter.  Current Outpatient Medications:    acetaminophen (TYLENOL) 325 MG tablet, Take 650 mg by mouth every 6 (six) hours as needed., Disp: , Rfl:    elvitegravir-cobicistat-emtricitabine-tenofovir (GENVOYA) 150-150-200-10 MG TABS tablet, Take 1 tablet by mouth daily with breakfast., Disp: 30 tablet, Rfl: 5   ibuprofen (ADVIL) 200 MG tablet, Take 400 mg by mouth every 6 (six) hours as needed., Disp: , Rfl:    nicotine (NICODERM CQ - DOSED IN MG/24 HOURS) 21 mg/24hr patch, PLACE 1 PATCH ONTO THE SKIN DAILY., Disp: 28 patch, Rfl: 0   Allergies  Allergen Reactions   Dexilant [Dexlansoprazole]     Hands swelling     Past Medical History:  Diagnosis Date   Allergy    seasonal   GERD (gastroesophageal reflux disease)    HIV (human immunodeficiency virus infection) ([place])    Hyperlipidemia    Kidney stone    Substance abuse ([place])      Past Surgical History:  Procedure Laterality Date   APPENDECTOMY     APPENDECTOMY     CHOLECYSTECTOMY     INDUCED ABORTION     TONSILLECTOMY      Family History  Problem Relation Age of Onset   Diabetes Brother    Hypertension Brother    Arthritis Mother    Healthy Father    Stomach cancer Neg Hx    Rectal cancer Neg Hx    Colon cancer Neg Hx    Colon polyps Neg Hx     Social History   Tobacco Use   Smoking status: Heavy Smoker    Packs/day: 1.00    Years:  30.00    Pack years: 30.00    Types: Cigarettes   Smokeless tobacco: Never   Tobacco comments:    "cutting back," using patches, non-nicotine vap  Vaping Use   Vaping Use: Former  Substance Use Topics   Alcohol use: Yes    Alcohol/week: 0.0 standard drinks    Comment: socially   Drug use: No    Comment: greater than 35 years ago    ROS   Objective:   Vitals: BP 117/69 (BP Location: Left Arm)   Pulse 84   Temp 98.2 F (36.8 C) (Oral)   Resp 18   SpO2 96%   Physical Exam Constitutional:      General: She is not in acute distress.    Appearance: Normal appearance. She is well-developed. She is not ill-appearing, toxic-appearing or diaphoretic.  HENT:     Head: Normocephalic and atraumatic.     Right Ear: Tympanic membrane, ear canal and external ear normal. No drainage or tenderness. No middle ear effusion. Tympanic membrane is not erythematous.     Left Ear: Tympanic membrane, ear canal and external ear normal. No drainage or tenderness.  No middle ear effusion. Tympanic membrane is not erythematous.     Nose: Congestion and rhinorrhea present.     Mouth/Throat:  Mouth: Mucous membranes are moist. No oral lesions.     Pharynx: No pharyngeal swelling, oropharyngeal exudate, posterior oropharyngeal erythema or uvula swelling.     Tonsils: No tonsillar exudate or tonsillar abscesses.  Eyes:     General: No scleral icterus.       Right eye: No discharge.        Left eye: No discharge.     Extraocular Movements: Extraocular movements intact.     Right eye: Normal extraocular motion.     Left eye: Normal extraocular motion.     Conjunctiva/sclera: Conjunctivae normal.     Pupils: Pupils are equal, round, and reactive to light.  Cardiovascular:     Rate and Rhythm: Normal rate and regular rhythm.     Pulses: Normal pulses.     Heart sounds: Normal heart sounds. No murmur heard.   No friction rub. No gallop.  Pulmonary:     Effort: Pulmonary effort is normal. No  respiratory distress.     Breath sounds: Normal breath sounds. No stridor. No wheezing, rhonchi or rales.  Musculoskeletal:     Cervical back: Normal range of motion and neck supple.  Lymphadenopathy:     Cervical: No cervical adenopathy.  Skin:    General: Skin is warm and dry.     Findings: No rash.  Neurological:     General: No focal deficit present.     Mental Status: She is alert and oriented to person, place, and time.  Psychiatric:        Mood and Affect: Mood normal.        Behavior: Behavior normal.        Thought Content: Thought content normal.        Judgment: Judgment normal.    Assessment and Plan :   PDMP not reviewed this encounter.  1. Viral URI with cough   2. Body aches   3. Smoker   4. HIV disease ([place])     In light of her history of tobacco abuse and respiratory symptoms, will use an oral prednisone course.  Otherwise, will manage for viral illness such as viral URI, viral syndrome, viral rhinitis, COVID-19, viral pharyngitis. Counseled patient on nature of COVID-19 including modes of transmission, diagnostic testing, management and supportive care.  Offered scripts for symptomatic relief. COVID 19 and flu testing are pending. Counseled patient on potential for adverse effects with medications prescribed/recommended today, ER and return-to-clinic precautions discussed, patient verbalized understanding.     Jaynee Eagles, [place] 02/10/21 [phone]

## 2021-02-10 NOTE — ED Triage Notes (Signed)
Pt c/o cough and chest congestion, headaches, body aches and chills onset last weekend. Denies nausea and vomiting, diarrhea or constipation, fever. States tried nyquil Hydrologist without relief.

## 2021-02-11 LAB — COVID-19, FLU A+B NAA
Influenza A, NAA: NOT DETECTED
Influenza B, NAA: NOT DETECTED
SARS-CoV-2, NAA: NOT DETECTED

## 2021-02-28 ENCOUNTER — Encounter: Payer: Self-pay | Admitting: Internal Medicine

## 2021-02-28 ENCOUNTER — Other Ambulatory Visit: Payer: Self-pay

## 2021-02-28 ENCOUNTER — Ambulatory Visit: Payer: Medicaid Other | Admitting: Internal Medicine

## 2021-02-28 VITALS — BP 134/69 | HR 87 | Temp 98.1°F | Ht 66.0 in | Wt 171.5 lb

## 2021-02-28 DIAGNOSIS — E785 Hyperlipidemia, unspecified: Secondary | ICD-10-CM

## 2021-02-28 DIAGNOSIS — F411 Generalized anxiety disorder: Secondary | ICD-10-CM | POA: Diagnosis not present

## 2021-02-28 DIAGNOSIS — Z720 Tobacco use: Secondary | ICD-10-CM

## 2021-02-28 DIAGNOSIS — R87612 Low grade squamous intraepithelial lesion on cytologic smear of cervix (LGSIL): Secondary | ICD-10-CM

## 2021-02-28 DIAGNOSIS — J302 Other seasonal allergic rhinitis: Secondary | ICD-10-CM

## 2021-02-28 DIAGNOSIS — B20 Human immunodeficiency virus [HIV] disease: Secondary | ICD-10-CM

## 2021-02-28 DIAGNOSIS — I10 Essential (primary) hypertension: Secondary | ICD-10-CM

## 2021-02-28 DIAGNOSIS — E669 Obesity, unspecified: Secondary | ICD-10-CM

## 2021-02-28 DIAGNOSIS — Z1231 Encounter for screening mammogram for malignant neoplasm of breast: Secondary | ICD-10-CM

## 2021-02-28 DIAGNOSIS — E66811 Obesity, class 1: Secondary | ICD-10-CM

## 2021-02-28 MED ORDER — VARENICLINE TARTRATE 0.5 MG X 11 & 1 MG X 42 PO MISC: ORAL | 0 refills | Status: DC

## 2021-02-28 NOTE — Assessment & Plan Note (Signed)
She had gained a few pounds in 2021 however has lost around 10# and is near what seems to be a baseline weight for her.

## 2021-02-28 NOTE — Progress Notes (Signed)
Office Visit   Patient ID: Vanessa Browning, female    DOB: 1966-04-26, 55 y.o.   MRN: [id number]   PCP: Mitzi Hansen, MD   Subjective:  Vanessa Browning is a 55 y.o. year old female who presents for follow up of chronic medical conditions including hypertension, tobacco use, GAD. Please refer to problem based charting for assessment and plan.   ACTIVE MEDICATIONS   Outpatient Medications Prior to Visit  Medication Sig   albuterol (VENTOLIN HFA) 108 (90 Base) MCG/ACT inhaler Inhale 1-2 puffs into the lungs every 6 (six) hours as needed for wheezing or shortness of breath.   cetirizine (ZYRTEC ALLERGY) 10 MG tablet Take 1 tablet (10 mg total) by mouth daily.   elvitegravir-cobicistat-emtricitabine-tenofovir (GENVOYA) 150-150-200-10 MG TABS tablet Take 1 tablet by mouth daily with breakfast.   nicotine (NICODERM CQ - DOSED IN MG/24 HOURS) 21 mg/24hr patch PLACE 1 PATCH ONTO THE SKIN DAILY.   [DISCONTINUED] acetaminophen (TYLENOL) 325 MG tablet Take 650 mg by mouth every 6 (six) hours as needed.   [DISCONTINUED] ibuprofen (ADVIL) 200 MG tablet Take 400 mg by mouth every 6 (six) hours as needed.   [DISCONTINUED] predniSONE (DELTASONE) 20 MG tablet Take 2 tablets daily with breakfast.   [DISCONTINUED] promethazine-dextromethorphan (PROMETHAZINE-DM) 6.25-15 MG/5ML syrup Take 5 mLs by mouth at bedtime as needed for cough.   No facility-administered medications prior to visit.     Objective:   BP 134/69 (BP Location: Left Arm, Patient Position: Sitting, Cuff Size: Normal)   Pulse 87   Temp 98.1 F (36.7 C) (Oral)   Ht 5\' 6"  (1.676 m)   Wt 171 lb 8 oz (77.8 kg)   SpO2 100%   BMI 27.68 kg/m  Wt Readings from Last 3 Encounters:  02/28/21 171 lb 8 oz (77.8 kg)  12/21/20 171 lb 9.6 oz (77.8 kg)  11/02/20 173 lb (78.5 kg)    BP Readings from Last 3 Encounters:  02/28/21 134/69  02/10/21 117/69  12/26/20 (!) 142/79   General: well appearing female Cardiac: RRR, no LE  edema Pulm: lungs clear throughout  Health Maintenance:   Health Maintenance  Topic Date Due   Zoster Vaccines- Shingrix (1 of 2) Never done   COVID-19 Vaccine (3 - Pfizer risk series) 10/02/2019   PAP SMEAR-Modifier  03/25/2020   MAMMOGRAM  02/24/2021   TETANUS/TDAP  03/06/2027   COLONOSCOPY (Pts 45-43yrs Insurance coverage will need to be confirmed)  05/21/2027   INFLUENZA VACCINE  Completed   Hepatitis C Screening  Completed   HIV Screening  Completed   HPV VACCINES  Aged Out    Assessment & Plan:   Problem List Items Addressed This Visit       Cardiovascular and Mediastinum   Essential hypertension    Interval hx: lisinopril-hctz discontinued by ID last November--unsure of the reason. Blood pressure is reasonable in the office today so I think it is ok to just monitor it for now. Suspect 10# weight loss over the past year has helped.  Assessment: diet controlled hypertension Plan:We will continue to monitor it and plan to resume it if her systolic pressures increase to >127mmHg.       Relevant Medications   rosuvastatin (CRESTOR) 5 MG tablet     Other   Human immunodeficiency virus (HIV) disease ([place]) (Chronic)    Fairly well controlled on genvoya. Follows with ID. Labs from July show CD4 count of 500, viral load detectable. She will follow up with them in October.  Tobacco abuse (Chronic)    Previously pack a day smoker--roughly 30 pack year history. She had been doing good with cutting down and was smoking only about 3 cigarettes a day until recently, when her stress increased related to her mom moving in with her. She still feels strongly that she wants to quit and understands the risk of long term tobacco use. She is using the 21mg  nicotine patch which she feels works fairly well but still is having cravings.  Plan Start chantix. Noted that medicaid will only cover a 6 month supply in a 12 month period Continue nicotine patch 21mg        Hyperlipidemia  (Chronic)    Total cholesterol 231; HDL 47; LDL 157 on labs today Given increased cardiovascular risk with HIV, I would favor starting stain therapy. Reviewed drug interactions with genvoya. Will start crestor 5mg  daily Repeat lipid panel in 6-12 months      Relevant Medications   rosuvastatin (CRESTOR) 5 MG tablet   LGSIL on Pap smear of cervix    We had a lengthy discussion about the importance of following up with GYN for the abnormal history. She admits that she was putting it off but recognizes the importance and is agreeable to returning to visit with them. She would like to defer repeat pap testing today and have it done through GYN. Reviewed the risks associated with HIV and cervical cancer.       Generalized anxiety disorder    She was taken off lexapro by ID last November. She had been doing ok up until recent weeks when her mom moved in with her. She is smoking more because of this but does not feel like she needs to go back on medication or go to counseling at this point. She has some muscle pain in her upper back relation to recent stress so we discussed some conservative management techniques today. She knows to let Vanessa Browning know if her stress worsens to the point where she would like to talk to someone or go back on lexapro.       Obesity, Class I, BMI 30-34.9    She had gained a few pounds in 2021 however has lost around 10# and is near what seems to be a baseline weight for her.       Other Visit Diagnoses     Encounter for screening mammogram for malignant neoplasm of breast    -  Primary   Relevant Orders   MM Digital Screening        Return in about 4 months (around 06/30/2021) for Blood pressure follow up with Dr. Darrick Meigs.   Pt discussed with Dr. Elwanda Brooklyn, MD Internal Medicine Resident PGY-3 [facility] 03/01/2021 1:18 PM

## 2021-02-28 NOTE — Assessment & Plan Note (Addendum)
Total cholesterol 231; HDL 47; LDL 157 on labs today Given increased cardiovascular risk with HIV, I would favor starting stain therapy. Reviewed drug interactions with genvoya.  Will start crestor 5mg  daily  Repeat lipid panel in 6-12 months

## 2021-02-28 NOTE — Assessment & Plan Note (Signed)
She was taken off lexapro by ID last November. She had been doing ok up until recent weeks when her mom moved in with her. She is smoking more because of this but does not feel like she needs to go back on medication or go to counseling at this point. She has some muscle pain in her upper back relation to recent stress so we discussed some conservative management techniques today. She knows to let us know if her stress worsens to the point where she would like to talk to someone or go back on lexapro.

## 2021-02-28 NOTE — Assessment & Plan Note (Addendum)
We had a lengthy discussion about the importance of following up with GYN for the abnormal history. She admits that she was putting it off but recognizes the importance and is agreeable to returning to visit with them. She would like to defer repeat pap testing today and have it done through GYN. Reviewed the risks associated with HIV and cervical cancer.

## 2021-02-28 NOTE — Assessment & Plan Note (Addendum)
Interval hx: lisinopril-hctz discontinued by ID last November--unsure of the reason. Blood pressure is reasonable in the office today so I think it is ok to just monitor it for now. Suspect 10# weight loss over the past year has helped.  Assessment: diet controlled hypertension Plan:We will continue to monitor it and plan to resume it if her systolic pressures increase to >166mmHg.

## 2021-02-28 NOTE — Assessment & Plan Note (Addendum)
Previously pack a day smoker--roughly 30 pack year history. She had been doing good with cutting down and was smoking only about 3 cigarettes a day until recently, when her stress increased related to her mom moving in with her. She still feels strongly that she wants to quit and understands the risk of long term tobacco use. She is using the 21mg  nicotine patch which she feels works fairly well but still is having cravings.  Plan  Start chantix. Noted that medicaid will only cover a 6 month supply in a 12 month period  Continue nicotine patch 21mg 

## 2021-03-01 ENCOUNTER — Telehealth: Payer: Self-pay

## 2021-03-01 LAB — LIPID PANEL
Chol/HDL Ratio: 4.9 ratio — ABNORMAL HIGH (ref 0.0–4.4)
Cholesterol, Total: 231 mg/dL — ABNORMAL HIGH (ref 100–199)
HDL: 47 mg/dL (ref >39)
LDL Chol Calc (NIH): 157 mg/dL — ABNORMAL HIGH (ref 0–99)
Triglycerides: 150 mg/dL — ABNORMAL HIGH (ref 0–149)
VLDL Cholesterol Cal: 27 mg/dL (ref 5–40)

## 2021-03-01 LAB — CMP14 + ANION GAP
ALT: 19 IU/L (ref 0–32)
AST: 19 IU/L (ref 0–40)
Albumin/Globulin Ratio: 1.3 (ref 1.2–2.2)
Albumin: 4.3 g/dL (ref 3.8–4.9)
Alkaline Phosphatase: 132 IU/L — ABNORMAL HIGH (ref 44–121)
Anion Gap: 16 mmol/L (ref 10.0–18.0)
BUN/Creatinine Ratio: 20 (ref 9–23)
BUN: 16 mg/dL (ref 6–24)
Bilirubin Total: <0.2 mg/dL (ref 0.0–1.2)
CO2: 25 mmol/L (ref 20–29)
Calcium: 9.4 mg/dL (ref 8.7–10.2)
Chloride: 101 mmol/L (ref 96–106)
Creatinine, Ser: 0.79 mg/dL (ref 0.57–1.00)
Globulin, Total: 3.2 g/dL (ref 1.5–4.5)
Glucose: 86 mg/dL (ref 65–99)
Potassium: 4.3 mmol/L (ref 3.5–5.2)
Sodium: 142 mmol/L (ref 134–144)
Total Protein: 7.5 g/dL (ref 6.0–8.5)
eGFR: 88 mL/min/{1.73_m2} (ref >59)

## 2021-03-01 MED ORDER — ROSUVASTATIN CALCIUM 5 MG PO TABS: 5.0000 mg | ORAL_TABLET | Freq: Every day | ORAL | 1 refills | Status: DC

## 2021-03-01 NOTE — Assessment & Plan Note (Signed)
Fairly well controlled on genvoya. Follows with ID. Labs from July show CD4 count of 500, viral load detectable. She will follow up with them in October.

## 2021-03-01 NOTE — Telephone Encounter (Signed)
PA for pt VERENICLINE was sent through on cover my meds awaiting approval or denial

## 2021-03-01 NOTE — Telephone Encounter (Signed)
DECISION CAME BACK AS FOLLOWED :   Approvedtoday PA Case: [id number], Status: Approved, Coverage Starts on: 03/01/2021 12:00:00 AM, Coverage Ends on: 03/01/2022 12:00:00 AM.   ( Copied and sent to pharmacy )

## 2021-03-02 NOTE — Progress Notes (Signed)
Internal Medicine Clinic Attending  Case discussed with Dr. Christian  At the time of the visit.  We reviewed the resident's history and exam and pertinent patient test results.  I agree with the assessment, diagnosis, and plan of care documented in the resident's note.  

## 2021-03-23 ENCOUNTER — Ambulatory Visit: Payer: Medicaid Other | Admitting: Family

## 2021-04-10 ENCOUNTER — Ambulatory Visit: Payer: Medicaid Other | Admitting: Family

## 2021-04-10 ENCOUNTER — Other Ambulatory Visit: Payer: Self-pay

## 2021-04-10 ENCOUNTER — Encounter: Payer: Self-pay | Admitting: Family

## 2021-04-10 DIAGNOSIS — Z0000 Encounter for general adult medical examination without abnormal findings: Secondary | ICD-10-CM | POA: Diagnosis present

## 2021-04-10 DIAGNOSIS — Z720 Tobacco use: Secondary | ICD-10-CM | POA: Diagnosis not present

## 2021-04-10 DIAGNOSIS — B20 Human immunodeficiency virus [HIV] disease: Secondary | ICD-10-CM | POA: Diagnosis not present

## 2021-04-10 MED ORDER — GENVOYA 150-150-200-10 MG PO TABS: 1.0000 | ORAL_TABLET | Freq: Every day | ORAL | 5 refills | Status: DC

## 2021-04-10 NOTE — Patient Instructions (Signed)
Nice to see you.  We will check your lab work today.  Continue to take your medication daily as prescribed.  Refills have been sent to the pharmacy.  Plan for follow up in 4 months or sooner if needed with lab work on the same day.  Have a great day and stay safe!  

## 2021-04-10 NOTE — Assessment & Plan Note (Signed)
[name] continues to use Chantix and per her PCP. Continues to have cravings and is working on decreasing the amount she is smoking. Currently in the action phase of change. Encouraged to continue working hard and provided 1-800-Quit Now for resource if needed.

## 2021-04-10 NOTE — Progress Notes (Signed)
Brief Narrative   Patient ID: Vanessa Browning, female    DOB: [birth year], 55 y.o.   MRN: [id number]  [name] is a 55 y/o AA female diagnosed with HIV disease around 65 with risk factor of heterosexual contact. Initial CD4 count and viral load unknown. Genotype fro 03/2013 with K103N resistance. No history of opportunistic infection. Vanessa Browning negative. Previous ART history with Kaletra, Isentress, Truvada and now Genvoya.   Subjective:    Chief Complaint  Patient presents with   HIV Positive/AIDS    HPI:  Vanessa Browning is a 55 y.o. female with HIV disease last seen on 12/21/2020 with well-controlled virus and good adherence and tolerance to her ART regimen of Genvoya.  Viral load was 44 with CD4 count of 500.  Here today for routine follow-up.  [name] continues to take her Genvoya daily as prescribed with no adverse side effects.  Overall feeling well today with no new concerns/complaints.  She is working on quitting smoking. Denies fevers, chills, night sweats, headaches, changes in vision, neck pain/stiffness, nausea, diarrhea, vomiting, lesions or rashes.  [name] has no problems obtaining medication from the pharmacy.  Denies feelings of being down, depressed, or hopeless recently.  No current recreational or illicit drug use with occasional alcohol consumption and working on quitting smoking as previously noted.  Routine dental care up-to-date per recommendations.  Influenza vaccine up-to-date.  Requesting COVID booster.   Allergies  Allergen Reactions   Dexilant [Dexlansoprazole]     Hands swelling       Outpatient Medications Prior to Visit  Medication Sig Dispense Refill   albuterol (VENTOLIN HFA) 108 (90 Base) MCG/ACT inhaler Inhale 1-2 puffs into the lungs every 6 (six) hours as needed for wheezing or shortness of breath. 18 g 0   cetirizine (ZYRTEC ALLERGY) 10 MG tablet Take 1 tablet (10 mg total) by mouth daily. 30 tablet 0    nicotine (NICODERM CQ - DOSED IN MG/24 HOURS) 21 mg/24hr patch PLACE 1 PATCH ONTO THE SKIN DAILY. 28 patch 0   rosuvastatin (CRESTOR) 5 MG tablet Take 1 tablet (5 mg total) by mouth daily. 90 tablet 1   varenicline (CHANTIX STARTING MONTH PAK) 0.5 MG X 11 & 1 MG X 42 tablet Take one 0.5 mg tablet by mouth once daily for 3 days, then increase to one 0.5 mg tablet twice daily for 4 days, then increase to one 1 mg tablet twice daily. 53 tablet 0   elvitegravir-cobicistat-emtricitabine-tenofovir (GENVOYA) 150-150-200-10 MG TABS tablet Take 1 tablet by mouth daily with breakfast. 30 tablet 5   No facility-administered medications prior to visit.     Past Medical History:  Diagnosis Date   Allergy    seasonal   GERD (gastroesophageal reflux disease)    HIV (human immunodeficiency virus infection) ([place])    Hyperlipidemia    Kidney stone    Substance abuse ([place])      Past Surgical History:  Procedure Laterality Date   APPENDECTOMY     APPENDECTOMY     CHOLECYSTECTOMY     INDUCED ABORTION     TONSILLECTOMY        Review of Systems  Constitutional:  Negative for appetite change, chills, diaphoresis, fatigue, fever and unexpected weight change.  Eyes:        Negative for acute change in vision  Respiratory:  Negative for chest tightness, shortness of breath and wheezing.   Cardiovascular:  Negative for chest pain.  Gastrointestinal:  Negative for diarrhea, nausea and vomiting.  Genitourinary:  Negative for dysuria, pelvic pain and vaginal discharge.  Musculoskeletal:  Negative for neck pain and neck stiffness.  Skin:  Negative for rash.  Neurological:  Negative for seizures, syncope, weakness and headaches.  Hematological:  Negative for adenopathy. Does not bruise/bleed easily.  Psychiatric/Behavioral:  Negative for hallucinations.      Objective:    BP 131/78   Pulse 72   Temp 97.6 F (36.4 C) (Oral)   Ht 5\' 4"  (1.626 m)   Wt 175 lb (79.4 kg)   SpO2 99%   BMI 30.04 kg/m   Nursing note and vital signs reviewed.  Physical Exam Constitutional:      General: She is not in acute distress.    Appearance: She is well-developed.  Eyes:     Conjunctiva/sclera: Conjunctivae normal.  Cardiovascular:     Rate and Rhythm: Normal rate and regular rhythm.     Heart sounds: Normal heart sounds. No murmur heard.   No friction rub. No gallop.  Pulmonary:     Effort: Pulmonary effort is normal. No respiratory distress.     Breath sounds: Normal breath sounds. No wheezing or rales.  Chest:     Chest wall: No tenderness.  Abdominal:     General: Bowel sounds are normal.     Palpations: Abdomen is soft.     Tenderness: There is no abdominal tenderness.  Musculoskeletal:     Cervical back: Neck supple.  Lymphadenopathy:     Cervical: No cervical adenopathy.  Skin:    General: Skin is warm and dry.     Findings: No rash.  Neurological:     Mental Status: She is alert and oriented to person, place, and time.  Psychiatric:        Behavior: Behavior normal.        Thought Content: Thought content normal.        Judgment: Judgment normal.     Depression screen [facility] 2/9 04/10/2021 02/28/2021 11/02/2020 04/18/2020 02/12/2020  Decreased Interest 0 0 0 0 2  Down, Depressed, Hopeless 0 0 0 0 3  PHQ - 2 Score 0 0 0 0 5  Altered sleeping - 0 - - 3  Tired, decreased energy - 0 - - 3  Change in appetite - 0 - - 3  Feeling bad or failure about yourself  - 0 - - 0  Trouble concentrating - 0 - - 2  Moving slowly or fidgety/restless - 0 - - 0  Suicidal thoughts - 0 - - 0  PHQ-9 Score - 0 - - 16  Difficult doing work/chores - Not difficult at all - - Not difficult at all       Assessment & Plan:    Patient Active Problem List   Diagnosis Date Noted   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 continues to have well-controlled virus with good adherence and tolerance to  her ART regimen of Genvoya.  No signs/symptoms of opportunistic infection.  We reviewed previous lab work and discussed plan of care.  Check blood work today.  Continue current dose of Genvoya.  Plan for follow-up in 4 months or sooner if needed with lab work on the same day.      Relevant Medications   elvitegravir-cobicistat-emtricitabine-tenofovir (GENVOYA) 150-150-200-10 MG TABS tablet   Other Relevant Orders   HIV-1 RNA quant-no reflex-bld   T-helper cell (CD4)- ([facility] clinic only)   Tobacco abuse (Chronic)    [name] continues to use Chantix and per her PCP. Continues to have cravings and is working on decreasing the amount she is smoking. Currently in the action phase of change. Encouraged to continue working hard and provided 1-800-Quit Now for resource if needed.       Healthcare maintenance    Discussed importance of safe sexual practice and condom usage.  Condoms offered and declined COVID booster updated. Routine dental care up-to-date per recommendations.        I am having Vanessa Browning maintain her nicotine, cetirizine, albuterol, varenicline, rosuvastatin, and Genvoya.   Meds ordered this encounter  Medications   elvitegravir-cobicistat-emtricitabine-tenofovir (GENVOYA) 150-150-200-10 MG TABS tablet    Sig: Take 1 tablet by mouth daily with breakfast.    Dispense:  30 tablet    Refill:  5    Order Specific Question:   Supervising Provider    Answer:   Carlyle Basques [[id number]]     Follow-up: Return in about 4  months (around 08/08/2021), or if symptoms worsen or fail to improve.   Terri Piedra, MSN, FNP-C Nurse Practitioner [facility] [place] number: [phone]

## 2021-04-10 NOTE — Assessment & Plan Note (Signed)
   Discussed importance of safe sexual practice and condom usage.  Condoms offered and declined  COVID booster updated.  Routine dental care up-to-date per recommendations.

## 2021-04-10 NOTE — Assessment & Plan Note (Signed)
[name] continues to have well-controlled virus with good adherence and tolerance to her ART regimen of Genvoya.  No signs/symptoms of opportunistic infection.  We reviewed previous lab work and discussed plan of care.  Check blood work today.  Continue current dose of Genvoya.  Plan for follow-up in 4 months or sooner if needed with lab work on the same day.

## 2021-04-11 LAB — T-HELPER CELL (CD4) - (RCID CLINIC ONLY)
CD4 % Helper T Cell: 23 % — ABNORMAL LOW (ref 33–65)
CD4 T Cell Abs: 481 /uL (ref 400–1790)

## 2021-04-13 LAB — HIV-1 RNA QUANT-NO REFLEX-BLD
HIV 1 RNA Quant: 60 Copies/mL — ABNORMAL HIGH
HIV-1 RNA Quant, Log: 1.78 Log cps/mL — ABNORMAL HIGH

## 2021-04-14 ENCOUNTER — Other Ambulatory Visit: Payer: Self-pay

## 2021-04-14 ENCOUNTER — Ambulatory Visit: Payer: Medicaid Other

## 2021-06-01 ENCOUNTER — Ambulatory Visit
Admission: EM | Admit: 2021-06-01 | Discharge: 2021-06-01 | Disposition: A | Discharge status: Home / Self Care | Payer: Medicaid Other | Attending: Internal Medicine | Admitting: Internal Medicine

## 2021-06-01 ENCOUNTER — Encounter: Payer: Self-pay | Admitting: Emergency Medicine

## 2021-06-01 ENCOUNTER — Other Ambulatory Visit: Payer: Self-pay

## 2021-06-01 DIAGNOSIS — M545 Low back pain, unspecified: Secondary | ICD-10-CM | POA: Diagnosis not present

## 2021-06-01 LAB — POCT URINALYSIS DIP (MANUAL ENTRY)
Bilirubin, UA: NEGATIVE
Blood, UA: NEGATIVE
Glucose, UA: NEGATIVE mg/dL
Ketones, POC UA: NEGATIVE mg/dL
Leukocytes, UA: NEGATIVE
Nitrite, UA: NEGATIVE
Protein Ur, POC: NEGATIVE mg/dL
Spec Grav, UA: 1.025 (ref 1.010–1.025)
Urobilinogen, UA: 0.2 E.U./dL
pH, UA: 6.5 (ref 5.0–8.0)

## 2021-06-01 MED ORDER — PREDNISONE 20 MG PO TABS: 40.0000 mg | ORAL_TABLET | Freq: Every day | ORAL | 0 refills | Status: AC

## 2021-06-01 NOTE — Discharge Instructions (Signed)
You have been prescribed a steroid to decrease inflammation and help with your back pain.  Please alternate ice and heat application to affected area as well.  Follow-up at the hospital if symptoms persist or worsen.  Please go to [facility] imaging today to have x-ray of your back completed as well.

## 2021-06-01 NOTE — ED Triage Notes (Signed)
Intermittent back pain treating with muscle relaxers and OTC pain and sleep meds without improvement. Denies previous injury to area. Lower back pain

## 2021-06-01 NOTE — ED Provider Notes (Signed)
[name]    [name]: [id number] Arrival date & time: 06/01/21  1349      History   Chief Complaint Chief Complaint  Patient presents with   Back Pain    HPI Vanessa Browning is a 55 y.o. female.   Patient presents with bilateral lower back pain that has been present for multiple days. Denies any apparent injury.  Pain does not radiate down legs but does radiate around to left side of abdomen.  Denies urinary burning, urinary frequency, hematuria, vaginal discharge, fever.  Pain has been intermittent over the past few days but she reports that she has had an issue with chronic back pain.  She has taken muscle relaxers and over-the-counter pain relievers with no improvement in pain.  Denies any urinary or bowel incontinence or saddle anesthesia.  Denies any numbness or tingling.  Movement exacerbates pain.   Back Pain  Past Medical History:  Diagnosis Date   Allergy    seasonal   GERD (gastroesophageal reflux disease)    HIV (human immunodeficiency virus infection) ([place])    Hyperlipidemia    Kidney stone    Substance abuse [facility])     Patient Active Problem List   Diagnosis Date Noted   Scaling eczema 04/18/2020   Generalized anxiety disorder 02/12/2020   Essential hypertension 02/12/2020   Healthcare maintenance 09/04/2018   LGSIL on Pap smear of cervix 11/06/2017   History of syphilis 05/25/2016   Obesity, Class I, BMI 30-34.9 01/20/2016   Hyperlipidemia 10/01/2012   Tobacco abuse 05/31/2011   Cervical herniated disc 12/09/2008   Human immunodeficiency virus (HIV) disease ([place]) 01/13/2007   Allergic rhinitis 01/13/2007    Past Surgical History:  Procedure Laterality Date   APPENDECTOMY     APPENDECTOMY     CHOLECYSTECTOMY     INDUCED ABORTION     TONSILLECTOMY      OB History     Gravida  6   Para  0   Term  0   Preterm  0   AB  1   Living         SAB  0   IAB  0   Ectopic  0   Multiple      Live Births                Home Medications    Prior to Admission medications   Medication Sig Start Date End Date Taking? Authorizing Provider  predniSONE (DELTASONE) 20 MG tablet Take 2 tablets (40 mg total) by mouth daily for 5 days. 06/01/21 06/06/21 Yes Sada Mazzoni, Michele Rockers, FNP  albuterol (VENTOLIN HFA) 108 (90 Base) MCG/ACT inhaler Inhale 1-2 puffs into the lungs every 6 (six) hours as needed for wheezing or shortness of breath. 02/10/21   Jaynee Eagles, PA-C  cetirizine (ZYRTEC ALLERGY) 10 MG tablet Take 1 tablet (10 mg total) by mouth daily. 02/10/21   Jaynee Eagles, PA-C  elvitegravir-cobicistat-emtricitabine-tenofovir (GENVOYA) 150-150-200-10 MG TABS tablet Take 1 tablet by mouth daily with breakfast. 04/10/21   Golden Circle, FNP  nicotine (NICODERM CQ - DOSED IN MG/24 HOURS) 21 mg/24hr patch PLACE 1 PATCH ONTO THE SKIN DAILY. 12/21/20   Golden Circle, FNP  rosuvastatin (CRESTOR) 5 MG tablet Take 1 tablet (5 mg total) by mouth daily. 03/01/21 03/01/22  Mitzi Hansen, MD  varenicline (CHANTIX STARTING MONTH PAK) 0.5 MG X 11 & 1 MG X 42 tablet Take one 0.5 mg tablet by mouth once daily for 3 days, then  increase to one 0.5 mg tablet twice daily for 4 days, then increase to one 1 mg tablet twice daily. 02/28/21   Mitzi Hansen, MD    Family History Family History  Problem Relation Age of Onset   Diabetes Brother    Hypertension Brother    Arthritis Mother    Healthy Father    Stomach cancer Neg Hx    Rectal cancer Neg Hx    Colon cancer Neg Hx    Colon polyps Neg Hx     Social History Social History   Tobacco Use   Smoking status: Every Day    Packs/day: 1.00    Years: 30.00    Pack years: 30.00    Types: Cigarettes   Smokeless tobacco: Never   Tobacco comments:    "cutting back," using patches, non-nicotine vap  Vaping Use   Vaping Use: Former  Substance Use Topics   Alcohol use: Yes    Alcohol/week: 0.0 standard drinks    Comment: socially   Drug use: No    Comment: greater than 35  years ago     Allergies   Dexilant [dexlansoprazole]   Review of Systems Review of Systems Per HPI  Physical Exam Triage Vital Signs ED Triage Vitals  Enc Vitals Group     BP 06/01/21 1437 (!) 180/76     Pulse Rate 06/01/21 1437 87     Resp 06/01/21 1437 16     Temp 06/01/21 1437 97.9 F (36.6 C)     Temp Source 06/01/21 1437 Oral     SpO2 06/01/21 1437 98 %     Weight --      Height --      Head Circumference --      Peak Flow --      Pain Score 06/01/21 1439 10     Pain Loc --      Pain Edu? --      Excl. in [place]? --    No data found.  Updated Vital Signs BP (!) 162/88 (BP Location: Left Arm)    Pulse 87    Temp 97.9 F (36.6 C) (Oral)    Resp 16    SpO2 98%   Visual Acuity Right Eye Distance:   Left Eye Distance:   Bilateral Distance:    Right Eye Near:   Left Eye Near:    Bilateral Near:     Physical Exam Constitutional:      General: She is not in acute distress.    Appearance: Normal appearance. She is not toxic-appearing or diaphoretic.  HENT:     Head: Normocephalic and atraumatic.  Eyes:     Extraocular Movements: Extraocular movements intact.     Conjunctiva/sclera: Conjunctivae normal.  Pulmonary:     Effort: Pulmonary effort is normal.  Abdominal:     General: Bowel sounds are normal. There is no distension.     Palpations: Abdomen is soft.     Tenderness: There is no abdominal tenderness.  Musculoskeletal:     Cervical back: Normal.     Thoracic back: Normal.     Lumbar back: Tenderness present. No swelling or edema.       Back:     Comments: Tenderness to palpation throughout lower lumbar region.  She does have direct spinal tenderness.  No step-off or crepitus noted. Also has tenderness to palpation to left side.   Neurological:     General: No focal deficit present.     Mental Status: She  is alert and oriented to person, place, and time. Mental status is at baseline.     Deep Tendon Reflexes: Reflexes are normal and symmetric.   Psychiatric:        Mood and Affect: Mood normal.        Behavior: Behavior normal.        Thought Content: Thought content normal.        Judgment: Judgment normal.     UC Treatments / Results  Labs (all labs ordered are listed, but only abnormal results are displayed) Labs Reviewed  POCT URINALYSIS DIP (MANUAL ENTRY)    EKG   Radiology No results found.  Procedures Procedures (including critical care time)  Medications Ordered in UC Medications - No data to display  Initial Impression / Assessment and Plan / UC Course  I have reviewed the triage vital signs and the nursing notes.  Pertinent labs & imaging results that were available during my care of the patient were reviewed by me and considered in my medical decision making (see chart for details).     Physical exam is consistent with lumbar strain.  Urinalysis negative.  Patient had lumbar spine imaging completed in 2020 that showed degenerative changes which could also be contributing to patient's back pain.  Outpatient imaging of the lumbar spine was ordered at [place].  Will call if any abnormalities and treat if appropriate.  Will prescribe prednisone x5 days due to pain being refractory to muscle relaxer and over-the-counter pain relievers.  Prednisone is safe as patient is compliant with HIV medication and last blood work was normal.  Alternate ice and heat application to affected area as well.  Advised patient to follow-up with urgent care or emergency department if symptoms persist or worsen.  Patient had elevated blood pressure reading in urgent care that had recovered slightly to 646 systolic before discharge.  Suspect that pain is contributing to blood pressure.  Discussed  return precautions.  Patient verbalized understanding and was agreeable with plan. Final Clinical Impressions(s) / UC Diagnoses   Final diagnoses:  Acute bilateral low back pain without sciatica     Discharge Instructions       You have been prescribed a steroid to decrease inflammation and help with your back pain.  Please alternate ice and heat application to affected area as well.  Follow-up at the hospital if symptoms persist or worsen.  Please go to [facility] imaging today to have x-ray of your back completed as well.     ED Prescriptions     Medication Sig Dispense Auth. Provider   predniSONE (DELTASONE) 20 MG tablet Take 2 tablets (40 mg total) by mouth daily for 5 days. 10 tablet Teodora Medici, [place]      PDMP not reviewed this encounter.   Teodora Medici, [place] 06/01/21 [phone]

## 2021-07-24 ENCOUNTER — Encounter: Payer: Self-pay | Admitting: Student

## 2021-07-24 ENCOUNTER — Ambulatory Visit: Payer: Medicaid Other | Admitting: Student

## 2021-07-24 ENCOUNTER — Other Ambulatory Visit: Payer: Self-pay

## 2021-07-24 ENCOUNTER — Other Ambulatory Visit (HOSPITAL_COMMUNITY)
Admission: RE | Admit: 2021-07-24 | Discharge: 2021-07-24 | Disposition: A | Discharge status: Home / Self Care | Payer: Medicaid Other | Source: Ambulatory Visit | Attending: Internal Medicine | Admitting: Internal Medicine

## 2021-07-24 VITALS — BP 164/85 | HR 85 | Temp 98.0°F | Ht 64.0 in | Wt 178.2 lb

## 2021-07-24 DIAGNOSIS — Z124 Encounter for screening for malignant neoplasm of cervix: Secondary | ICD-10-CM

## 2021-07-24 DIAGNOSIS — R87612 Low grade squamous intraepithelial lesion on cytologic smear of cervix (LGSIL): Secondary | ICD-10-CM

## 2021-07-24 DIAGNOSIS — I10 Essential (primary) hypertension: Secondary | ICD-10-CM | POA: Diagnosis not present

## 2021-07-24 DIAGNOSIS — G8929 Other chronic pain: Secondary | ICD-10-CM | POA: Diagnosis not present

## 2021-07-24 DIAGNOSIS — M545 Low back pain, unspecified: Secondary | ICD-10-CM

## 2021-07-24 MED ORDER — LISINOPRIL 10 MG PO TABS: 10.0000 mg | ORAL_TABLET | Freq: Every day | ORAL | 1 refills | Status: DC

## 2021-07-24 NOTE — Assessment & Plan Note (Signed)
Vitals:   07/24/21 1019 07/24/21 1106  BP: (!) 157/84 (!) 164/85   Patient with history of hypertension, previously on lisinopril-HCTZ.  Medication was discontinued in November 2021 by ID.  Blood pressure elevated even on repeat today.  Discussed need to better control hypertension, which patient agrees with.  We will restart patient on lisinopril alone at a lower dose (10 mg) as it has been 2 years since medication cessation.  Advised patient to follow-up in 1 month and to keep a blood pressure log at home in the meantime.   We will need to obtain a BMP at next visit and can adjust medication dosage as needed.  Plan: -Start lisinopril 10 mg daily -Encouraged exercise greater than 150 minutes/week and educated on DASH diet -Blood pressure log at home -Follow-up in 1 month for titration of lisinopril as needed and for BMP

## 2021-07-24 NOTE — Assessment & Plan Note (Signed)
Pap smear with HPV cotesting performed today.  Follow-up results

## 2021-07-24 NOTE — Patient Instructions (Signed)
[name],  It was a pleasure seeing you in the clinic today.   For your low back pain, please use heating pad as needed. Also, please try exercising and doing stretches to help. Alternating between tylenol and ibuprofen as needed can also help your back pain. Please let us know if your back pain worsens or does not improve over the next couple of weeks. You got your pap smear done today. Your blood pressure is high today. I have prescribed you lisinopril to help control your blood pressure. Please come back in 1 month.  Please call our clinic at [phone] if you have any questions or concerns. The best time to call is Monday-Friday from 9am-4pm, but there is someone available 24/7 at the same number. If you need medication refills, please notify your pharmacy one week in advance and they will send [place] a request.   Thank you for letting us take part in your care. We look forward to seeing you next time!

## 2021-07-24 NOTE — Progress Notes (Signed)
° °  CC: low back pain  HPI:  [name] is a 56 y.o. female with history listed below presenting to the [facility] for low back pain. Please see individualized problem based charting for full HPI.  Past Medical History:  Diagnosis Date   Allergy    seasonal   GERD (gastroesophageal reflux disease)    HIV (human immunodeficiency virus infection) ([place])    Hyperlipidemia    Kidney stone    Substance abuse ([place])     Review of Systems:  Negative aside from that listed in individualized problem based charting.  Physical Exam:  Vitals:   07/24/21 1019 07/24/21 1106  BP: (!) 157/84 (!) 164/85  Pulse: 83 85  Temp: 98 F (36.7 C)   TempSrc: Oral   SpO2: 100%   Weight: 178 lb 3.2 oz (80.8 kg)   Height: 5\' 4"  (1.626 m)    Physical Exam Constitutional:      Appearance: She is obese. She is not ill-appearing.  HENT:     Mouth/Throat:     Mouth: Mucous membranes are moist.     Pharynx: Oropharynx is clear. No oropharyngeal exudate.  Eyes:     Extraocular Movements: Extraocular movements intact.     Conjunctiva/sclera: Conjunctivae normal.     Pupils: Pupils are equal, round, and reactive to light.  Cardiovascular:     Rate and Rhythm: Normal rate and regular rhythm.     Pulses: Normal pulses.     Heart sounds: Normal heart sounds. No murmur heard.   No gallop.  Pulmonary:     Effort: Pulmonary effort is normal.     Breath sounds: Normal breath sounds. No wheezing, rhonchi or rales.  Abdominal:     General: Bowel sounds are normal. There is no distension.     Palpations: Abdomen is soft.     Tenderness: There is no abdominal tenderness.  Musculoskeletal:     Comments: Spinal tenderness in sacroiliac region, with associated paraspinal radiation. Straight leg test negative bilaterally.   Skin:    General: Skin is warm and dry.  Neurological:     General: No focal deficit present.     Mental Status: She is alert and oriented to person, place, and time.      Comments: Normal gait. Sensation and muscle strength intact bilaterally.  Psychiatric:        Mood and Affect: Mood normal.        Behavior: Behavior normal.     Assessment & Plan:   See Encounters Tab for problem based charting.  Patient discussed with Dr.  Jimmye Norman
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
## 2021-07-27 LAB — CYTOLOGY - PAP
Adequacy: ABSENT
Diagnosis: NEGATIVE

## 2021-07-28 NOTE — Progress Notes (Signed)
Internal Medicine Clinic Attending  Case discussed with Dr. Jinwala  At the time of the visit.  We reviewed the resident's history and exam and pertinent patient test results.  I agree with the assessment, diagnosis, and plan of care documented in the resident's note.  

## 2021-07-31 ENCOUNTER — Encounter: Payer: Self-pay | Admitting: Family

## 2021-07-31 ENCOUNTER — Ambulatory Visit: Payer: Medicaid Other | Admitting: Family

## 2021-07-31 ENCOUNTER — Other Ambulatory Visit: Payer: Self-pay

## 2021-07-31 VITALS — BP 150/88 | HR 73 | Temp 97.7°F | Resp 16 | Wt 176.6 lb

## 2021-07-31 DIAGNOSIS — B20 Human immunodeficiency virus [HIV] disease: Secondary | ICD-10-CM

## 2021-07-31 DIAGNOSIS — M545 Low back pain, unspecified: Secondary | ICD-10-CM

## 2021-07-31 DIAGNOSIS — Z0000 Encounter for general adult medical examination without abnormal findings: Secondary | ICD-10-CM

## 2021-07-31 DIAGNOSIS — G8929 Other chronic pain: Secondary | ICD-10-CM

## 2021-07-31 MED ORDER — GENVOYA 150-150-200-10 MG PO TABS: 1.0000 | ORAL_TABLET | Freq: Every day | ORAL | 6 refills | Status: DC

## 2021-07-31 NOTE — Assessment & Plan Note (Signed)
[name] has chronic low back pain and occasional issues with completing instrumental and routine activities of daily living.  Appears reasonable for accommodations for secondary bedroom to allow for friends/family members to assist her with these activities as needed.  Form completed and copy placed in media section.

## 2021-07-31 NOTE — Progress Notes (Signed)
Brief Narrative   Patient ID: Vanessa Browning, female    DOB: 12-05-1965, 56 y.o.   MRN: [id number]  Vanessa Browning is a 56 y/o AA female diagnosed with HIV disease around 97 with risk factor of heterosexual contact. Initial CD4 count and viral load unknown. Genotype fro 03/2013 with K103N resistance. No history of opportunistic infection. [id number] negative. Previous ART history with Kaletra, Isentress, Truvada and now Genvoya.   Subjective:    Chief Complaint  Patient presents with   Follow-up    B20    HPI:  Vanessa Browning is a 56 y.o. female who is HIV disease last seen on 04/10/2021 with well-controlled virus and good adherence and tolerance to her ART regimen of Genvoya.  Viral load was 60 with CD4 count of 481.  Here today for routine follow-up.  Vanessa Browning continues to take her Genvoya daily as prescribed with no adverse side effects or missed doses since her last office visit.  Overall feeling well today with no new concerns/complaints.  She does have questions about her blood pressure and potential need for blood pressure medication. Denies fevers, chills, night sweats, headaches, changes in vision, neck pain/stiffness, nausea, diarrhea, vomiting, lesions or rashes.  Vanessa Browning has no problem obtaining medication from the pharmacy remains covered by Medicaid.  Denies feelings of being down, depressed, or hopeless recently.  Drinks alcohol socially with no current recreational illicit drug use and continued tobacco use.  Condoms offered.  Vaccinations are up-to-date per recommendations.  Requesting form completion for specialty housing secondary to challenges performing activities of daily living and instrumental activities of daily living which are impacted by her chronic neck and low back pain.  Requesting assistance in housing to allow for friends/family members to be able to stay and help her with these activities.   Allergies  Allergen Reactions   Dexilant  [Dexlansoprazole]     Hands swelling       Outpatient Medications Prior to Visit  Medication Sig Dispense Refill   albuterol (VENTOLIN HFA) 108 (90 Base) MCG/ACT inhaler Inhale 1-2 puffs into the lungs every 6 (six) hours as needed for wheezing or shortness of breath. 18 g 0   lisinopril (ZESTRIL) 10 MG tablet Take 1 tablet (10 mg total) by mouth daily. 30 tablet 1   rosuvastatin (CRESTOR) 5 MG tablet Take 1 tablet (5 mg total) by mouth daily. 90 tablet 1   elvitegravir-cobicistat-emtricitabine-tenofovir (GENVOYA) 150-150-200-10 MG TABS tablet Take 1 tablet by mouth daily with breakfast. 30 tablet 5   cetirizine (ZYRTEC ALLERGY) 10 MG tablet Take 1 tablet (10 mg total) by mouth daily. (Patient not taking: Reported on 07/31/2021) 30 tablet 0   nicotine (NICODERM CQ - DOSED IN MG/24 HOURS) 21 mg/24hr patch PLACE 1 PATCH ONTO THE SKIN DAILY. (Patient not taking: Reported on 07/31/2021) 28 patch 0   varenicline (CHANTIX STARTING MONTH PAK) 0.5 MG X 11 & 1 MG X 42 tablet Take one 0.5 mg tablet by mouth once daily for 3 days, then increase to one 0.5 mg tablet twice daily for 4 days, then increase to one 1 mg tablet twice daily. (Patient not taking: Reported on 07/31/2021) 53 tablet 0   No facility-administered medications prior to visit.     Past Medical History:  Diagnosis Date   Allergy    seasonal   GERD (gastroesophageal reflux disease)    HIV (human immunodeficiency virus infection) ([place])    Hyperlipidemia    Kidney stone  Substance abuse ([place])      Past Surgical History:  Procedure Laterality Date   APPENDECTOMY     APPENDECTOMY     CHOLECYSTECTOMY     INDUCED ABORTION     TONSILLECTOMY        Review of Systems  Constitutional:  Negative for appetite change, chills, diaphoresis, fatigue, fever and unexpected weight change.  Eyes:        Negative for acute change in vision  Respiratory:  Negative for chest tightness, shortness of breath and wheezing.   Cardiovascular:   Negative for chest pain.  Gastrointestinal:  Negative for diarrhea, nausea and vomiting.  Genitourinary:  Negative for dysuria, pelvic pain and vaginal discharge.  Musculoskeletal:  Positive for back pain and neck pain. Negative for neck stiffness.  Skin:  Negative for rash.  Neurological:  Negative for seizures, syncope, weakness and headaches.  Hematological:  Negative for adenopathy. Does not bruise/bleed easily.  Psychiatric/Behavioral:  Negative for hallucinations.      Objective:    BP (!) 150/88    Pulse 73    Temp 97.7 F (36.5 C) (Oral)    Resp 16    Wt 176 lb 9.6 oz (80.1 kg)    SpO2 98%    BMI 30.31 kg/m  Nursing note and vital signs reviewed.  Physical Exam Constitutional:      General: She is not in acute distress.    Appearance: She is well-developed.  Cardiovascular:     Rate and Rhythm: Normal rate and regular rhythm.     Heart sounds: Normal heart sounds.  Pulmonary:     Effort: Pulmonary effort is normal.     Breath sounds: Normal breath sounds.  Skin:    General: Skin is warm and dry.  Neurological:     Mental Status: She is alert and oriented to person, place, and time.  Psychiatric:        Mood and Affect: Mood normal.     Depression screen [facility] 2/9 07/31/2021 07/24/2021 04/10/2021 02/28/2021 11/02/2020  Decreased Interest 0 0 0 0 0  Down, Depressed, Hopeless 0 0 0 0 0  PHQ - 2 Score 0 0 0 0 0  Altered sleeping - 0 - 0 -  Tired, decreased energy - 0 - 0 -  Change in appetite - 0 - 0 -  Feeling bad or failure about yourself  - 0 - 0 -  Trouble concentrating - 0 - 0 -  Moving slowly or fidgety/restless - 0 - 0 -  Suicidal thoughts - 0 - 0 -  PHQ-9 Score - 0 - 0 -  Difficult doing work/chores - Not difficult at all - Not difficult at all -       Assessment & Plan:    Patient Active Problem List   Diagnosis Date Noted   Chronic low back pain without sciatica 07/24/2021   Scaling eczema 04/18/2020   Generalized anxiety disorder 02/12/2020    Essential hypertension 02/12/2020   Healthcare maintenance 09/04/2018   LGSIL on Pap smear of cervix 11/06/2017   History of syphilis 05/25/2016   Obesity, Class I, BMI 30-34.9 01/20/2016   Hyperlipidemia 10/01/2012   Tobacco abuse 05/31/2011   Cervical herniated disc 12/09/2008   Human immunodeficiency virus (HIV) disease ([place]) 01/13/2007   Allergic rhinitis 01/13/2007     Problem List Items Addressed This Visit       Other   Human immunodeficiency virus (HIV) disease ([place]) (Chronic)    Vanessa Browning has well-controlled virus  with good adherence and tolerance to her ART regimen of Genvoya.  No signs/symptoms of opportunistic infection.  We reviewed previous lab work and discussed plan of care.  Check blood work today.  Continue current dose of Genvoya.  Plan for follow-up in 4 months or sooner if needed with lab work on the same day.      Relevant Medications   elvitegravir-cobicistat-emtricitabine-tenofovir (GENVOYA) 150-150-200-10 MG TABS tablet   Other Relevant Orders   T-helper cells (CD4) count (not at [facility])   Comprehensive metabolic panel   HIV-1 RNA quant-no reflex-bld   Healthcare maintenance - Primary    Discussed importance of safe sexual practices and condom use.  Condoms offered. Routine vaccinations up-to-date per recommendations. Encouraged to complete routine dental care.  Can refer to [facility] if needed.       Chronic low back pain without sciatica    Vanessa Browning has chronic low back pain and occasional issues with completing instrumental and routine activities of daily living.  Appears reasonable for accommodations for secondary bedroom to allow for friends/family members to assist her with these activities as needed.  Form completed and copy placed in media section.        I am having Tonimarie Gritz Browning maintain her nicotine, cetirizine, albuterol, varenicline, rosuvastatin, lisinopril, and Genvoya.   Meds ordered this encounter  Medications    elvitegravir-cobicistat-emtricitabine-tenofovir (GENVOYA) 150-150-200-10 MG TABS tablet    Sig: Take 1 tablet by mouth daily with breakfast.    Dispense:  30 tablet    Refill:  6    Order Specific Question:   Supervising Provider    Answer:   Carlyle Basques [[id number]]     Follow-up: Return in about 4 months (around 11/28/2021), or if symptoms worsen or fail to improve.   Terri Piedra, MSN, FNP-C Nurse Practitioner [facility] [place] number: [phone]

## 2021-07-31 NOTE — Assessment & Plan Note (Signed)
[name] has well-controlled virus with good adherence and tolerance to her ART regimen of Genvoya.  No signs/symptoms of opportunistic infection.  We reviewed previous lab work and discussed plan of care.  Check blood work today.  Continue current dose of Genvoya.  Plan for follow-up in 4 months or sooner if needed with lab work on the same day.

## 2021-07-31 NOTE — Patient Instructions (Addendum)
Nice to see you.  We will check your lab work today.  Continue to take your medication daily as prescribed.  Refills have been sent to the pharmacy.  Plan for follow up in 4 months or sooner if needed with lab work on the same day.  Have a great day and stay safe!  

## 2021-07-31 NOTE — Assessment & Plan Note (Signed)
·   Discussed importance of safe sexual practices and condom use.  Condoms offered.  Routine vaccinations up-to-date per recommendations.  Encouraged to complete routine dental care.  Can refer to [facility] if needed.

## 2021-08-02 LAB — T-HELPER CELLS (CD4) COUNT (NOT AT ARMC)
Absolute CD4: 655 cells/uL (ref 490–1740)
CD4 T Helper %: 24 % — ABNORMAL LOW (ref 30–61)
Total lymphocyte count: 2783 cells/uL (ref 850–3900)

## 2021-08-02 LAB — COMPREHENSIVE METABOLIC PANEL
AG Ratio: 1.2 (calc) (ref 1.0–2.5)
ALT: 18 U/L (ref 6–29)
AST: 20 U/L (ref 10–35)
Albumin: 4.4 g/dL (ref 3.6–5.1)
Alkaline phosphatase (APISO): 103 U/L (ref 37–153)
BUN: 13 mg/dL (ref 7–25)
CO2: 30 mmol/L (ref 20–32)
Calcium: 9.9 mg/dL (ref 8.6–10.4)
Chloride: 104 mmol/L (ref 98–110)
Creat: 0.7 mg/dL (ref 0.50–1.03)
Globulin: 3.7 g/dL (calc) (ref 1.9–3.7)
Glucose, Bld: 89 mg/dL (ref 65–99)
Potassium: 4.1 mmol/L (ref 3.5–5.3)
Sodium: 141 mmol/L (ref 135–146)
Total Bilirubin: 0.4 mg/dL (ref 0.2–1.2)
Total Protein: 8.1 g/dL (ref 6.1–8.1)

## 2021-08-02 LAB — HIV-1 RNA QUANT-NO REFLEX-BLD
HIV 1 RNA Quant: <20 Copies/mL — ABNORMAL HIGH
HIV-1 RNA Quant, Log: <1.3 Log cps/mL — ABNORMAL HIGH

## 2021-08-08 ENCOUNTER — Ambulatory Visit: Payer: Medicaid Other | Admitting: Family

## 2021-08-21 ENCOUNTER — Ambulatory Visit: Payer: Medicaid Other | Admitting: Internal Medicine

## 2021-08-21 ENCOUNTER — Encounter: Payer: Self-pay | Admitting: Internal Medicine

## 2021-08-21 ENCOUNTER — Other Ambulatory Visit: Payer: Self-pay | Admitting: Student

## 2021-08-21 ENCOUNTER — Other Ambulatory Visit: Payer: Self-pay

## 2021-08-21 VITALS — BP 135/79 | HR 83 | Temp 98.2°F | Resp 24 | Ht 64.0 in | Wt 176.4 lb

## 2021-08-21 DIAGNOSIS — I10 Essential (primary) hypertension: Secondary | ICD-10-CM | POA: Diagnosis present

## 2021-08-21 MED ORDER — LISINOPRIL 20 MG PO TABS: 20.0000 mg | ORAL_TABLET | Freq: Every day | ORAL | 2 refills | Status: DC

## 2021-08-21 NOTE — Assessment & Plan Note (Signed)
BP: 135/79  ? ?[name] states she has been taking her Lisinopril daily without any difficulty. She denies any adverse effects including SOB or cough.  ? ?She brought in a log at home (please see media tab).  ? ?Assessment/Plan:  ?Blood pressure today in the office is significantly improved. On the other hand, blood pressure at home fluctuates quite a bit. Minimum systolic of 704 and maximum of 170, however average approximately ~140-145. Due to this, will further titrate medication with goal BP < 130/80.  ? ?- Lisinopril increased to 20 mg daily ?- BMP at next visit ?- Follow up in 4-6 weeks  ?

## 2021-08-21 NOTE — Patient Instructions (Addendum)
It was nice seeing you today! Thank you for choosing [facility] for your Primary Care.  ?  ?Today we talked about:  ? ?High blood pressure:  ?We are increasing the Lisinopril dose to 20 mg daily. The new prescription was sent to the pharmacy today ?Continue to check your blood pressure regularly. The log you brought in today was great!!  ?Please call us if you experience any difficulties  ? ? ?Follow up in 4-6 weeks ?

## 2021-08-21 NOTE — Progress Notes (Signed)
? ?  CC: HTN ? ?HPI: ? ?[name] is a 56 y.o. with a PMHx as listed below who presents to the clinic for HTN.  ? ?Please see the Encounters tab for problem-based Assessment & Plan regarding status of patient's acute and chronic conditions. ? ?Past Medical History:  ?Diagnosis Date  ? Allergy   ? seasonal  ? GERD (gastroesophageal reflux disease)   ? HIV (human immunodeficiency virus infection) ([place])   ? Hyperlipidemia   ? Kidney stone   ? Substance abuse ([place])   ? ?Review of Systems: Review of Systems  ?Constitutional:  Negative for chills and fever.  ?Respiratory:  Negative for cough and shortness of breath.   ?Cardiovascular:  Negative for chest pain and leg swelling.  ? ?Physical Exam: ? ?Vitals:  ? 08/21/21 0917  ?BP: 135/79  ?Pulse: 83  ?Resp: (!) 24  ?Temp: 98.2 ?F (36.8 ?C)  ?TempSrc: Oral  ?SpO2: 99%  ?Weight: 176 lb 6.4 oz (80 kg)  ?Height: '5\' 4"'$  (1.626 m)  ? ?Physical Exam ?Vitals and nursing note reviewed.  ?Constitutional:   ?   General: She is not in acute distress. ?   Appearance: She is obese.  ?HENT:  ?   Head: Normocephalic and atraumatic.  ?Eyes:  ?   Conjunctiva/sclera: Conjunctivae normal.  ?   Pupils: Pupils are equal, round, and reactive to light.  ?Pulmonary:  ?   Effort: Pulmonary effort is normal. No respiratory distress.  ?Skin: ?   General: Skin is warm and dry.  ?Neurological:  ?   Mental Status: She is alert and oriented to person, place, and time.  ?   Gait: Gait normal.  ?Psychiatric:     ?   Mood and Affect: Mood normal.     ?   Behavior: Behavior normal.  ? ?Assessment & Plan:  ? ?See Encounters Tab for problem based charting. ? ?Patient discussed with Dr.  Cain Sieve ? ?

## 2021-08-21 NOTE — Progress Notes (Signed)
Internal Medicine Clinic Attending  Case discussed with Dr. Basaraba  At the time of the visit.  We reviewed the resident's history and exam and pertinent patient test results.  I agree with the assessment, diagnosis, and plan of care documented in the resident's note.  

## 2021-09-07 ENCOUNTER — Other Ambulatory Visit: Payer: Self-pay | Admitting: Internal Medicine

## 2021-10-05 NOTE — Progress Notes (Deleted)
   Office Visit   Patient ID: Vanessa Browning, female    DOB: 07/31/1965, 56 y.o.   MRN: [id number]   PCP: Mitzi Hansen, MD   Subjective:    Vanessa Browning is a 56 y.o. year old female who presents for hypertension follow up.  LOV was in March at which time she had noted her home systolic blood pressures averaging in the 140s. Lisinopril was increased to '20mg'$  and she was instructed to follow up in 4-6w.     Objective:   There were no vitals taken for this visit.  Assessment & Plan:   Problem List Items Addressed This Visit   None    No follow-ups on file.   Pt discussed with ***  Mitzi Hansen, MD Internal Medicine Resident PGY-3 [facility] 10/05/2021 7:54 AM

## 2021-10-09 ENCOUNTER — Encounter: Payer: Medicaid Other | Admitting: Internal Medicine

## 2021-10-09 DIAGNOSIS — I10 Essential (primary) hypertension: Secondary | ICD-10-CM

## 2021-10-11 ENCOUNTER — Encounter: Payer: Self-pay | Admitting: Internal Medicine

## 2021-11-28 ENCOUNTER — Ambulatory Visit: Payer: Medicaid Other | Admitting: Family

## 2021-11-29 ENCOUNTER — Encounter: Payer: Self-pay | Admitting: Family

## 2021-11-29 ENCOUNTER — Other Ambulatory Visit: Payer: Self-pay

## 2021-11-29 ENCOUNTER — Ambulatory Visit: Payer: Medicaid Other | Admitting: Family

## 2021-11-29 VITALS — BP 135/86 | HR 70 | Temp 97.6°F | Wt 187.0 lb

## 2021-11-29 DIAGNOSIS — Z79899 Other long term (current) drug therapy: Secondary | ICD-10-CM | POA: Diagnosis not present

## 2021-11-29 DIAGNOSIS — I10 Essential (primary) hypertension: Secondary | ICD-10-CM | POA: Diagnosis not present

## 2021-11-29 DIAGNOSIS — Z720 Tobacco use: Secondary | ICD-10-CM

## 2021-11-29 DIAGNOSIS — B20 Human immunodeficiency virus [HIV] disease: Secondary | ICD-10-CM | POA: Diagnosis present

## 2021-11-29 DIAGNOSIS — Z0000 Encounter for general adult medical examination without abnormal findings: Secondary | ICD-10-CM | POA: Diagnosis not present

## 2021-11-29 MED ORDER — GENVOYA 150-150-200-10 MG PO TABS: 1.0000 | ORAL_TABLET | Freq: Every day | ORAL | 6 refills | Status: DC

## 2021-11-29 MED ORDER — CHANTIX STARTING MONTH PAK 0.5 MG X 11 & 1 MG X 42 PO TBPK: ORAL_TABLET | ORAL | 0 refills | Status: DC

## 2021-11-29 MED ORDER — VARENICLINE TARTRATE 1 MG PO TABS: 1.0000 mg | ORAL_TABLET | Freq: Two times a day (BID) | ORAL | 1 refills | Status: DC

## 2021-11-29 NOTE — Assessment & Plan Note (Signed)
[name] continues to have well-controlled virus with good adherence and tolerance to Genvoya.  Reviewed previous lab work and discussed plan of care.  Check blood work today.  Continue current dose of Genvoya.  Plan for follow-up in 4 months or sooner if needed with lab work on the same day.

## 2021-11-29 NOTE — Assessment & Plan Note (Signed)
Blood pressure adequately controlled with current lifestyle management.  Continue to monitor.

## 2021-11-29 NOTE — Assessment & Plan Note (Signed)
Vanessa Browning continues to smoke and is interested in quitting smoking.  She appears to be in the action stage of change.  Previously prescribed Chantix and would like to try this again.  Provided quitting information including the hotline.  Start Chantix.

## 2021-11-29 NOTE — Assessment & Plan Note (Signed)
   Discussed importance of safe sexual practices and condom use.  Condoms offered.  Due for routine breast cancer screening.  Vaccines up-to-date per recommendations.  Will be due for Shingrix.

## 2021-11-29 NOTE — Progress Notes (Signed)
Brief Narrative   Patient ID: Vanessa Browning, female    DOB: 08/05/1965, 56 y.o.   MRN: [id number]  Ms. Lamia Mariner is a 56 y/o AA female diagnosed with HIV disease around 73 with risk factor of heterosexual contact. Initial CD4 count and viral load unknown. Genotype fro 03/2013 with K103N resistance. No history of opportunistic infection. [id number] negative. Previous ART history with Kaletra, Isentress, Truvada and now Genvoya.  Subjective:    Chief Complaint  Patient presents with   HIV Positive/AIDS    HPI:  Vanessa Browning is a 56 y.o. female with HIV disease last seen on 07/31/2021 with well-controlled virus and good adherence and tolerance to her ART regimen of Genvoya.  Viral load was undetectable with CD4 count of 655.  Kidney function, liver function, electrolytes within normal ranges.  Here today for routine follow-up.  Ms. Aleksia Freiman continues to take her Genvoya daily as prescribed with no adverse side effects.  Feeling well today with no new concerns/complaints.  She is interested in quitting tobacco use and is requesting medication.  Continuing to work on getting herself healthy. Denies fevers, chills, night sweats, headaches, changes in vision, neck pain/stiffness, nausea, diarrhea, vomiting, lesions or rashes.  Ms. Jacquelyne Quarry has no problems obtaining medication from the pharmacy.  Denies feelings of being down, depressed, or hopeless recently.  Continues to drink alcohol socially with no recreational/illicit drug use.  Working on cutting back on smoking and is interested in Chantix.  Condoms offered.  Healthcare maintenance due includes mammogram.   Allergies  Allergen Reactions   Dexilant [Dexlansoprazole]     Hands swelling       Outpatient Medications Prior to Visit  Medication Sig Dispense Refill   albuterol (VENTOLIN HFA) 108 (90 Base) MCG/ACT inhaler Inhale 1-2 puffs into the lungs every 6 (six) hours as needed for wheezing or shortness of  breath. 18 g 0   lisinopril (ZESTRIL) 20 MG tablet Take 1 tablet (20 mg total) by mouth daily. 30 tablet 2   rosuvastatin (CRESTOR) 5 MG tablet TAKE 1 TABLET (5 MG TOTAL) BY MOUTH DAILY. 90 tablet 1   elvitegravir-cobicistat-emtricitabine-tenofovir (GENVOYA) 150-150-200-10 MG TABS tablet Take 1 tablet by mouth daily with breakfast. 30 tablet 6   cetirizine (ZYRTEC ALLERGY) 10 MG tablet Take 1 tablet (10 mg total) by mouth daily. (Patient not taking: Reported on 07/31/2021) 30 tablet 0   nicotine (NICODERM CQ - DOSED IN MG/24 HOURS) 21 mg/24hr patch PLACE 1 PATCH ONTO THE SKIN DAILY. (Patient not taking: Reported on 07/31/2021) 28 patch 0   varenicline (CHANTIX STARTING MONTH PAK) 0.5 MG X 11 & 1 MG X 42 tablet Take one 0.5 mg tablet by mouth once daily for 3 days, then increase to one 0.5 mg tablet twice daily for 4 days, then increase to one 1 mg tablet twice daily. (Patient not taking: Reported on 07/31/2021) 53 tablet 0   No facility-administered medications prior to visit.     Past Medical History:  Diagnosis Date   Allergy    seasonal   GERD (gastroesophageal reflux disease)    HIV (human immunodeficiency virus infection) ([place])    Hyperlipidemia    Kidney stone    Substance abuse ([place])      Past Surgical History:  Procedure Laterality Date   APPENDECTOMY     APPENDECTOMY     CHOLECYSTECTOMY     INDUCED ABORTION     TONSILLECTOMY        Review of  Systems  Constitutional:  Negative for appetite change, chills, diaphoresis, fatigue, fever and unexpected weight change.  Eyes:        Negative for acute change in vision  Respiratory:  Negative for chest tightness, shortness of breath and wheezing.   Cardiovascular:  Negative for chest pain.  Gastrointestinal:  Negative for diarrhea, nausea and vomiting.  Genitourinary:  Negative for dysuria, pelvic pain and vaginal discharge.  Musculoskeletal:  Negative for neck pain and neck stiffness.  Skin:  Negative for rash.   Neurological:  Negative for seizures, syncope, weakness and headaches.  Hematological:  Negative for adenopathy. Does not bruise/bleed easily.  Psychiatric/Behavioral:  Negative for hallucinations.       Objective:    BP 135/86   Pulse 70   Temp 97.6 F (36.4 C) (Oral)   Wt 187 lb (84.8 kg)   SpO2 99%   BMI 32.10 kg/m  Nursing note and vital signs reviewed.  Physical Exam Constitutional:      General: She is not in acute distress.    Appearance: She is well-developed.  Eyes:     Conjunctiva/sclera: Conjunctivae normal.  Cardiovascular:     Rate and Rhythm: Normal rate and regular rhythm.     Heart sounds: Normal heart sounds. No murmur heard.    No friction rub. No gallop.  Pulmonary:     Effort: Pulmonary effort is normal. No respiratory distress.     Breath sounds: Normal breath sounds. No wheezing or rales.  Chest:     Chest wall: No tenderness.  Abdominal:     General: Bowel sounds are normal.     Palpations: Abdomen is soft.     Tenderness: There is no abdominal tenderness.  Musculoskeletal:     Cervical back: Neck supple.  Lymphadenopathy:     Cervical: No cervical adenopathy.  Skin:    General: Skin is warm and dry.     Findings: No rash.  Neurological:     Mental Status: She is alert and oriented to person, place, and time.  Psychiatric:        Behavior: Behavior normal.        Thought Content: Thought content normal.        Judgment: Judgment normal.         08/21/2021    9:16 AM 07/31/2021   11:41 AM 07/24/2021   10:18 AM 04/10/2021    9:54 AM 02/28/2021    2:50 PM  Depression screen PHQ 2/9  Decreased Interest 0 0 0 0 0  Down, Depressed, Hopeless 0 0 0 0 0  PHQ - 2 Score 0 0 0 0 0  Altered sleeping 0  0  0  Tired, decreased energy 0  0  0  Change in appetite 0  0  0  Feeling bad or failure about yourself  0  0  0  Trouble concentrating 0  0  0  Moving slowly or fidgety/restless 0  0  0  Suicidal thoughts 0  0  0  PHQ-9 Score 0  0  0   Difficult doing work/chores Not difficult at all  Not difficult at all  Not difficult at all       Assessment & Plan:    Patient Active Problem List   Diagnosis Date Noted   Chronic low back pain without sciatica 07/24/2021   Generalized anxiety disorder 02/12/2020   Essential hypertension 02/12/2020   Healthcare maintenance 09/04/2018   LGSIL on Pap smear of cervix 11/06/2017  Obesity, Class I, BMI 30-34.9 01/20/2016   Hyperlipidemia 10/01/2012   Tobacco abuse 05/31/2011   Cervical herniated disc 12/09/2008   Human immunodeficiency virus (HIV) disease ([place]) 01/13/2007   Allergic rhinitis 01/13/2007     Problem List Items Addressed This Visit       Cardiovascular and Mediastinum   Essential hypertension    Blood pressure adequately controlled with current lifestyle management.  Continue to monitor.        Other   Human immunodeficiency virus (HIV) disease ([place]) (Chronic)    Ms. Tomeka Kantner continues to have well-controlled virus with good adherence and tolerance to Genvoya.  Reviewed previous lab work and discussed plan of care.  Check blood work today.  Continue current dose of Genvoya.  Plan for follow-up in 4 months or sooner if needed with lab work on the same day.      Relevant Medications   elvitegravir-cobicistat-emtricitabine-tenofovir (GENVOYA) 150-150-200-10 MG TABS tablet   Other Relevant Orders   Comprehensive metabolic panel   T-helper cell (CD4)- ([facility] clinic only)   HIV-1 RNA quant-no reflex-bld   Tobacco abuse (Chronic)    Ms. Ivori Storr continues to smoke and is interested in quitting smoking.  She appears to be in the action stage of change.  Previously prescribed Chantix and would like to try this again.  Provided quitting information including the hotline.  Start Chantix.      Healthcare maintenance - Primary    Discussed importance of safe sexual practices and condom use.  Condoms offered. Due for routine breast cancer screening. Vaccines  up-to-date per recommendations.  Will be due for Shingrix.      Other Visit Diagnoses     Pharmacologic therapy       Relevant Orders   Lipid panel        I have discontinued Maliea Grandmaison Figgs's varenicline. I am also having her start on Chantix Starting Month Pak and varenicline. Additionally, I am having her maintain her nicotine, cetirizine, albuterol, lisinopril, rosuvastatin, and Genvoya.   Meds ordered this encounter  Medications   elvitegravir-cobicistat-emtricitabine-tenofovir (GENVOYA) 150-150-200-10 MG TABS tablet    Sig: Take 1 tablet by mouth daily with breakfast.    Dispense:  30 tablet    Refill:  6    Order Specific Question:   Supervising Provider    Answer:   Baxter Flattery, CYNTHIA [[id number]]   Varenicline Tartrate, Starter, (CHANTIX STARTING MONTH PAK) 0.5 MG X 11 & 1 MG X 42 TBPK    Sig: Take per package instructions.    Dispense:  53 each    Refill:  0    Order Specific Question:   Supervising Provider    Answer:   Baxter Flattery, CYNTHIA [[id number]]   varenicline (CHANTIX CONTINUING MONTH PAK) 1 MG tablet    Sig: Take 1 tablet (1 mg total) by mouth 2 (two) times daily.    Dispense:  60 tablet    Refill:  1    Fill at complete of starter pack.    Order Specific Question:   Supervising Provider    Answer:   Carlyle Basques [[id number]]     Follow-up: Return in about 4 months (around 03/31/2022), or if symptoms worsen or fail to improve.   Terri Piedra, MSN, FNP-C Nurse Practitioner [facility] [place] number: [phone]

## 2021-11-29 NOTE — Patient Instructions (Addendum)
Nice to see you.  We will check your lab work today.  Continue to take your medication daily as prescribed.  Refills have been sent to the pharmacy.  Plan for follow up in 4 months or sooner if needed with lab work on the same day.  Have a great day and stay safe!  

## 2021-11-30 LAB — T-HELPER CELL (CD4) - (RCID CLINIC ONLY)
CD4 % Helper T Cell: 25 % — ABNORMAL LOW (ref 33–65)
CD4 T Cell Abs: 609 /uL (ref 400–1790)

## 2021-12-03 LAB — COMPREHENSIVE METABOLIC PANEL
AG Ratio: 1.4 (calc) (ref 1.0–2.5)
ALT: 20 U/L (ref 6–29)
AST: 18 U/L (ref 10–35)
Albumin: 4.2 g/dL (ref 3.6–5.1)
Alkaline phosphatase (APISO): 101 U/L (ref 37–153)
BUN: 18 mg/dL (ref 7–25)
CO2: 28 mmol/L (ref 20–32)
Calcium: 8.8 mg/dL (ref 8.6–10.4)
Chloride: 106 mmol/L (ref 98–110)
Creat: 0.81 mg/dL (ref 0.50–1.03)
Globulin: 3.1 g/dL (calc) (ref 1.9–3.7)
Glucose, Bld: 91 mg/dL (ref 65–99)
Potassium: 3.8 mmol/L (ref 3.5–5.3)
Sodium: 141 mmol/L (ref 135–146)
Total Bilirubin: 0.4 mg/dL (ref 0.2–1.2)
Total Protein: 7.3 g/dL (ref 6.1–8.1)

## 2021-12-03 LAB — HIV-1 RNA QUANT-NO REFLEX-BLD
HIV 1 RNA Quant: NOT DETECTED Copies/mL
HIV-1 RNA Quant, Log: NOT DETECTED Log cps/mL

## 2021-12-03 LAB — LIPID PANEL
Cholesterol: 193 mg/dL (ref <200)
HDL: 62 mg/dL (ref >=50)
LDL Cholesterol (Calc): 105 mg/dL (calc) — ABNORMAL HIGH
Non-HDL Cholesterol (Calc): 131 mg/dL (calc) — ABNORMAL HIGH (ref <130)
Total CHOL/HDL Ratio: 3.1 (calc) (ref <5.0)
Triglycerides: 148 mg/dL (ref <150)

## 2021-12-04 ENCOUNTER — Encounter: Payer: Self-pay | Admitting: *Deleted

## 2021-12-05 ENCOUNTER — Other Ambulatory Visit: Payer: Self-pay | Admitting: Student

## 2021-12-18 ENCOUNTER — Ambulatory Visit (INDEPENDENT_AMBULATORY_CARE_PROVIDER_SITE_OTHER): Payer: Medicaid Other | Admitting: Student

## 2021-12-18 ENCOUNTER — Other Ambulatory Visit: Payer: Self-pay

## 2021-12-18 ENCOUNTER — Encounter: Payer: Self-pay | Admitting: Student

## 2021-12-18 VITALS — BP 146/77 | HR 75 | Temp 98.2°F | Ht 62.0 in | Wt 185.9 lb

## 2021-12-18 DIAGNOSIS — I10 Essential (primary) hypertension: Secondary | ICD-10-CM | POA: Diagnosis present

## 2021-12-18 DIAGNOSIS — F17210 Nicotine dependence, cigarettes, uncomplicated: Secondary | ICD-10-CM | POA: Diagnosis not present

## 2021-12-18 DIAGNOSIS — G589 Mononeuropathy, unspecified: Secondary | ICD-10-CM | POA: Diagnosis not present

## 2021-12-18 DIAGNOSIS — Z8639 Personal history of other endocrine, nutritional and metabolic disease: Secondary | ICD-10-CM | POA: Diagnosis not present

## 2021-12-18 DIAGNOSIS — G588 Other specified mononeuropathies: Secondary | ICD-10-CM

## 2021-12-18 MED ORDER — LISINOPRIL-HYDROCHLOROTHIAZIDE 20-12.5 MG PO TABS: 1.0000 | ORAL_TABLET | Freq: Every day | ORAL | 11 refills | Status: DC

## 2021-12-18 NOTE — Assessment & Plan Note (Signed)
Patient's blood pressure entering the clinic was 147/76 on upon recheck it was 146/77.  Patient did not bring a blood pressure log with her today and also has stopped keeping 1 since her last visit on August 21, 2021.  Patient is currently on lisinopril 20 mg, and denies any sort of side effects.  Her last BMP was on November 29, 2021 which showed normal sodium and a potassium of 3.8.  However, her blood pressure is not under control.  She is currently trying to quit smoking, and I have counseled her on how to have a better diet as well as start exercising as well.  Her ASCVD score is 14.8%.  Plan:   -In order to get a better control of her blood pressure we will be changing lisinopril 20 mg to lisinopril hydrochlorothiazide 20-12 0.5. - I encouraged her to continue with smoking cessation as well as eating a healthier diet, and occluding exercise in her day-to-day. - Will see her for follow-up in about 3 weeks to do a blood pressure check.

## 2021-12-18 NOTE — Assessment & Plan Note (Addendum)
Patient complains of numbness in the right hand on the fourth and fifth digit since on both dorsal and palmar aspects for the past 2 weeks.  She denies any pain, any falls, any trauma.  She denies any numbness in any other part of her body.  Patient has full range of motion of both hands.  Says is the first time something like this is ever happened.  Patient states that she cooks and cleans a lot to out the day because she has a very large family.  The overuse could be a cause of the neuropathy.  On physical exam, Phalen's test was negative, and upon sensory exam she could only feel light touch in those areas.  She states that it feels like those 2 fingers are asleep.   Plan: - I instructed the patient to go to [facility], [facility], or [facility] to attempt to get a brace to wear until her hand feels better. -  TSH and B12 were ordered to rule out other causes of neuropathy. - If lab results come back negative will consider the possibility of doing a nerve conduction study, to look for nerve entrapment of the ulnar nerve. - We will follow-up with patient at next appointment in 3 weeks.  Addendum:  12/19/21 Pt lab results came back and TSH, B12, and HbA1c are all within normal limits. Placed referral for Nerve Conduction Test and will follow up after it is done.

## 2021-12-18 NOTE — Assessment & Plan Note (Signed)
Patient has had a history of hyperglycemia in late 2020.  Patient is also obese, has hypertension, is above the age of 25, and meets qualifications for diabetes screening.  Patient is also experiencing  neuropathy as well, and undiagnosed and uncontrolled diabetes could be a culprit here.  Plan: - A1c level ordered today, will call patient back with the results as soon as available.

## 2021-12-18 NOTE — Progress Notes (Signed)
CC: Right 4th and 5th digit numbness  HPI:  [name] is a 56 y.o. female living with a history stated below and presents today for paresthesia of the right 4th and 5th digits. Please see problem based assessment and plan for additional details.  Past Medical History:  Diagnosis Date   Allergy    seasonal   GERD (gastroesophageal reflux disease)    HIV (human immunodeficiency virus infection) ([place])    Hyperlipidemia    Kidney stone    Substance abuse ([place])     Current Outpatient Medications on File Prior to Visit  Medication Sig Dispense Refill   albuterol (VENTOLIN HFA) 108 (90 Base) MCG/ACT inhaler Inhale 1-2 puffs into the lungs every 6 (six) hours as needed for wheezing or shortness of breath. 18 g 0   elvitegravir-cobicistat-emtricitabine-tenofovir (GENVOYA) 150-150-200-10 MG TABS tablet Take 1 tablet by mouth daily with breakfast. 30 tablet 6   rosuvastatin (CRESTOR) 5 MG tablet TAKE 1 TABLET (5 MG TOTAL) BY MOUTH DAILY. 90 tablet 1   varenicline (CHANTIX CONTINUING MONTH PAK) 1 MG tablet Take 1 tablet (1 mg total) by mouth 2 (two) times daily. 60 tablet 1   Varenicline Tartrate, Starter, (CHANTIX STARTING MONTH PAK) 0.5 MG X 11 & 1 MG X 42 TBPK Take per package instructions. 53 each 0   No current facility-administered medications on file prior to visit.    Family History  Problem Relation Age of Onset   Diabetes Brother    Hypertension Brother    Arthritis Mother    Healthy Father    Stomach cancer Neg Hx    Rectal cancer Neg Hx    Colon cancer Neg Hx    Colon polyps Neg Hx     Social History   Socioeconomic History   Marital status: Unknown    Spouse name: Not on file   Number of children: Not on file   Years of education: Not on file   Highest education level: Not on file  Occupational History   Occupation: PCA  Tobacco Use   Smoking status: Every Day    Packs/day: 1.00    Years: 30.00    Total pack years: 30.00    Types: Cigarettes    Smokeless tobacco: Never   Tobacco comments:    "cutting back," patient is ready to quit trying to cut back.   Vaping Use   Vaping Use: Former  Substance and Sexual Activity   Alcohol use: Yes    Alcohol/week: 0.0 standard drinks of alcohol    Comment: socially   Drug use: No    Comment: greater than 35 years ago   Sexual activity: Not Currently    Partners: Male    Comment: declined condoms  Other Topics Concern   Not on file  Social History Narrative   ** Merged History Encounter **       Social Determinants of Health   Financial Resource Strain: Low Risk  (07/29/2017)   Overall Financial Resource Strain (CARDIA)    Difficulty of Paying Living Expenses: Not hard at all  Food Insecurity: No Food Insecurity (07/29/2017)   Hunger Vital Sign    Worried About Running Out of Food in the Last Year: Never true    Ran Out of Food in the Last Year: Never true  Transportation Needs: No Transportation Needs (07/29/2017)   PRAPARE - Hydrologist (Medical): No    Lack of Transportation (Non-Medical): No  Physical Activity: Not  on file  Stress: Not on file  Social Connections: Not on file  Intimate Partner Violence: Not on file    Review of Systems: ROS negative except for what is noted on the assessment and plan.  Vitals:   12/18/21 0951 12/18/21 1001  BP: (!) 147/76 (!) 146/77  Pulse: 80 75  Temp: 98.2 F (36.8 C)   TempSrc: Oral   SpO2: 100%   Weight: 185 lb 14.4 oz (84.3 kg)   Height: '5\' 2"'$  (1.575 m)     Physical Exam: Constitutional: well-appearing woman, in no acute distress HENT: normocephalic atraumatic, mucous membranes moist Eyes: conjunctiva non-erythematous Neck: supple Cardiovascular: regular rate and rhythm, no m/r/g Pulmonary/Chest: normal work of breathing on room air, lungs clear to auscultation bilaterally Abdominal: soft, non-tender, non-distended MSK: normal bulk and tone Neurological: alert & oriented x 3, 5/5 strength in  bilateral upper and lower extremities, normal gait Skin: warm and dry   Assessment & Plan:   Essential hypertension Patient's blood pressure entering the clinic was 147/76 on upon recheck it was 146/77.  Patient did not bring a blood pressure log with her today and also has stopped keeping 1 since her last visit on August 21, 2021.  Patient is currently on lisinopril 20 mg, and denies any sort of side effects.  Her last BMP was on November 29, 2021 which showed normal sodium and a potassium of 3.8.  However, her blood pressure is not under control.  She is currently trying to quit smoking, and I have counseled her on how to have a better diet as well as start exercising as well.  Her ASCVD score is 14.8%.  Plan:   -In order to get a better control of her blood pressure we will be changing lisinopril 20 mg to lisinopril hydrochlorothiazide 20-12 0.5. - I encouraged her to continue with smoking cessation as well as eating a healthier diet, and occluding exercise in her day-to-day. - Will see her for follow-up in about 3 weeks to do a blood pressure check.   Mononeuropathy Patient complains of numbness in the right hand on the fourth and fifth digit since on both dorsal and palmar aspects for the past 2 weeks.  She denies any pain, any falls, any trauma.  She denies any numbness in any other part of her body.  Patient has full range of motion of both hands.  Says is the first time something like this is ever happened.  Patient states that she cooks and cleans a lot to out the day because she has a very large family.  The overuse could be a cause of the neuropathy.  On physical exam, Phalen's test was negative, and upon sensory exam she could only feel light touch in those areas.  She states that those 2 fingers are asleep.   Plan: - I instructed the patient to go to [facility], [facility], or [facility] to attempt to get a brace to wear until her hand feels better. -  TSH and B12 were ordered to rule out other  causes of neuropathy. - If lab results come back negative will consider the possibility of doing a nerve conduction study, to look for nerve entrapment of the ulnar nerve. - We will follow-up with patient at next appointment in 3 weeks.  History of hyperglycemia Patient has had a history of hyperglycemia in late 2020.  Patient is also obese, has hypertension, is above the age of 51, and meets qualifications for diabetes screening.  Patient is also  experiencing  neuropathy as well, and undiagnosed and uncontrolled diabetes could be a culprit here.  Plan: - A1c level ordered today, will call patient back with the results as soon as available.  Patient seen with Dr. Kelle Darting Clevester Helzer, M.D. [facility], PGY-1 Phone: [phone] Date 12/18/2021 Time 12:30 PM

## 2021-12-18 NOTE — Patient Instructions (Signed)
Thank you so much [name] for coming into the clinic today.  Here is a quick summary of everything we discussed.    1.  We talked about the numbness that you are having in your pinky finger and the ring finger today.  It could be from a lot of things so we are getting some labs just to rule out a few things. Also remember to get the wrist splint from [place], [place], [facility] anywhere you can find it.  I will give you a call back when the results come back. I'd like to see you back in about 3 weeks!  2.  We also talked about your blood pressure were going to actually change her medication to something called lisinopril and lisinopril and hydrochlorothiazide at the same time as all 1 pills so it will be easy as well.  Make sure you keep track of your blood pressure at home and also watch your diet and start exercise and use that equipment!  Thank you so much again for coming into the clinic if, if you have any questions please do not hesitate to call the clinic at [phone] thank you.  Dr. Marlene Lard Auston Halfmann

## 2021-12-19 LAB — TSH: TSH: 1.35 u[IU]/mL (ref 0.450–4.500)

## 2021-12-19 LAB — HEMOGLOBIN A1C
Est. average glucose Bld gHb Est-mCnc: 108 mg/dL
Hgb A1c MFr Bld: 5.4 % (ref 4.8–5.6)

## 2021-12-19 LAB — VITAMIN B12: Vitamin B-12: 588 pg/mL (ref 232–1245)

## 2021-12-19 NOTE — Addendum Note (Signed)
Addended byDrucie Opitz on: 12/19/2021 09:10 AM   Modules accepted: Orders

## 2021-12-20 ENCOUNTER — Other Ambulatory Visit: Payer: Self-pay | Admitting: Student

## 2021-12-21 NOTE — Telephone Encounter (Signed)
Will not refill lisinopril as switched to combination pill

## 2021-12-25 ENCOUNTER — Other Ambulatory Visit: Payer: Self-pay | Admitting: Family

## 2021-12-25 MED ORDER — VARENICLINE TARTRATE 1 MG PO TABS: 1.0000 mg | ORAL_TABLET | Freq: Two times a day (BID) | ORAL | 1 refills | Status: DC

## 2021-12-25 NOTE — Telephone Encounter (Signed)
Please advise 

## 2021-12-25 NOTE — Addendum Note (Signed)
Addended by: Mauricio Po D on: 12/25/2021 02:33 PM   Modules accepted: Orders

## 2022-01-08 NOTE — Progress Notes (Signed)
Internal Medicine Clinic Attending  I saw and evaluated the patient.  I personally confirmed the key portions of the history and exam documented by Dr. Nooruddin and I reviewed pertinent patient test results.  The assessment, diagnosis, and plan were formulated together and I agree with the documentation in the resident's note.  

## 2022-02-28 ENCOUNTER — Ambulatory Visit: Payer: Medicaid Other

## 2022-02-28 VITALS — BP 144/72 | HR 76 | Ht 64.0 in | Wt 190.7 lb

## 2022-02-28 DIAGNOSIS — G589 Mononeuropathy, unspecified: Secondary | ICD-10-CM | POA: Diagnosis present

## 2022-02-28 DIAGNOSIS — Z23 Encounter for immunization: Secondary | ICD-10-CM

## 2022-02-28 DIAGNOSIS — I10 Essential (primary) hypertension: Secondary | ICD-10-CM | POA: Diagnosis not present

## 2022-02-28 DIAGNOSIS — F17210 Nicotine dependence, cigarettes, uncomplicated: Secondary | ICD-10-CM

## 2022-02-28 MED ORDER — VARENICLINE TARTRATE 1 MG PO TABS: 1.0000 mg | ORAL_TABLET | Freq: Two times a day (BID) | ORAL | 1 refills | Status: DC

## 2022-02-28 NOTE — Assessment & Plan Note (Signed)
Patient's blood pressure elevated again today at 146/76.  She has not started taking her new lisinopril-HCTZ 20-12.5 mg daily.  She is also not been taking her blood pressures at home.  She denies headaches or vision changes.  -Return in 1 month for repeat blood pressure check after patient has taken new medicine.  Encourage patient to take her blood pressure every morning and bring log with her at follow-up.

## 2022-02-28 NOTE — Assessment & Plan Note (Signed)
Patient continues to endorse symptoms addressed at last visit.  Her symptoms have not changed.  She continues to endorse numbness and tingling over her fourth and fifth digit on the right hand.  Denies pain, loss of sensation, or weakness.  She states that she does flex her arm and wrist when she sleeps.  No pain with palpation of ulnar notch.  Distribution of symptoms most consistent with cubital tunnel syndrome.  -Encourage patient to try elbow brace for cubital tunnel syndrome -Follow-up in 1 month

## 2022-02-28 NOTE — Progress Notes (Signed)
   CC: Poor circulation of the right arm  HPI:  [name] is a 56 y.o. with past medical history as below who presents for blood pressure recheck.  Past Medical History:  Diagnosis Date   Allergy    seasonal   GERD (gastroesophageal reflux disease)    HIV (human immunodeficiency virus infection) ([place])    Hyperlipidemia    Kidney stone    Substance abuse ([place])    Review of Systems: See detailed assessment and plan for pertinent ROS.  Physical Exam:  Vitals:   02/28/22 1030  BP: (!) 144/72  Pulse: 76  SpO2: 100%  Weight: 190 lb 11.2 oz (86.5 kg)  Height: '5\' 4"'$  (1.626 m)   Physical Exam Constitutional:      General: She is not in acute distress. HENT:     Head: Normocephalic and atraumatic.  Eyes:     Extraocular Movements: Extraocular movements intact.  Cardiovascular:     Rate and Rhythm: Normal rate and regular rhythm.     Heart sounds: No murmur heard. Pulmonary:     Effort: Pulmonary effort is normal.  Skin:    General: Skin is warm and dry.  Neurological:     Mental Status: She is alert.     Comments: Right hand and fingers with intact strength and sensation.      Assessment & Plan:   See Encounters Tab for problem based charting.  Mononeuropathy Patient continues to endorse symptoms addressed at last visit.  Her symptoms have not changed.  She continues to endorse numbness and tingling over her fourth and fifth digit on the right hand.  Denies pain, loss of sensation, or weakness.  She states that she does flex her arm and wrist when she sleeps.  No pain with palpation of ulnar notch.  Distribution of symptoms most consistent with cubital tunnel syndrome.  -Encourage patient to try elbow brace for cubital tunnel syndrome -Follow-up in 1 month  Essential hypertension Patient's blood pressure elevated again today at 146/76.  She has not started taking her new lisinopril-HCTZ 20-12.5 mg daily.  She is also not been taking her blood pressures  at home.  She denies headaches or vision changes.  -Return in 1 month for repeat blood pressure check after patient has taken new medicine.  Encourage patient to take her blood pressure every morning and bring log with her at follow-up.    Patient seen with Dr. Dareen Piano

## 2022-02-28 NOTE — Patient Instructions (Signed)
Vanessa Browning, it was a pleasure seeing you today!  Today we discussed: Blood pressure: Take your new medication and we will see you back in 1 month for blood pressure recheck. Please bring a log of your home blood pressure readings. Right ulnar neuropathy: Purchase a cubital tunnel brace and we will see if this has helped by your next follow up in 1 month.  I have ordered the following labs today:  Lab Orders  No laboratory test(s) ordered today     Tests ordered today:  none  Referrals ordered today:   Referral Orders  No referral(s) requested today     I have ordered the following medication/changed the following medications:   Stop the following medications: Medications Discontinued During This Encounter  Medication Reason   varenicline (CHANTIX CONTINUING MONTH PAK) 1 MG tablet Reorder     Start the following medications: Meds ordered this encounter  Medications   varenicline (CHANTIX CONTINUING MONTH PAK) 1 MG tablet    Sig: Take 1 tablet (1 mg total) by mouth 2 (two) times daily.    Dispense:  60 tablet    Refill:  1    Fill at complete of starter pack.     Follow-up:  1 month    Please make sure to arrive 15 minutes prior to your next appointment. If you arrive late, you may be asked to reschedule.   We look forward to seeing you next time. Please call our clinic at [phone] if you have any questions or concerns. The best time to call is Monday-Friday from 9am-4pm, but there is someone available 24/7. If after hours or the weekend, call the main hospital number and ask for the Internal Medicine Resident On-Call. If you need medication refills, please notify your pharmacy one week in advance and they will send [place] a request.  Thank you for letting us take part in your care. Wishing you the best!  Thank you, Linward Natal, MD

## 2022-03-01 NOTE — Progress Notes (Signed)
Internal Medicine Clinic Attending  I saw and evaluated the patient.  I personally confirmed the key portions of the history and exam documented by the resident  and I reviewed pertinent patient test results.  The assessment, diagnosis, and plan were formulated together and I agree with the documentation in the resident's note.  

## 2022-03-24 ENCOUNTER — Other Ambulatory Visit: Payer: Self-pay

## 2022-03-28 ENCOUNTER — Encounter: Payer: Medicaid Other | Admitting: Student

## 2022-03-28 ENCOUNTER — Encounter: Payer: Self-pay | Admitting: Student

## 2022-03-28 NOTE — Progress Notes (Deleted)
02/28/2022 numbness and tingling of her fourth and fifth digit of the right hand-elbow brace for cubital tunnel Recheck blood pressure after initiating lisinopril/HCTZ 20/12.5 daily, prior 146/76 Next ID appointment on 04/09/2022, prior 11/29/2021 CD4 count 609 and HIV RNA quant undetectable LDL 105,?  Rosuvastatin 5 mg

## 2022-04-09 ENCOUNTER — Other Ambulatory Visit: Payer: Self-pay

## 2022-04-09 ENCOUNTER — Ambulatory Visit: Payer: Medicaid Other | Admitting: Family

## 2022-04-09 ENCOUNTER — Encounter: Payer: Self-pay | Admitting: Family

## 2022-04-09 VITALS — BP 118/80 | HR 75 | Temp 97.8°F | Ht 64.0 in | Wt 190.0 lb

## 2022-04-09 DIAGNOSIS — B20 Human immunodeficiency virus [HIV] disease: Secondary | ICD-10-CM | POA: Diagnosis present

## 2022-04-09 DIAGNOSIS — Z0000 Encounter for general adult medical examination without abnormal findings: Secondary | ICD-10-CM

## 2022-04-09 DIAGNOSIS — Z9189 Other specified personal risk factors, not elsewhere classified: Secondary | ICD-10-CM | POA: Diagnosis not present

## 2022-04-09 MED ORDER — GENVOYA 150-150-200-10 MG PO TABS: 1.0000 | ORAL_TABLET | Freq: Every day | ORAL | 6 refills | Status: DC

## 2022-04-09 NOTE — Patient Instructions (Signed)
Nice to see you. ? ?We will check your lab work today. ? ?Continue to take your medication daily as prescribed. ? ?Refills have been sent to the pharmacy. ? ?Plan for follow up in 6 months or sooner if needed with lab work on the same day. ? ?Have a great day and stay safe! ? ?

## 2022-04-09 NOTE — Progress Notes (Signed)
Brief Narrative   Patient ID: Vanessa Browning, female    DOB: 04/07/1966, 56 y.o.   MRN: [id number]  [name] is a 56 y/o AA female diagnosed with HIV disease around 39 with risk factor of heterosexual contact. Initial CD4 count and viral load unknown. Genotype fro 03/2013 with K103N resistance. No history of opportunistic infection. [id number] negative. Previous ART history with Kaletra, Isentress, Truvada and now Genvoya.  Subjective:    Chief Complaint  Patient presents with   Follow-up    HPI:  Vanessa Browning is a 55 y.o. female with HIV disease last seen on 11/29/21 with well-controlled virus and good adherence and tolerance to Genvoya.  Viral load was undetectable with CD4 count of 609.  Kidney function, liver function, electrolytes within normal ranges.  Here today for routine follow-up.  [name] has been doing well since her last office visit and continues to take her Genvoya daily as prescribed with no adverse side effects or problems obtaining from the pharmacy.  Is concerned about her CD4 count and maintaining her overall health.  Condoms and STD testing offered.  In the process of completing her Shingrix and has received her flu vaccination.  Denies fevers, chills, night sweats, headaches, changes in vision, neck pain/stiffness, nausea, diarrhea, vomiting, lesions or rashes.  Allergies  Allergen Reactions   Dexilant [Dexlansoprazole]     Hands swelling       Outpatient Medications Prior to Visit  Medication Sig Dispense Refill   albuterol (VENTOLIN HFA) 108 (90 Base) MCG/ACT inhaler Inhale 1-2 puffs into the lungs every 6 (six) hours as needed for wheezing or shortness of breath. 18 g 0   lisinopril-hydrochlorothiazide (ZESTORETIC) 20-12.5 MG tablet Take 1 tablet by mouth daily. 30 tablet 11   rosuvastatin (CRESTOR) 5 MG tablet TAKE 1 TABLET (5 MG TOTAL) BY MOUTH DAILY. 90 tablet 1   varenicline (CHANTIX) 1 MG tablet TAKE 1 TABLET BY MOUTH TWICE A  DAY 180 tablet 1   elvitegravir-cobicistat-emtricitabine-tenofovir (GENVOYA) 150-150-200-10 MG TABS tablet Take 1 tablet by mouth daily with breakfast. 30 tablet 6   No facility-administered medications prior to visit.     Past Medical History:  Diagnosis Date   Allergy    seasonal   GERD (gastroesophageal reflux disease)    HIV (human immunodeficiency virus infection) ([place])    Hyperlipidemia    Kidney stone    Substance abuse ([place])      Past Surgical History:  Procedure Laterality Date   APPENDECTOMY     APPENDECTOMY     CHOLECYSTECTOMY     INDUCED ABORTION     TONSILLECTOMY        Review of Systems  Constitutional:  Negative for appetite change, chills, diaphoresis, fatigue, fever and unexpected weight change.  Eyes:        Negative for acute change in vision  Respiratory:  Negative for chest tightness, shortness of breath and wheezing.   Cardiovascular:  Negative for chest pain.  Gastrointestinal:  Negative for diarrhea, nausea and vomiting.  Genitourinary:  Negative for dysuria, pelvic pain and vaginal discharge.  Musculoskeletal:  Negative for neck pain and neck stiffness.  Skin:  Negative for rash.  Neurological:  Negative for seizures, syncope, weakness and headaches.  Hematological:  Negative for adenopathy. Does not bruise/bleed easily.  Psychiatric/Behavioral:  Negative for hallucinations.       Objective:    BP 118/80   Pulse 75   Temp 97.8 F (36.6 C) (Oral)  Ht '5\' 4"'$  (1.626 m)   Wt 190 lb (86.2 kg)   SpO2 97%   BMI 32.61 kg/m  Nursing note and vital signs reviewed.  Physical Exam Constitutional:      General: She is not in acute distress.    Appearance: She is well-developed.  Eyes:     Conjunctiva/sclera: Conjunctivae normal.  Cardiovascular:     Rate and Rhythm: Normal rate and regular rhythm.     Heart sounds: Normal heart sounds. No murmur heard.    No friction rub. No gallop.  Pulmonary:     Effort: Pulmonary effort is normal. No  respiratory distress.     Breath sounds: Normal breath sounds. No wheezing or rales.  Chest:     Chest wall: No tenderness.  Abdominal:     General: Bowel sounds are normal.     Palpations: Abdomen is soft.     Tenderness: There is no abdominal tenderness.  Musculoskeletal:     Cervical back: Neck supple.  Lymphadenopathy:     Cervical: No cervical adenopathy.  Skin:    General: Skin is warm and dry.     Findings: No rash.  Neurological:     Mental Status: She is alert and oriented to person, place, and time.  Psychiatric:        Behavior: Behavior normal.        Thought Content: Thought content normal.        Judgment: Judgment normal.         04/09/2022   10:45 AM 02/28/2022   10:29 AM 12/18/2021    9:58 AM 08/21/2021    9:16 AM 07/31/2021   11:41 AM  Depression screen PHQ 2/9  Decreased Interest 0 0 0 0 0  Down, Depressed, Hopeless 0 0 0 0 0  PHQ - 2 Score 0 0 0 0 0  Altered sleeping    0   Tired, decreased energy    0   Change in appetite    0   Feeling bad or failure about yourself     0   Trouble concentrating    0   Moving slowly or fidgety/restless    0   Suicidal thoughts    0   PHQ-9 Score    0   Difficult doing work/chores    Not difficult at all        Assessment & Plan:    Patient Active Problem List   Diagnosis Date Noted   History of hyperglycemia 12/18/2021   Chronic low back pain without sciatica 07/24/2021   Generalized anxiety disorder 02/12/2020   Essential hypertension 02/12/2020   Healthcare maintenance 09/04/2018   LGSIL on Pap smear of cervix 11/06/2017   Obesity, Class I, BMI 30-34.9 01/20/2016   Hyperlipidemia 10/01/2012   Tobacco abuse 05/31/2011   Cervical herniated disc 12/09/2008   Mononeuropathy 08/19/2008   Human immunodeficiency virus (HIV) disease ([place]) 01/13/2007   Allergic rhinitis 01/13/2007     Problem List Items Addressed This Visit       Other   Human immunodeficiency virus (HIV) disease ([place]) - Primary  (Chronic)    [name] continues to have well-controlled virus with good adherence and tolerance to Genvoya. Reviewed previous lab work and discussed plan of care. Check lab work today. Continue current dose of Genvoya. Plan for follow up 6 months or sooner if needed with lab work on the same day.       Relevant Medications   elvitegravir-cobicistat-emtricitabine-tenofovir (GENVOYA) 150-150-200-10 MG  TABS tablet   Other Relevant Orders   Comprehensive metabolic panel   HIV-1 RNA quant-no reflex-bld   T-helper cell (CD4)- ([facility] clinic only)   Hepatitis C Antibody   Healthcare maintenance    Discussed importance of safe sexual practice and condom use. Condoms and STD testing offered.  Routine dental care not needed with dentures.  Referral placed for mammogram.       Other Visit Diagnoses     At risk for breast cancer       Relevant Orders   MM Digital Screening        I am having [name] maintain her albuterol, rosuvastatin, lisinopril-hydrochlorothiazide, varenicline, and Genvoya.   Meds ordered this encounter  Medications   elvitegravir-cobicistat-emtricitabine-tenofovir (GENVOYA) 150-150-200-10 MG TABS tablet    Sig: Take 1 tablet by mouth daily with breakfast.    Dispense:  30 tablet    Refill:  6    Order Specific Question:   Supervising Provider    Answer:   Carlyle Basques [[id number]]     Follow-up: Return in about 6 months (around 10/09/2022).   Terri Piedra, MSN, FNP-C Nurse Practitioner [facility] number: [phone]

## 2022-04-09 NOTE — Assessment & Plan Note (Addendum)
   Discussed importance of safe sexual practice and condom use. Condoms and STD testing offered.   Routine dental care not needed with dentures.   Referral placed for mammogram.

## 2022-04-09 NOTE — Assessment & Plan Note (Addendum)
[name] continues to have well-controlled virus with good adherence and tolerance to Genvoya. Reviewed previous lab work and discussed plan of care. Check lab work today. Continue current dose of Genvoya. Plan for follow up 6 months or sooner if needed with lab work on the same day.

## 2022-04-10 LAB — T-HELPER CELL (CD4) - (RCID CLINIC ONLY)
CD4 % Helper T Cell: 25 % — ABNORMAL LOW (ref 33–65)
CD4 T Cell Abs: 472 /uL (ref 400–1790)

## 2022-04-11 LAB — COMPREHENSIVE METABOLIC PANEL
AG Ratio: 1.4 (calc) (ref 1.0–2.5)
ALT: 15 U/L (ref 6–29)
AST: 16 U/L (ref 10–35)
Albumin: 4.3 g/dL (ref 3.6–5.1)
Alkaline phosphatase (APISO): 100 U/L (ref 37–153)
BUN: 16 mg/dL (ref 7–25)
CO2: 27 mmol/L (ref 20–32)
Calcium: 9.7 mg/dL (ref 8.6–10.4)
Chloride: 107 mmol/L (ref 98–110)
Creat: 0.89 mg/dL (ref 0.50–1.03)
Globulin: 3.1 g/dL (calc) (ref 1.9–3.7)
Glucose, Bld: 88 mg/dL (ref 65–99)
Potassium: 3.7 mmol/L (ref 3.5–5.3)
Sodium: 142 mmol/L (ref 135–146)
Total Bilirubin: 0.4 mg/dL (ref 0.2–1.2)
Total Protein: 7.4 g/dL (ref 6.1–8.1)

## 2022-04-11 LAB — HIV-1 RNA QUANT-NO REFLEX-BLD
HIV 1 RNA Quant: NOT DETECTED Copies/mL
HIV-1 RNA Quant, Log: NOT DETECTED Log cps/mL

## 2022-04-11 LAB — HEPATITIS C ANTIBODY: Hepatitis C Ab: NONREACTIVE

## 2022-04-23 ENCOUNTER — Ambulatory Visit: Payer: Medicaid Other | Admitting: Internal Medicine

## 2022-04-23 VITALS — BP 131/62 | HR 75 | Temp 98.2°F | Ht 64.0 in | Wt 191.1 lb

## 2022-04-23 DIAGNOSIS — I10 Essential (primary) hypertension: Secondary | ICD-10-CM | POA: Diagnosis not present

## 2022-04-23 DIAGNOSIS — M5020 Other cervical disc displacement, unspecified cervical region: Secondary | ICD-10-CM | POA: Diagnosis present

## 2022-04-23 DIAGNOSIS — F17210 Nicotine dependence, cigarettes, uncomplicated: Secondary | ICD-10-CM

## 2022-04-23 NOTE — Patient Instructions (Signed)
Dear Mrs. Vanessa Browning,  Thank you for trusting [place] with your care today. We discussed your numbness and tingling. We also discussed your blood pressure.   We would like to get an MRI of your neck to further evaluate this numbness/tingling in your arms.  Your blood pressure is looking good today. We do not need to make any changes. Keep up the great work.  Please return in 3 months for a follow up.

## 2022-04-23 NOTE — Progress Notes (Unsigned)
   CC: f/u  HPI:[name] is a 56 y.o. female who presents for evaluation of hand numbness. Please see individual problem based A/P for details.  Patient reports ongoing numbness and tingling of 4-5 digits of right hand. An elbow brace was recommended to her last visit, but she has not utilized this yet. She has been using a wrist brace which she reports has some benefit. She also reports than when she sleeps on her side, her entire arm will go numb.   Reports she has been doing well with HTN regimen. No side effects and no difficulty accessing medicines. States most readings are typically in 110s. Does reports 7-8 readings over 140. Has been cutting down on her smoking as well, now down to 1 cigarette per day.   Depression, PHQ-9: Based on the patients  [place] Visit from 08/21/2021 in [facility]  PHQ-9 Total Score 0      score we have .  Past Medical History:  Diagnosis Date   Allergy    seasonal   GERD (gastroesophageal reflux disease)    HIV (human immunodeficiency virus infection) ([place])    Hyperlipidemia    Kidney stone    Substance abuse ([place])    Review of Systems:   See hpi  Physical Exam: Vitals:   04/23/22 1452  BP: 131/62  Pulse: 75  Temp: 98.2 F (36.8 C)  TempSrc: Oral  SpO2: 99%  Weight: 191 lb 1.6 oz (86.7 kg)  Height: '5\' 4"'$  (1.626 m)    General: NAD HEENT: Conjunctiva nl , antiicteric sclerae, moist mucous membranes, no exudate or erythema Cardiovascular: Normal rate, regular rhythm.  No murmurs, rubs, or gallops Pulmonary : Equal breath sounds, No wheezes, rales, or rhonchi Abdominal: soft, nontender,  bowel sounds present Ext: No edema in lower extremities, no tenderness to palpation of lower extremities.  Neuro: Subjective decrease in sensation along 4-5th digits. Grip strength intact. Motor exam of RUE normal. No deficiencies in dexterity or coordination.   Assessment & Plan:   See Encounters  Tab for problem based charting.  Whole arm involvement concerning for cervical involvement. She does have a history of herniated cervical disc. Recommended patient utilize elbow brace while sleeping. Will order MRI to re-evaluate c-spine. Depending on these findings, can consider referral to [facility]. If MRI normal, and no improvement in symptoms with brace, can consider referral to ortho for nerve release/injections.   BP appears to be well managed currently. No changes to regimen at this time.  Patient discussed with Dr. Daryll Drown

## 2022-04-25 ENCOUNTER — Encounter: Payer: Self-pay | Admitting: Internal Medicine

## 2022-04-25 NOTE — Assessment & Plan Note (Signed)
Patient reports ongoing numbness and tingling of 4-5 digits of right hand. An elbow brace was recommended to her last visit, but she has not utilized this yet. She has been using a wrist brace which she reports has some benefit. She also reports than when she sleeps on her side, her entire arm will go numb.   Subjective decrease in sensation along 4-5th digits. Grip strength intact. Motor exam of RUE normal. No deficiencies in dexterity or coordination.   Whole arm involvement concerning for cervical involvement. She does have a history of herniated cervical disc. Recommended patient utilize elbow brace while sleeping. Will order MRI to re-evaluate c-spine. Depending on these findings, can consider referral to [facility]. If MRI normal, and no improvement in symptoms with brace, can consider referral to ortho for nerve release/injections.

## 2022-04-25 NOTE — Assessment & Plan Note (Signed)
Reports she has been doing well with HTN regimen. No side effects and no difficulty accessing medicines. States most readings are typically in 110s. Does reports 7-8 readings over 140. Has been cutting down on her smoking as well, now down to 1 cigarette per day.  BP appears to be well managed currently. No changes to regimen at this time.

## 2022-05-07 NOTE — Progress Notes (Signed)
Internal Medicine Clinic Attending  Case discussed with Dr. Elliot Gurney  at the time of the visit.  We reviewed the resident's history and exam and pertinent patient test results.  I agree with the assessment, diagnosis, and plan of care documented in the resident's note.

## 2022-05-28 ENCOUNTER — Ambulatory Visit
Admission: EM | Admit: 2022-05-28 | Discharge: 2022-05-28 | Disposition: A | Discharge status: Home / Self Care | Payer: Medicaid Other | Attending: Nurse Practitioner | Admitting: Nurse Practitioner

## 2022-05-28 DIAGNOSIS — G8929 Other chronic pain: Secondary | ICD-10-CM

## 2022-05-28 DIAGNOSIS — M542 Cervicalgia: Secondary | ICD-10-CM

## 2022-05-28 DIAGNOSIS — R202 Paresthesia of skin: Secondary | ICD-10-CM

## 2022-05-28 MED ORDER — PREDNISONE 20 MG PO TABS: 40.0000 mg | ORAL_TABLET | Freq: Every day | ORAL | 0 refills | Status: AC

## 2022-05-28 MED ORDER — CYCLOBENZAPRINE HCL 10 MG PO TABS: 10.0000 mg | ORAL_TABLET | Freq: Two times a day (BID) | ORAL | 0 refills | Status: DC | PRN

## 2022-05-28 NOTE — ED Provider Notes (Signed)
Vanessa Browning    [name]: [id number] Arrival date & time: 05/28/22  1428      History   Chief Complaint Chief Complaint  Patient presents with   Neck Pain   Numbness    HPI Vanessa Browning Vanessa Browning is a 56 y.o. female for evaluation of chronic neck pain.  Patient reports history of some type of neck injury/slipped disc for years.  Over the past several months that she has been having increasing pain in her neck with paresthesias in her right hand that are now traveling up to her right elbow.  She seen her PCP for this and they did order an MRI of her neck but this has not been done.  She was supposed to be wearing a elbow brace at night to help with this but she has not been doing this due to comfort issues.  She is also awaiting on referral to neurology.  She denies any new injury or inciting event.  She has been taking over-the-counter Tylenol and ibuprofen with temporary improvement in pain.  No other concerns at this time.  HPI  Past Medical History:  Diagnosis Date   Allergy    seasonal   GERD (gastroesophageal reflux disease)    HIV (human immunodeficiency virus infection) ([place])    Hyperlipidemia    Kidney stone    Substance abuse ([place])     Patient Active Problem List   Diagnosis Date Noted   History of hyperglycemia 12/18/2021   Chronic low back pain without sciatica 07/24/2021   Generalized anxiety disorder 02/12/2020   Essential hypertension 02/12/2020   Healthcare maintenance 09/04/2018   LGSIL on Pap smear of cervix 11/06/2017   Obesity, Class I, BMI 30-34.9 01/20/2016   Hyperlipidemia 10/01/2012   Tobacco abuse 05/31/2011   Cervical herniated disc 12/09/2008   Mononeuropathy 08/19/2008   Human immunodeficiency virus (HIV) disease ([place]) 01/13/2007   Allergic rhinitis 01/13/2007    Past Surgical History:  Procedure Laterality Date   APPENDECTOMY     APPENDECTOMY     CHOLECYSTECTOMY     INDUCED ABORTION     TONSILLECTOMY      OB History      Gravida  6   Para  0   Term  0   Preterm  0   AB  1   Living         SAB  0   IAB  0   Ectopic  0   Multiple      Live Births               Home Medications    Prior to Admission medications   Medication Sig Start Date End Date Taking? Authorizing Provider  cyclobenzaprine (FLEXERIL) 10 MG tablet Take 1 tablet (10 mg total) by mouth 2 (two) times daily as needed for muscle spasms. 05/28/22  Yes Melynda Ripple, NP  predniSONE (DELTASONE) 20 MG tablet Take 2 tablets (40 mg total) by mouth daily with breakfast for 5 days. 05/28/22 06/02/22 Yes Melynda Ripple, NP  albuterol (VENTOLIN HFA) 108 (90 Base) MCG/ACT inhaler Inhale 1-2 puffs into the lungs every 6 (six) hours as needed for wheezing or shortness of breath. 02/10/21   Jaynee Eagles, PA-C  elvitegravir-cobicistat-emtricitabine-tenofovir (GENVOYA) 150-150-200-10 MG TABS tablet Take 1 tablet by mouth daily with breakfast. 04/09/22   Golden Circle, FNP  lisinopril-hydrochlorothiazide (ZESTORETIC) 20-12.5 MG tablet Take 1 tablet by mouth daily. 12/18/21 12/18/22  Riesa Pope, MD  rosuvastatin (CRESTOR) 5 MG  tablet TAKE 1 TABLET (5 MG TOTAL) BY MOUTH DAILY. 09/07/21 09/07/22  Mitzi Hansen, MD  varenicline (CHANTIX) 1 MG tablet TAKE 1 TABLET BY MOUTH TWICE A DAY 03/26/22   Johny Blamer, DO    Family History Family History  Problem Relation Age of Onset   Diabetes Brother    Hypertension Brother    Arthritis Mother    Healthy Father    Stomach cancer Neg Hx    Rectal cancer Neg Hx    Colon cancer Neg Hx    Colon polyps Neg Hx     Social History Social History   Tobacco Use   Smoking status: Every Day    Packs/day: 0.10    Years: 30.00    Total pack years: 3.00    Types: Cigarettes   Smokeless tobacco: Never   Tobacco comments:    "cutting back," patient is ready to quit, takes about a week to go through a pack  Vaping Use   Vaping Use: Former  Substance Use Topics   Alcohol use: Yes     Comment: occasional   Drug use: No    Comment: greater than 35 years ago     Allergies   Dexilant [dexlansoprazole]   Review of Systems Review of Systems  Musculoskeletal:  Positive for neck pain.     Physical Exam Triage Vital Signs ED Triage Vitals  Enc Vitals Group     BP 05/28/22 1500 129/84     Pulse Rate 05/28/22 1500 91     Resp 05/28/22 1500 18     Temp 05/28/22 1500 98.6 F (37 C)     Temp Source 05/28/22 1500 Oral     SpO2 05/28/22 1500 93 %     Weight --      Height --      Head Circumference --      Peak Flow --      Pain Score 05/28/22 1503 7     Pain Loc --      Pain Edu? --      Excl. in [place]? --    No data found.  Updated Vital Signs BP 129/84 (BP Location: Left Arm)   Pulse 91   Temp 98.6 F (37 C) (Oral)   Resp 18   SpO2 93%   Visual Acuity Right Eye Distance:   Left Eye Distance:   Bilateral Distance:    Right Eye Near:   Left Eye Near:    Bilateral Near:     Physical Exam Vitals and nursing note reviewed.  Constitutional:      Appearance: Normal appearance.  HENT:     Head: Normocephalic and atraumatic.  Eyes:     Pupils: Pupils are equal, round, and reactive to light.  Neck:   Cardiovascular:     Rate and Rhythm: Normal rate.  Pulmonary:     Effort: Pulmonary effort is normal.  Musculoskeletal:     Cervical back: Normal range of motion and neck supple. No rigidity or torticollis. Pain with movement and muscular tenderness present. No spinous process tenderness. Normal range of motion.  Skin:    General: Skin is warm and dry.  Neurological:     General: No focal deficit present.     Mental Status: She is alert and oriented to person, place, and time.     Deep Tendon Reflexes:     Reflex Scores:      Tricep reflexes are 2+ on the right side and 2+ on the left  side. Psychiatric:        Mood and Affect: Mood normal.        Behavior: Behavior normal.      UC Treatments / Results  Labs (all labs ordered are listed,  but only abnormal results are displayed) Labs Reviewed - No data to display  EKG   Radiology No results found.  Procedures Procedures (including critical Browning time)  Medications Ordered in UC Medications - No data to display  Initial Impression / Assessment and Plan / UC Course  I have reviewed the triage vital signs and the nursing notes.  Pertinent labs & imaging results that were available during my Browning of the patient were reviewed by me and considered in my medical decision making (see chart for details).     Reviewed exam and symptoms with patient.  Discussed limitations of urgent Browning Advised that she will need PCP/neurology follow-up for permanent treatment of her symptoms Can alleviate some of her discomfort with Flexeril and prednisone  she can continue over-the-counter Tylenol  Follow-up with PCP 2 days for recheck ER precautions reviewed and patient verbalized understanding Final Clinical Impressions(s) / UC Diagnoses   Final diagnoses:  Chronic neck pain  Paresthesias in right hand     Discharge Instructions      Flexeril as needed.  Please note this medication make you drowsy.  Do not drink alcohol or drive on this medication Prednisone as prescribed You may continue over-the-counter Tylenol Please follow-up with your PCP for further evaluation and treatment options Please go to the emergency room if any of your symptoms worsen   ED Prescriptions     Medication Sig Dispense Auth. Provider   cyclobenzaprine (FLEXERIL) 10 MG tablet Take 1 tablet (10 mg total) by mouth 2 (two) times daily as needed for muscle spasms. 10 tablet Melynda Ripple, NP   predniSONE (DELTASONE) 20 MG tablet Take 2 tablets (40 mg total) by mouth daily with breakfast for 5 days. 10 tablet Melynda Ripple, NP      PDMP not reviewed this encounter.   Melynda Ripple, NP 05/28/22 [phone]

## 2022-05-28 NOTE — Discharge Instructions (Signed)
Flexeril as needed.  Please note this medication make you drowsy.  Do not drink alcohol or drive on this medication Prednisone as prescribed You may continue over-the-counter Tylenol Please follow-up with your PCP for further evaluation and treatment options Please go to the emergency room if any of your symptoms worsen

## 2022-05-28 NOTE — ED Triage Notes (Signed)
Pt presents with chronic neck pain and extremity numbness for past few years; pt states she is waiting for her primary to get her in with a neurologist but in the meantime she is dealing with a lot of pain & numbness.

## 2022-05-29 ENCOUNTER — Other Ambulatory Visit: Payer: Self-pay | Admitting: Student

## 2022-06-11 ENCOUNTER — Other Ambulatory Visit: Payer: Self-pay

## 2022-06-11 ENCOUNTER — Encounter (HOSPITAL_COMMUNITY): Payer: Self-pay | Admitting: Pharmacy Technician

## 2022-06-11 ENCOUNTER — Emergency Department (HOSPITAL_COMMUNITY): Payer: Medicaid Other

## 2022-06-11 ENCOUNTER — Emergency Department (HOSPITAL_COMMUNITY)
Admission: EM | Admit: 2022-06-11 | Discharge: 2022-06-11 | Disposition: A | Discharge status: Home / Self Care | Payer: Medicaid Other | Attending: Emergency Medicine | Admitting: Emergency Medicine

## 2022-06-11 DIAGNOSIS — Z21 Asymptomatic human immunodeficiency virus [HIV] infection status: Secondary | ICD-10-CM | POA: Diagnosis not present

## 2022-06-11 DIAGNOSIS — Z79899 Other long term (current) drug therapy: Secondary | ICD-10-CM | POA: Diagnosis not present

## 2022-06-11 DIAGNOSIS — M545 Low back pain, unspecified: Secondary | ICD-10-CM | POA: Diagnosis present

## 2022-06-11 DIAGNOSIS — I10 Essential (primary) hypertension: Secondary | ICD-10-CM | POA: Insufficient documentation

## 2022-06-11 DIAGNOSIS — D72829 Elevated white blood cell count, unspecified: Secondary | ICD-10-CM | POA: Diagnosis not present

## 2022-06-11 DIAGNOSIS — R109 Unspecified abdominal pain: Secondary | ICD-10-CM | POA: Diagnosis not present

## 2022-06-11 LAB — URINALYSIS, ROUTINE W REFLEX MICROSCOPIC
Bilirubin Urine: NEGATIVE
Glucose, UA: NEGATIVE mg/dL
Hgb urine dipstick: NEGATIVE
Ketones, ur: NEGATIVE mg/dL
Leukocytes,Ua: NEGATIVE
Nitrite: NEGATIVE
Protein, ur: NEGATIVE mg/dL
Specific Gravity, Urine: 1.015 (ref 1.005–1.030)
pH: 5 (ref 5.0–8.0)

## 2022-06-11 LAB — COMPREHENSIVE METABOLIC PANEL
ALT: 25 U/L (ref 0–44)
AST: 22 U/L (ref 15–41)
Albumin: 4 g/dL (ref 3.5–5.0)
Alkaline Phosphatase: 83 U/L (ref 38–126)
Anion gap: 10 (ref 5–15)
BUN: 15 mg/dL (ref 6–20)
CO2: 24 mmol/L (ref 22–32)
Calcium: 9.5 mg/dL (ref 8.9–10.3)
Chloride: 101 mmol/L (ref 98–111)
Creatinine, Ser: 0.96 mg/dL (ref 0.44–1.00)
GFR, Estimated: >60 mL/min (ref >60)
Glucose, Bld: 122 mg/dL — ABNORMAL HIGH (ref 70–99)
Potassium: 4 mmol/L (ref 3.5–5.1)
Sodium: 135 mmol/L (ref 135–145)
Total Bilirubin: 0.6 mg/dL (ref 0.3–1.2)
Total Protein: 7.9 g/dL (ref 6.5–8.1)

## 2022-06-11 LAB — I-STAT BETA HCG BLOOD, ED (MC, WL, AP ONLY): I-stat hCG, quantitative: <5 m[IU]/mL (ref <5)

## 2022-06-11 LAB — CBC
HCT: 37.5 % (ref 36.0–46.0)
Hemoglobin: 12.8 g/dL (ref 12.0–15.0)
MCH: 32.5 pg (ref 26.0–34.0)
MCHC: 34.1 g/dL (ref 30.0–36.0)
MCV: 95.2 fL (ref 80.0–100.0)
Platelets: 288 10*3/uL (ref 150–400)
RBC: 3.94 MIL/uL (ref 3.87–5.11)
RDW: 12.3 % (ref 11.5–15.5)
WBC: 13.8 10*3/uL — ABNORMAL HIGH (ref 4.0–10.5)
nRBC: 0 % (ref 0.0–0.2)

## 2022-06-11 LAB — LIPASE, BLOOD: Lipase: 39 U/L (ref 11–51)

## 2022-06-11 MED ORDER — IOHEXOL 350 MG/ML SOLN
75.0000 mL | Freq: Once | INTRAVENOUS | Status: AC | PRN
  Administered 2022-06-11: 75 mL via INTRAVENOUS

## 2022-06-11 MED ORDER — FENTANYL CITRATE PF 50 MCG/ML IJ SOSY
50.0000 ug | PREFILLED_SYRINGE | Freq: Once | INTRAMUSCULAR | Status: AC
  Administered 2022-06-11: 50 ug via INTRAVENOUS
  Filled 2022-06-11: qty 1

## 2022-06-11 MED ORDER — METHOCARBAMOL 500 MG PO TABS: 500.0000 mg | ORAL_TABLET | Freq: Two times a day (BID) | ORAL | 0 refills | Status: DC

## 2022-06-11 NOTE — ED Triage Notes (Signed)
Pt here with reports of 3 days of pain to back pain and abdominal pain. Denies NVD, dysuria.

## 2022-06-11 NOTE — ED Notes (Addendum)
Pt states she has numbness and tingling from her neck that she is being treated for. Pt walked to exam room from waiting room slowly. Pt denies known injury to back. Pt states she doesn't know why her back and abdomen started hurting

## 2022-06-11 NOTE — ED Provider Notes (Signed)
[facility] EMERGENCY DEPARTMENT Provider Note   [name]: [id number] Arrival date & time: 06/11/22  0735     History  Chief Complaint  Patient presents with   Back Pain    Vanessa Browning Vanessa Browning is a 57 y.o. female.  HPI 57 year old female history of HIV, on antiretroviral therapy, history of hypertension hypercholesterolemia presents today complaining of low back pain and abdominal pain.  States the pain began in the lower back.  Symptoms worsened with liquids but patient has been taking solids without difficulty.  She has not had fever, chills, UTI symptoms or previous similar symptoms.     Home Medications Prior to Admission medications   Medication Sig Start Date End Date Taking? Authorizing Provider  methocarbamol (ROBAXIN) 500 MG tablet Take 1 tablet (500 mg total) by mouth 2 (two) times daily. 06/11/22  Yes Pattricia Boss, MD  albuterol (VENTOLIN HFA) 108 (90 Base) MCG/ACT inhaler Inhale 1-2 puffs into the lungs every 6 (six) hours as needed for wheezing or shortness of breath. 02/10/21   Jaynee Eagles, PA-C  cyclobenzaprine (FLEXERIL) 10 MG tablet Take 1 tablet (10 mg total) by mouth 2 (two) times daily as needed for muscle spasms. 05/28/22   Melynda Ripple, NP  elvitegravir-cobicistat-emtricitabine-tenofovir (GENVOYA) 150-150-200-10 MG TABS tablet Take 1 tablet by mouth daily with breakfast. 04/09/22   Golden Circle, FNP  lisinopril-hydrochlorothiazide (ZESTORETIC) 20-12.5 MG tablet Take 1 tablet by mouth daily. 12/18/21 12/18/22  Katsadouros, Vasilios, MD  rosuvastatin (CRESTOR) 5 MG tablet TAKE 1 TABLET (5 MG TOTAL) BY MOUTH DAILY. 09/07/21 09/07/22  Mitzi Hansen, MD  varenicline (CHANTIX) 1 MG tablet TAKE 1 TABLET BY MOUTH TWICE A DAY 03/26/22   Johny Blamer, DO      Allergies    Dexilant [dexlansoprazole]    Review of Systems   Review of Systems  Physical Exam Updated Vital Signs BP 126/67 (BP Location: Left Arm)   Pulse 83   Temp 98.3 F (36.8 C)  (Oral)   Resp 18   SpO2 99%  Physical Exam Vitals and nursing note reviewed.  Constitutional:      Appearance: Normal appearance.  HENT:     Head: Normocephalic.     Right Ear: External ear normal.     Left Ear: External ear normal.     Nose: Nose normal.     Mouth/Throat:     Pharynx: Oropharynx is clear.  Eyes:     Pupils: Pupils are equal, round, and reactive to light.  Musculoskeletal:     Cervical back: Normal range of motion.  Neurological:     Mental Status: She is alert.     ED Results / Procedures / Treatments   Labs (all labs ordered are listed, but only abnormal results are displayed) Labs Reviewed  COMPREHENSIVE METABOLIC PANEL - Abnormal; Notable for the following components:      Result Value   Glucose, Bld 122 (*)    All other components within normal limits  CBC - Abnormal; Notable for the following components:   WBC 13.8 (*)    All other components within normal limits  URINALYSIS, ROUTINE W REFLEX MICROSCOPIC - Abnormal; Notable for the following components:   APPearance HAZY (*)    All other components within normal limits  LIPASE, BLOOD  I-STAT BETA HCG BLOOD, ED ([facility], [facility], [facility] ONLY)    EKG None  Radiology CT ABDOMEN PELVIS W CONTRAST  Result Date: 06/11/2022 CLINICAL DATA:  Acute non localized abdominal pain.  Back pain.  EXAM: CT ABDOMEN AND PELVIS WITH CONTRAST TECHNIQUE: Multidetector CT imaging of the abdomen and pelvis was performed using the standard protocol following bolus administration of intravenous contrast. RADIATION DOSE REDUCTION: This exam was performed according to the departmental dose-optimization program which includes automated exposure control, adjustment of the mA and/or kV according to patient size and/or use of iterative reconstruction technique. CONTRAST:  58m OMNIPAQUE IOHEXOL 350 MG/ML SOLN COMPARISON:  CT abdomen and pelvis 11/04/2009 FINDINGS: Lower chest: Mild curvilinear subsegmental atelectasis versus scarring within the  bilateral lower lobes. Mild scattered right lower lobe bullae, similar to prior on the prior overlapping images. Hepatobiliary: Smooth liver contours. There is diffuse decreased density again seen throughout the liver suggesting fatty infiltration. The gallbladder again appears to be surgically absent. Unchanged 10 mm benign cyst within the central liver adjacent to the common bile duct. Minimal central intrahepatic biliary ductal dilatation previously seen appears nearly resolved on the current study. Pancreas: No mass or inflammatory fat stranding. The downstream pancreatic duct is again mildly dilated, measuring up to 4 mm in caliber, unchanged from 11/04/2009. Spleen: Normal in size without focal abnormality. Adrenals/Urinary Tract: Adrenal glands are unremarkable. The kidneys enhance uniformly and are symmetric in size without hydronephrosis. No renal stone is seen. No renal mass is seen. The urinary bladder is only minimally distended, limiting evaluation. Stomach/Bowel: Resolution of the inflammatory fat stranding just anterior to the proximal sigmoid colon seen on prior remote 11/04/2009 CT. The terminal ileum is unremarkable. The appendix is not confidently identified, however no inflammatory changes are seen around the cecum to indicate secondary signs of acute appendicitis. There may be a tiny stump of a normal appearing appendix without inflammatory changes seen at the posterior aspect of the cecum (axial series 3, image 68 and coronal series 6, image 43). No dilated loops of bowel to indicate bowel obstruction. Vascular/Lymphatic: No abdominal aortic aneurysm. The major intra-abdominal aortic branch vessels are patent. Mild to moderate atherosclerotic calcifications. No mesenteric, retroperitoneal, or pelvic lymphadenopathy. MVC splenule within the left upper quadrant measuring up to 6 mm (axial series 3, image 23) is unchanged from 11/04/2009. Reproductive: The uterus is present. No gross adnexal  abnormality is visualized. Other: Right anterior abdominal wall postsurgical changes are not significantly changed, possibly prior hernia repair. There is associated relative atrophy of the right rectus abdominus muscle in this region.No abdominopelvic ascites. No pneumoperitoneum. Musculoskeletal: No acute or significant osseous findings. IMPRESSION: 1. No acute abnormality is seen within the abdomen or pelvis. 2. Diffuse fatty infiltration of the liver. 3. Status post cholecystectomy. Electronically Signed   By: RYvonne KendallM.D.   On: 06/11/2022 13:58    Procedures Procedures    Medications Ordered in ED Medications  fentaNYL (SUBLIMAZE) injection 50 mcg (50 mcg Intravenous Given 06/11/22 1301)  iohexol (OMNIPAQUE) 350 MG/ML injection 75 mL (75 mLs Intravenous Contrast Given 06/11/22 1331)    ED Course/ Medical Decision Making/ A&P Clinical Course as of 06/11/22 1411  Mon Jun 11, 2022  1404 Urinalysis reviewed interpreted and within normal limits no evidence of acute infection or hematuria noted [DR]  1404 CBC reviewed interpreted significant for mild leukocytosis of 13,800 otherwise within normal limits [DR]  15329Complete metabolic panel reviewed interpreted significant for mild hyperglycemia with glucose of 122 otherwise within normal limits [DR]  1405 CT abdomen pelvis reviewed and no evidence of acute abnormalities are noted within the abdomen or pelvis there is diffuse fatty infiltration of the liver [DR]    Clinical Course User  Index [DR] Pattricia Boss, MD                           Medical Decision Making 57 year old female history of HIV on antiretrovirals presents today complaining of low back pain radiating to her suprapubic area.  Patient denies any vaginal discharge.  She has been having nausea with fluids but taking p.o. without difficulty She was evaluated here with labs and CT scan.  Urinalysis obtained all within normal limits the exception of some leukocytosis at 13,000.   Patient was treated here with pain medicine. Prescriptions given for Robaxin  Amount and/or Complexity of Data Reviewed Labs: ordered. Decision-making details documented in ED Course. Radiology: ordered and independent interpretation performed. Decision-making details documented in ED Course.  Risk Prescription drug management.          Final Clinical Impression(s) / ED Diagnoses Final diagnoses:  Acute low back pain without sciatica, unspecified back pain laterality    Rx / DC Orders ED Discharge Orders          Ordered    methocarbamol (ROBAXIN) 500 MG tablet  2 times daily        06/11/22 1409              Pattricia Boss, MD 06/11/22 1411

## 2022-06-11 NOTE — Discharge Instructions (Addendum)
Please take Robaxin as needed for pain You were seen here in the ED for low back pain radiating into your lower abdomen.  You were evaluated with labs and urinalysis and CAT scan.  No definite source of pain was seen and no evidence of acute infection was noted. Please use Robaxin as needed for back pain Return to the emergency department if your pain is worsening and you have other symptoms such as fever, or inability to tolerate medications or liquids Please follow-up with your physician on Wednesday as scheduled

## 2022-06-13 ENCOUNTER — Ambulatory Visit: Payer: Medicaid Other

## 2022-06-13 ENCOUNTER — Ambulatory Visit (INDEPENDENT_AMBULATORY_CARE_PROVIDER_SITE_OTHER): Payer: Medicaid Other | Admitting: Student

## 2022-06-13 ENCOUNTER — Encounter: Payer: Self-pay | Admitting: Student

## 2022-06-13 VITALS — BP 116/64 | HR 84 | Temp 98.2°F | Ht 64.0 in | Wt 193.1 lb

## 2022-06-13 DIAGNOSIS — G8929 Other chronic pain: Secondary | ICD-10-CM | POA: Diagnosis not present

## 2022-06-13 DIAGNOSIS — M5020 Other cervical disc displacement, unspecified cervical region: Secondary | ICD-10-CM

## 2022-06-13 DIAGNOSIS — M545 Low back pain, unspecified: Secondary | ICD-10-CM | POA: Diagnosis not present

## 2022-06-13 MED ORDER — VENLAFAXINE HCL ER 37.5 MG PO CP24: 37.5000 mg | ORAL_CAPSULE | Freq: Every day | ORAL | 1 refills | Status: DC

## 2022-06-13 MED ORDER — GABAPENTIN 100 MG PO CAPS: 100.0000 mg | ORAL_CAPSULE | Freq: Three times a day (TID) | ORAL | 1 refills | Status: DC

## 2022-06-13 NOTE — Patient Instructions (Signed)
Thank you, [name] for allowing [place] to provide your care today. Today we discussed.  Neck pain We will be following up on your MRI neck. If you do not here from our office to schedule this in the next 2 weeks, please call us.   Low back pain I believe you are having an exacerbation of your low back pain. Please take extra strength tylenol three times a day, warm compresses, and I will be referring you to physical therapy. I would like to see you back in one month to see how you are doing.  If this worseness or you develop worsening numbness tingling, have fevers chills etc please call our clinic immediately.   I have ordered the following labs for you:  Lab Orders  No laboratory test(s) ordered today      Referrals ordered today:   Referral Orders         Ambulatory referral to Physical Therapy       I have ordered the following medication/changed the following medications:   Stop the following medications: There are no discontinued medications.   Start the following medications: No orders of the defined types were placed in this encounter.    Follow up:  1 month follow up for neck and back pain or sooner if need be     Should you have any questions or concerns please call the internal medicine clinic at [phone].    Sanjuana Letters, D.O. [place]

## 2022-06-14 NOTE — Assessment & Plan Note (Signed)
Hx of chronic low back pain with sciatica, currently exacerbated for the past 3-4 days. Has tried multiple doses of OTC back and body (NSAIDS) without relief. Denies any injury to the area. She has tenderness across her lower back, no focal midline tenderness. Denies symptoms of loss of bowel/bladder function, rectal numbness/tingling, fevers, chills, or hx of malignancy. Her HIV is well controlled. On exam has tenderness across paraspinal muscles. Straight leg test negative.  Likely exacerbation of chronic low back pain. Would continue tylenol, gabapentin, hold nsaids since she has taken high doses last few days. Effexor was started today for her menopausal symptoms, if no improvement with this can consider transitioning to Cymbalta for chronic low back pain. Do not believe she warrants imaging at this time.  -gabapentin 100 mg Tid, tylenol PRN -PT referral placed -if pain persists consider imaging

## 2022-06-14 NOTE — Progress Notes (Signed)
CC: Neck and low back pain  HPI:  [name] is a 57 y.o. female living with a history stated below and presents today for follow up of her chronic neck pain with radicular symptoms and acute on chronic low back pain. Please see problem based assessment and plan for additional details.  Past Medical History:  Diagnosis Date   Allergy    seasonal   GERD (gastroesophageal reflux disease)    HIV (human immunodeficiency virus infection) ([place])    Hyperlipidemia    Kidney stone    Substance abuse ([place])     Current Outpatient Medications on File Prior to Visit  Medication Sig Dispense Refill   albuterol (VENTOLIN HFA) 108 (90 Base) MCG/ACT inhaler Inhale 1-2 puffs into the lungs every 6 (six) hours as needed for wheezing or shortness of breath. 18 g 0   cyclobenzaprine (FLEXERIL) 10 MG tablet Take 1 tablet (10 mg total) by mouth 2 (two) times daily as needed for muscle spasms. 10 tablet 0   elvitegravir-cobicistat-emtricitabine-tenofovir (GENVOYA) 150-150-200-10 MG TABS tablet Take 1 tablet by mouth daily with breakfast. 30 tablet 6   lisinopril-hydrochlorothiazide (ZESTORETIC) 20-12.5 MG tablet Take 1 tablet by mouth daily. 30 tablet 11   rosuvastatin (CRESTOR) 5 MG tablet TAKE 1 TABLET (5 MG TOTAL) BY MOUTH DAILY. 90 tablet 1   varenicline (CHANTIX) 1 MG tablet TAKE 1 TABLET BY MOUTH TWICE A DAY 180 tablet 1   No current facility-administered medications on file prior to visit.    Family History  Problem Relation Age of Onset   Diabetes Brother    Hypertension Brother    Arthritis Mother    Healthy Father    Stomach cancer Neg Hx    Rectal cancer Neg Hx    Colon cancer Neg Hx    Colon polyps Neg Hx     Social History   Socioeconomic History   Marital status: Unknown    Spouse name: Not on file   Number of children: Not on file   Years of education: Not on file   Highest education level: Not on file  Occupational History   Occupation: PCA  Tobacco Use    Smoking status: Every Day    Packs/day: 0.10    Years: 30.00    Total pack years: 3.00    Types: Cigarettes   Smokeless tobacco: Never   Tobacco comments:    "cutting back," patient is ready to quit, takes about a week to go through a pack  Vaping Use   Vaping Use: Former  Substance and Sexual Activity   Alcohol use: Yes    Comment: occasional   Drug use: No    Comment: greater than 35 years ago   Sexual activity: Not Currently    Partners: Male    Comment: declined condoms  Other Topics Concern   Not on file  Social History Narrative   ** Merged History Encounter **       Social Determinants of Health   Financial Resource Strain: Low Risk  (07/29/2017)   Overall Financial Resource Strain (CARDIA)    Difficulty of Paying Living Expenses: Not hard at all  Food Insecurity: No Food Insecurity (07/29/2017)   Hunger Vital Sign    Worried About Running Out of Food in the Last Year: Never true    Ran Out of Food in the Last Year: Never true  Transportation Needs: No Transportation Needs (07/29/2017)   PRAPARE - Hydrologist (Medical):  No    Lack of Transportation (Non-Medical): No  Physical Activity: Not on file  Stress: Not on file  Social Connections: Not on file  Intimate Partner Violence: Not on file    Review of Systems: ROS negative except for what is noted on the assessment and plan.  Vitals:   06/13/22 1504  BP: 116/64  Pulse: 84  Temp: 98.2 F (36.8 C)  TempSrc: Oral  SpO2: 99%  Weight: 193 lb 1.6 oz (87.6 kg)  Height: '5\' 4"'$  (1.626 m)    Physical Exam: Constitutional: in no acute distress HENT: normocephalic atraumatic,  Eyes: conjunctiva non-erythematous Neck: supple Cardiovascular: regular rate and rhythm, no m/r/g Pulmonary/Chest: normal work of breathing on room air MSK: normal bulk and tone. Spurling test negative. Straight leg test negative. Tender to palpate across paraspinal muscles, no focal vertebral tenderness. No  vertebral deformity.  Neurological: alert & oriented x 3. Normal sensation in extremities Skin: warm and dry Psych: normal mood   Assessment & Plan:   Cervical herniated disc Continues to have ongoing neck pain with bilateral radicular symptoms. She was scheduled for an MRI 2 months ago however this was never completed, she states she never received a phone call. On exam she has a negative spurling test. She has normal sensation. Prior plan was to obtain MRI and then referral to ortho vs neurosurgery. Will continue with plan for MRI. Will trial gabapentin for her radicular pain, she has been taking high doses of NSAIDS for the past few days.  -MRI cervical spine pending -trial gabapentin 100 mg TID  Chronic low back pain without sciatica Hx of chronic low back pain with sciatica, currently exacerbated for the past 3-4 days. Has tried multiple doses of OTC back and body (NSAIDS) without relief. Denies any injury to the area. She has tenderness across her lower back, no focal midline tenderness. Denies symptoms of loss of bowel/bladder function, rectal numbness/tingling, fevers, chills, or hx of malignancy. Her HIV is well controlled. On exam has tenderness across paraspinal muscles. Straight leg test negative.  Likely exacerbation of chronic low back pain. Would continue tylenol, gabapentin, hold nsaids since she has taken high doses last few days. Effexor was started today for her menopausal symptoms, if no improvement with this can consider transitioning to Cymbalta for chronic low back pain. Do not believe she warrants imaging at this time.  -gabapentin 100 mg Tid, tylenol PRN -PT referral placed -if pain persists consider imaging  Patient discussed with Dr. Caffie Damme, D.O. [facility], PGY-3 Phone: [phone] Date 06/14/2022 Time 1:52 PM

## 2022-06-14 NOTE — Assessment & Plan Note (Signed)
Continues to have ongoing neck pain with bilateral radicular symptoms. She was scheduled for an MRI 2 months ago however this was never completed, she states she never received a phone call. On exam she has a negative spurling test. She has normal sensation. Prior plan was to obtain MRI and then referral to ortho vs neurosurgery. Will continue with plan for MRI. Will trial gabapentin for her radicular pain, she has been taking high doses of NSAIDS for the past few days.  -MRI cervical spine pending -trial gabapentin 100 mg TID

## 2022-06-20 ENCOUNTER — Other Ambulatory Visit: Payer: Self-pay

## 2022-06-20 ENCOUNTER — Ambulatory Visit: Payer: Medicaid Other | Attending: Internal Medicine

## 2022-06-20 DIAGNOSIS — M545 Low back pain, unspecified: Secondary | ICD-10-CM | POA: Insufficient documentation

## 2022-06-20 DIAGNOSIS — M6281 Muscle weakness (generalized): Secondary | ICD-10-CM | POA: Insufficient documentation

## 2022-06-20 DIAGNOSIS — G8929 Other chronic pain: Secondary | ICD-10-CM | POA: Insufficient documentation

## 2022-06-20 DIAGNOSIS — R293 Abnormal posture: Secondary | ICD-10-CM
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
## 2022-06-21 NOTE — Addendum Note (Signed)
Addended by: Aldine Contes on: 06/21/2022 11:30 AM   Modules accepted: Level of Service

## 2022-06-21 NOTE — Progress Notes (Signed)
Internal Medicine Clinic Attending  Case discussed with Dr. Katsadouros  At the time of the visit.  We reviewed the resident's history and exam and pertinent patient test results.  I agree with the assessment, diagnosis, and plan of care documented in the resident's note.  

## 2022-06-26 ENCOUNTER — Telehealth: Payer: Self-pay | Admitting: Physical Therapy

## 2022-06-26 ENCOUNTER — Ambulatory Visit: Payer: Medicaid Other | Admitting: Physical Therapy

## 2022-06-26 NOTE — Telephone Encounter (Signed)
Contacted Vanessa Browning regarding missed appointment. She reports that she forgot about the appointment. She plans to attend her next appointment on 06/28/22.

## 2022-06-27 NOTE — Therapy (Signed)
OUTPATIENT PHYSICAL THERAPY TREATMENT NOTE   Patient Name: Vanessa Browning MRN: [id number] DOB:Feb 11, 1966, 57 y.o., female Today's Date: 06/28/2022  PCP: Johny Blamer, DO   REFERRING PROVIDER: Aldine Contes, MD   END OF SESSION:   PT End of Session - 06/28/22 0931     Visit Number 2    Number of Visits 13    Date for PT Re-Evaluation 08/10/22    Authorization Type  MEDICAID [employer]    PT Start Time 0848    PT Stop Time 0930    PT Time Calculation (min) 42 min    Activity Tolerance Patient tolerated treatment well    Behavior During Therapy WFL for tasks assessed/performed             Past Medical History:  Diagnosis Date   Allergy    seasonal   GERD (gastroesophageal reflux disease)    HIV (human immunodeficiency virus infection) ([place])    Hyperlipidemia    Kidney stone    Substance abuse ([place])    Past Surgical History:  Procedure Laterality Date   APPENDECTOMY     APPENDECTOMY     CHOLECYSTECTOMY     INDUCED ABORTION     TONSILLECTOMY     Patient Active Problem List   Diagnosis Date Noted   History of hyperglycemia 12/18/2021   Chronic low back pain without sciatica 07/24/2021   Generalized anxiety disorder 02/12/2020   Essential hypertension 02/12/2020   Healthcare maintenance 09/04/2018   LGSIL on Pap smear of cervix 11/06/2017   Obesity, Class I, BMI 30-34.9 01/20/2016   Hyperlipidemia 10/01/2012   Tobacco abuse 05/31/2011   Cervical herniated disc 12/09/2008   Mononeuropathy 08/19/2008   Human immunodeficiency virus (HIV) disease ([place]) 01/13/2007   Allergic rhinitis 01/13/2007    REFERRING DIAG: M54.50,G89.29 (ICD-10-CM) - Chronic bilateral low back pain without sciatica    THERAPY DIAG:  Chronic bilateral low back pain without sciatica  Muscle weakness (generalized)  Abnormal posture  Rationale for Evaluation and Treatment Rehabilitation  ONSET DATE: Approx 1 month ago   SUBJECTIVE:                                                                                                                                                                                             SUBJECTIVE STATEMENT: Pt reports she has been completing her HEP and it has helped her low back pain. "Pain is less more often".   PERTINENT HISTORY:  HIV, substance abuse, high BMI   PAIN:  Are you having pain? Yes: NPRS scale: 0/10; pain range 0-7/10 Pain location: Across her low back Pain description: sharp,  burning, intermittent Aggravating factors: It hurts when it wants too. Relieving factors: Gabapentin and tylenol 0/10 with medication. 0-10/10 pain range on eval   PRECAUTIONS: Other: HIV   WEIGHT BEARING RESTRICTIONS: No   FALLS:  Has patient fallen in last 6 months? No   LIVING ENVIRONMENT: Lives with: lives alone Lives in: House/apartment Pt reports no issue with accessing her home or mobility within   OCCUPATION: Online school   PLOF: Independent with basic ADLs   PATIENT GOALS: To have less pain   NEXT MD VISIT:    OBJECTIVE: (objective measures completed at initial evaluation unless otherwise dated)     DIAGNOSTIC FINDINGS:  NA for lumbar spine   PATIENT SURVEYS:  Modified Oswestry 31/50, 62%- mod disability    SCREENING FOR RED FLAGS: Bowel or bladder incontinence: No   COGNITION: Overall cognitive status: Within functional limits for tasks assessed                          SENSATION: WFL   MUSCLE LENGTH: Hamstrings: Right WNLs deg; Left WNLs deg Marcello Moores test: Right Tight deg; Left Tight deg   POSTURE: rounded shoulders, forward head, increased lumbar lordosis, anterior pelvic tilt, and CT step off   PALPATION: Not TTP in the low back region   LUMBAR ROM:    AROM eval  Flexion Min limitation, decreased mobility of the lumbar spine c lordosis not fully flattening.  Extension Min limitation, LBP provoked  Right lateral flexion Full, not provoked  Left lateral flexion Full,  not provoked  Right rotation Full, not provoked  Left rotation Full, not provoked   (Blank rows = not tested)   LOWER EXTREMITY ROM:    Grossly WNLs Active  Right eval Left eval  Hip flexion      Hip extension      Hip abduction      Hip adduction      Hip internal rotation      Hip external rotation      Knee flexion      Knee extension      Ankle dorsiflexion      Ankle plantarflexion      Ankle inversion      Ankle eversion       (Blank rows = not tested)   LOWER EXTREMITY MMT:   Myotome screen negative. Weak core MMT Right eval Left eval  Hip flexion      Hip extension      Hip abduction      Hip adduction      Hip internal rotation      Hip external rotation      Knee flexion      Knee extension      Ankle dorsiflexion      Ankle plantarflexion      Ankle inversion      Ankle eversion       (Blank rows = not tested)   LUMBAR SPECIAL TESTS:  Straight leg raise test: Negative, Slump test: Negative, SI Compression/distraction test: Negative, and FABER test: Negative   FUNCTIONAL TESTS:  5 times sit to stand: TBA   GAIT: Distance walked: 271f Assistive device utilized: None Level of assistance: Complete Independence Comments: WFLs   TODAY'S TREATMENT:    [facility] Adult PT Treatment:  DATE: 06/28/22 Therapeutic Exercise: Nustep 5 min L4 UE/LE Seated hamstring stretch x2 20" each SKTC x3 10" each LTR x5 5" each PPT x 10 3" Marching with PPT 2x10 Bridging c PPT 2x10 Updated HEP                                                                                                                             [facility] Adult PT Treatment:                                                DATE: 06/20/22 Therapeutic Exercise: Developed, instructed in, and pt completed therex as noted in HEP   PATIENT EDUCATION:  Education details: Eval findings, POC, HEP Person educated: Patient Education method: Explanation, Demonstration,  Tactile cues, Verbal cues, and Handouts Education comprehension: verbalized understanding, returned demonstration, verbal cues required, and tactile cues required   HOME EXERCISE PROGRAM: Access Code: [email or web address] URL: [email or web address] Date: 06/28/2022 Prepared by: Gar Ponto  Exercises - Seated Hamstring Stretch  - 2 x daily - 7 x weekly - 3 sets - 10 reps - Hooklying Single Knee to Chest Stretch  - 2 x daily - 7 x weekly - 1 sets - 5 reps - 10 hold - Supine Lower Trunk Rotation  - 2 x daily - 7 x weekly - 1 sets - 10 reps - Supine Posterior Pelvic Tilt  - 2 x daily - 7 x weekly - 1 sets - 10 reps - 3 hold - Supine March  - 2 x daily - 7 x weekly - 2 sets - 10 reps - Supine Bridge  - 2 x daily - 7 x weekly - 2 sets - 10 reps - 3 hold   ASSESSMENT:   CLINICAL IMPRESSION: Patient returns to PT after the eval. Pt reports her low back pain is improving. Additional therex were completed today to address hamstring flexibility and to continue address core strength. Initial response to PT has been positive. Pt tolerated PT today without adverse effects. Pt will continue to benefit from skilled PT to address impairments for improved function c less pain.   OBJECTIVE IMPAIRMENTS: decreased activity tolerance, difficulty walking, decreased ROM, decreased strength, impaired flexibility, postural dysfunction, obesity, and pain.    ACTIVITY LIMITATIONS: lifting, bending, standing, and locomotion level   PARTICIPATION LIMITATIONS: meal prep, cleaning, laundry, and occupation   PERSONAL FACTORS: Fitness, Past/current experiences, Time since onset of injury/illness/exacerbation, and 1-2 comorbidities: high BMI, substance abuse  are also affecting patient's functional outcome.    REHAB POTENTIAL: Good   CLINICAL DECISION MAKING: Stable/uncomplicated   EVALUATION COMPLEXITY: Low     GOALS:   SHORT TERM GOALS:   Pt will be Ind in an initial HEP Baseline: initiated Status:  06/28/22- pt reports completing Goal status: ongoing   2.  Pt will voice understanding  of measures to assist in pain reduction  Baseline: HEP started Goal status: INITIAL     LONG TERM GOALS: Target date: 08/10/22   Pt will be Ind in a final HEP to maintain achieved LOF  Baseline: initiated Goal status: INITIAL   2.  Pt will demonstrated improved lumbar flexion ROM with forward flexion Baseline: Decreased Goal status: INITIAL   3.  Pt will demonstrate improved core strength maintaining a bridge for 60" and a plank from knees for 30" Baseline: TBA Goal status: INITIAL   4.  Improve 5xSTS by MCID of 5" as indication of improved functional mobility  Baseline: TBA Goal status: INITIAL   5.  Pt will report a decrease in her LBP range to 3/10 or less with daily activities Baseline: 0-10/10 Goal status: INITIAL   6.  Pt 's MODI will improve to 38% disability as indication of improve function Baseline: 62% disability Goal status: INITIAL   PLAN:   PT FREQUENCY: 2x/week   PT DURATION: 6 weeks   PLANNED INTERVENTIONS: Therapeutic exercises, Therapeutic activity, Patient/Family education, Self Care, Dry Needling, Electrical stimulation, Spinal manipulation, Spinal mobilization, Cryotherapy, Moist heat, Taping, Manual therapy, and Re-evaluation.   PLAN FOR NEXT SESSION: Review MODI; complete core strength assessment and 5xSTS; assess response to HEP; progress therex as indicated; use of modalities, manual therapy; and TPDN as indicated.   Winola Drum MS, PT 06/28/22 9:33 AM

## 2022-06-28 ENCOUNTER — Ambulatory Visit: Payer: Medicaid Other

## 2022-06-28 DIAGNOSIS — M6281 Muscle weakness (generalized): Secondary | ICD-10-CM

## 2022-06-28 DIAGNOSIS — M545 Low back pain, unspecified: Secondary | ICD-10-CM | POA: Diagnosis not present

## 2022-06-28 DIAGNOSIS — R293 Abnormal posture: Secondary | ICD-10-CM

## 2022-07-03 ENCOUNTER — Ambulatory Visit: Payer: Medicaid Other | Admitting: Physical Therapy

## 2022-07-05 ENCOUNTER — Telehealth: Payer: Self-pay

## 2022-07-05 ENCOUNTER — Ambulatory Visit: Payer: Medicaid Other

## 2022-07-05 NOTE — Therapy (Incomplete)
OUTPATIENT PHYSICAL THERAPY TREATMENT NOTE   Patient Name: Vanessa Browning MRN: [id number] DOB:23-May-1966, 57 y.o., female Today's Date: 07/05/2022  PCP: Johny Blamer, DO   REFERRING PROVIDER: Aldine Contes, MD   END OF SESSION:     Past Medical History:  Diagnosis Date   Allergy    seasonal   GERD (gastroesophageal reflux disease)    HIV (human immunodeficiency virus infection) ([place])    Hyperlipidemia    Kidney stone    Substance abuse [facility])    Past Surgical History:  Procedure Laterality Date   APPENDECTOMY     APPENDECTOMY     CHOLECYSTECTOMY     INDUCED ABORTION     TONSILLECTOMY     Patient Active Problem List   Diagnosis Date Noted   History of hyperglycemia 12/18/2021   Chronic low back pain without sciatica 07/24/2021   Generalized anxiety disorder 02/12/2020   Essential hypertension 02/12/2020   Healthcare maintenance 09/04/2018   LGSIL on Pap smear of cervix 11/06/2017   Obesity, Class I, BMI 30-34.9 01/20/2016   Hyperlipidemia 10/01/2012   Tobacco abuse 05/31/2011   Cervical herniated disc 12/09/2008   Mononeuropathy 08/19/2008   Human immunodeficiency virus (HIV) disease ([place]) 01/13/2007   Allergic rhinitis 01/13/2007    REFERRING DIAG: M54.50,G89.29 (ICD-10-CM) - Chronic bilateral low back pain without sciatica    THERAPY DIAG:  No diagnosis found.  Rationale for Evaluation and Treatment Rehabilitation  ONSET DATE: Approx 1 month ago   SUBJECTIVE:                                                                                                                                                                                            SUBJECTIVE STATEMENT: Pt reports she has been completing her HEP and it has helped her low back pain. "Pain is less more often".   PERTINENT HISTORY:  HIV, substance abuse, high BMI   PAIN:  Are you having pain? Yes: NPRS scale: 0/10; pain range 0-7/10 Pain location: Across her low back Pain  description: sharp, burning, intermittent Aggravating factors: It hurts when it wants too. Relieving factors: Gabapentin and tylenol 0/10 with medication. 0-10/10 pain range on eval   PRECAUTIONS: Other: HIV   WEIGHT BEARING RESTRICTIONS: No   FALLS:  Has patient fallen in last 6 months? No   LIVING ENVIRONMENT: Lives with: lives alone Lives in: House/apartment Pt reports no issue with accessing her home or mobility within   OCCUPATION: Online school   PLOF: Independent with basic ADLs   PATIENT GOALS: To have less pain   NEXT MD VISIT:    OBJECTIVE: (objective measures completed at  initial evaluation unless otherwise dated)     DIAGNOSTIC FINDINGS:  NA for lumbar spine   PATIENT SURVEYS:  Modified Oswestry 31/50, 62%- mod disability    SCREENING FOR RED FLAGS: Bowel or bladder incontinence: No   COGNITION: Overall cognitive status: Within functional limits for tasks assessed                          SENSATION: WFL   MUSCLE LENGTH: Hamstrings: Right WNLs deg; Left WNLs deg Marcello Moores test: Right Tight deg; Left Tight deg   POSTURE: rounded shoulders, forward head, increased lumbar lordosis, anterior pelvic tilt, and CT step off   PALPATION: Not TTP in the low back region   LUMBAR ROM:    AROM eval  Flexion Min limitation, decreased mobility of the lumbar spine c lordosis not fully flattening.  Extension Min limitation, LBP provoked  Right lateral flexion Full, not provoked  Left lateral flexion Full, not provoked  Right rotation Full, not provoked  Left rotation Full, not provoked   (Blank rows = not tested)   LOWER EXTREMITY ROM:    Grossly WNLs Active  Right eval Left eval  Hip flexion      Hip extension      Hip abduction      Hip adduction      Hip internal rotation      Hip external rotation      Knee flexion      Knee extension      Ankle dorsiflexion      Ankle plantarflexion      Ankle inversion      Ankle eversion       (Blank rows =  not tested)   LOWER EXTREMITY MMT:   Myotome screen negative. Weak core MMT Right eval Left eval  Hip flexion      Hip extension      Hip abduction      Hip adduction      Hip internal rotation      Hip external rotation      Knee flexion      Knee extension      Ankle dorsiflexion      Ankle plantarflexion      Ankle inversion      Ankle eversion       (Blank rows = not tested)   LUMBAR SPECIAL TESTS:  Straight leg raise test: Negative, Slump test: Negative, SI Compression/distraction test: Negative, and FABER test: Negative   FUNCTIONAL TESTS:  5 times sit to stand: TBA   GAIT: Distance walked: 247f Assistive device utilized: None Level of assistance: Complete Independence Comments: WFLs   [facility] Therapeutic Exercise: *** Manual Therapy: *** Neuromuscular re-ed: *** Therapeutic Activity: *** Modalities: *** Self Care: ***  [facility]                                                DATE: 06/28/22 Therapeutic Exercise: Nustep 5 min L4 UE/LE Seated hamstring stretch x2 20"  each SKTC x3 10" each LTR x5 5" each PPT x 10 3" Marching with PPT 2x10 Bridging c PPT 2x10 Updated HEP                                                                                                                             [facility] Adult PT Treatment:                                                DATE: 06/20/22 Therapeutic Exercise: Developed, instructed in, and pt completed therex as noted in HEP   PATIENT EDUCATION:  Education details: Eval findings, POC, HEP Person educated: Patient Education method: Explanation, Demonstration, Tactile cues, Verbal cues, and Handouts Education comprehension: verbalized understanding, returned demonstration, verbal cues required, and tactile cues required   HOME EXERCISE PROGRAM: Access Code: [email or web address] URL:  [email or web address] Date: 06/28/2022 Prepared by: Gar Ponto  Exercises - Seated Hamstring Stretch  - 2 x daily - 7 x weekly - 3 sets - 10 reps - Hooklying Single Knee to Chest Stretch  - 2 x daily - 7 x weekly - 1 sets - 5 reps - 10 hold - Supine Lower Trunk Rotation  - 2 x daily - 7 x weekly - 1 sets - 10 reps - Supine Posterior Pelvic Tilt  - 2 x daily - 7 x weekly - 1 sets - 10 reps - 3 hold - Supine March  - 2 x daily - 7 x weekly - 2 sets - 10 reps - Supine Bridge  - 2 x daily - 7 x weekly - 2 sets - 10 reps - 3 hold   ASSESSMENT:   CLINICAL IMPRESSION: Patient returns to PT after the eval. Pt reports her low back pain is improving. Additional therex were completed today to address hamstring flexibility and to continue address core strength. Initial response to PT has been positive. Pt tolerated PT today without adverse effects. Pt will continue to benefit from skilled PT to address impairments for improved function c less pain.   OBJECTIVE IMPAIRMENTS: decreased activity tolerance, difficulty walking, decreased ROM, decreased strength, impaired flexibility, postural dysfunction, obesity, and pain.    ACTIVITY LIMITATIONS: lifting, bending, standing, and locomotion level   PARTICIPATION LIMITATIONS: meal prep, cleaning, laundry, and occupation   PERSONAL FACTORS: Fitness, Past/current experiences, Time since onset of injury/illness/exacerbation, and 1-2 comorbidities: high BMI, substance abuse  are also affecting patient's functional outcome.    REHAB POTENTIAL: Good   CLINICAL DECISION MAKING: Stable/uncomplicated   EVALUATION COMPLEXITY: Low     GOALS:   SHORT TERM GOALS:   Pt will be Ind in an initial HEP Baseline: initiated Status: 06/28/22- pt reports completing Goal status: ongoing   2.  Pt will voice understanding of measures to assist in pain reduction  Baseline: HEP started Goal status: INITIAL  LONG TERM GOALS: Target date: 08/10/22    Pt will be Ind in a final HEP to maintain achieved LOF  Baseline: initiated Goal status: INITIAL   2.  Pt will demonstrated improved lumbar flexion ROM with forward flexion Baseline: Decreased Goal status: INITIAL   3.  Pt will demonstrate improved core strength maintaining a bridge for 60" and a plank from knees for 30" Baseline: TBA Goal status: INITIAL   4.  Improve 5xSTS by MCID of 5" as indication of improved functional mobility  Baseline: TBA Goal status: INITIAL   5.  Pt will report a decrease in her LBP range to 3/10 or less with daily activities Baseline: 0-10/10 Goal status: INITIAL   6.  Pt 's MODI will improve to 38% disability as indication of improve function Baseline: 62% disability Goal status: INITIAL   PLAN:   PT FREQUENCY: 2x/week   PT DURATION: 6 weeks   PLANNED INTERVENTIONS: Therapeutic exercises, Therapeutic activity, Patient/Family education, Self Care, Dry Needling, Electrical stimulation, Spinal manipulation, Spinal mobilization, Cryotherapy, Moist heat, Taping, Manual therapy, and Re-evaluation.   PLAN FOR NEXT SESSION: Review MODI; complete core strength assessment and 5xSTS; assess response to HEP; progress therex as indicated; use of modalities, manual therapy; and TPDN as indicated.   Cullin Dishman MS, PT 07/05/22 6:08 AM

## 2022-07-05 NOTE — Telephone Encounter (Signed)
LVM re: no show appt for today. Today is the pt's last scheduled appt. Pt was advised she can call the clinic to schedule another appt if she wishes to continue with PT. Pt was notified she would be DCed from services if an appt was not rescheduled within 2 weeks.

## 2022-07-06 ENCOUNTER — Other Ambulatory Visit: Payer: Self-pay | Admitting: Student

## 2022-07-16 ENCOUNTER — Encounter: Payer: Medicaid Other | Admitting: Student

## 2022-07-20 ENCOUNTER — Encounter: Payer: Medicaid Other | Admitting: Student

## 2022-07-25 ENCOUNTER — Encounter: Payer: Medicaid Other | Admitting: Student

## 2022-08-06 ENCOUNTER — Ambulatory Visit (INDEPENDENT_AMBULATORY_CARE_PROVIDER_SITE_OTHER): Payer: Medicaid Other | Admitting: Student

## 2022-08-06 VITALS — BP 129/80 | HR 81 | Temp 98.0°F | Ht 64.0 in | Wt 190.6 lb

## 2022-08-06 DIAGNOSIS — N644 Mastodynia: Secondary | ICD-10-CM

## 2022-08-06 DIAGNOSIS — M5020 Other cervical disc displacement, unspecified cervical region: Secondary | ICD-10-CM | POA: Diagnosis not present

## 2022-08-06 NOTE — Patient Instructions (Signed)
Thank you so much for coming to the clinic today!   I am sending a referral for PT for your neck specifically, and we are also going to go ahead and get a mammogram just to make sure there is nothing serious going on in your breast. Keep taking the gabapentin, and we will hopefully get the MRI soon.    If you have any questions please feel free to the call the clinic at anytime at [phone]. It was a pleasure seeing you!  Best, Dr. Sanjuana Mae

## 2022-08-07 DIAGNOSIS — N644 Mastodynia: Secondary | ICD-10-CM | POA: Insufficient documentation

## 2022-08-07 NOTE — Assessment & Plan Note (Addendum)
Patient states that night previous to encounter she felt some pain in her left breast.  The pain is located on the upper left quadrant.  She denies any trauma to the area.  On physical exam breasts are symmetric, no erythema, no nipple discharge, and no lumps palpated.  She denies any recent fevers, shortness of breath, chills.  She has no family history of breast cancer.  Plan: - Will order mammogram for further evaluation, in the meanwhile recommended ice for the area

## 2022-08-07 NOTE — Progress Notes (Signed)
CC: Cervical neck pain follow-up  HPI:  [name] is a 57 y.o. female living with a history stated below and presents today for cervical neck pain follow-up. Please see problem based assessment and plan for additional details.  Past Medical History:  Diagnosis Date   Allergy    seasonal   GERD (gastroesophageal reflux disease)    HIV (human immunodeficiency virus infection) ([place])    Hyperlipidemia    Kidney stone    Substance abuse ([place])     Current Outpatient Medications on File Prior to Visit  Medication Sig Dispense Refill   albuterol (VENTOLIN HFA) 108 (90 Base) MCG/ACT inhaler Inhale 1-2 puffs into the lungs every 6 (six) hours as needed for wheezing or shortness of breath. 18 g 0   cyclobenzaprine (FLEXERIL) 10 MG tablet Take 1 tablet (10 mg total) by mouth 2 (two) times daily as needed for muscle spasms. 10 tablet 0   elvitegravir-cobicistat-emtricitabine-tenofovir (GENVOYA) 150-150-200-10 MG TABS tablet Take 1 tablet by mouth daily with breakfast. 30 tablet 6   gabapentin (NEURONTIN) 100 MG capsule Take 1 capsule (100 mg total) by mouth 3 (three) times daily. 90 capsule 1   lisinopril-hydrochlorothiazide (ZESTORETIC) 20-12.5 MG tablet Take 1 tablet by mouth daily. 30 tablet 11   rosuvastatin (CRESTOR) 5 MG tablet TAKE 1 TABLET (5 MG TOTAL) BY MOUTH DAILY. 90 tablet 1   varenicline (CHANTIX) 1 MG tablet TAKE 1 TABLET BY MOUTH TWICE A DAY 180 tablet 1   venlafaxine XR (EFFEXOR-XR) 37.5 MG 24 hr capsule TAKE 1 CAPSULE BY MOUTH DAILY WITH BREAKFAST. 90 capsule 1   No current facility-administered medications on file prior to visit.    Family History  Problem Relation Age of Onset   Diabetes Brother    Hypertension Brother    Arthritis Mother    Healthy Father    Stomach cancer Neg Hx    Rectal cancer Neg Hx    Colon cancer Neg Hx    Colon polyps Neg Hx     Social History   Socioeconomic History   Marital status: Unknown    Spouse name: Not on file    Number of children: Not on file   Years of education: Not on file   Highest education level: Not on file  Occupational History   Occupation: PCA  Tobacco Use   Smoking status: Every Day    Packs/day: 0.10    Years: 30.00    Total pack years: 3.00    Types: Cigarettes   Smokeless tobacco: Never   Tobacco comments:    "cutting back," patient is ready to quit, takes about a week to go through a pack  Vaping Use   Vaping Use: Former  Substance and Sexual Activity   Alcohol use: Yes    Comment: occasional   Drug use: No    Comment: greater than 35 years ago   Sexual activity: Not Currently    Partners: Male    Comment: declined condoms  Other Topics Concern   Not on file  Social History Narrative   ** Merged History Encounter **       Social Determinants of Health   Financial Resource Strain: Low Risk  (07/29/2017)   Overall Financial Resource Strain (CARDIA)    Difficulty of Paying Living Expenses: Not hard at all  Food Insecurity: No Food Insecurity (07/29/2017)   Hunger Vital Sign    Worried About Running Out of Food in the Last Year: Never true  Ran Out of Food in the Last Year: Never true  Transportation Needs: No Transportation Needs (07/29/2017)   PRAPARE - Hydrologist (Medical): No    Lack of Transportation (Non-Medical): No  Physical Activity: Not on file  Stress: Not on file  Social Connections: Not on file  Intimate Partner Violence: Not on file    Review of Systems: ROS negative except for what is noted on the assessment and plan.  Vitals:   08/06/22 1521  BP: 129/80  Pulse: 81  Temp: 98 F (36.7 C)  TempSrc: Oral  SpO2: 98%  Weight: 190 lb 9.6 oz (86.5 kg)  Height: '5\' 4"'$  (1.626 m)    Physical Exam: Constitutional: well-appearing female sitting in chair, in no acute distress HENT: normocephalic atraumatic, mucous membranes moist Eyes: conjunctiva non-erythematous Neck: supple Cardiovascular: regular rate and  rhythm, no m/r/g Pulmonary/Chest: normal work of breathing on room air, lungs clear to auscultation bilaterally Abdominal: soft, non-tender, non-distended MSK: normal bulk and tone, range of motion of both upper extremities limited by pain.  Tenderness to palpation on cervical area bilaterally. Neurological: alert & oriented x 3, 5/5 strength in bilateral upper and lower extremities, sensation normal in both extremities at this moment. GU: Breast exam performed with chaperone, symmetrical nonerythematous no lumps palpated  Assessment & Plan:   Cervical herniated disc Patient continues to have ongoing cervical neck pain with radicular symptoms bilaterally.  She still has not had the MRI completed, visit was pending insurance authorization.  MRI has now been approved, and is scheduled for March 1 at 2:30 PM.  Will follow-up the results, and can place referral to neurosurgery or orthopedic surgery if imaging is consistent with cervical compression.  She has been using NSAIDs, and was also given gabapentin at last visit which she states has been helping her greatly.  She has gone to physical therapy however that was mostly for her back.  Will also send referral to physical therapy with plans for cervical neck therapy.  Will continue with the gabapentin and NSAIDs.  Breast pain, left Patient states that night previous to encounter she felt some pain in her left breast.  The pain is located on the upper left quadrant.  She denies any trauma to the area.  On physical exam breasts are symmetric, no erythema, no nipple discharge, and no lumps palpated.  She denies any recent fevers, shortness of breath, chills.  She has no family history of breast cancer.  Plan: - Will order mammogram for further evaluation, in the meanwhile recommended ice for the area  Patient discussed with Dr. Margot Ables, M.D. [facility], PGY-1 Phone: [phone] Date 08/07/2022 Time 11:30 AM

## 2022-08-07 NOTE — Assessment & Plan Note (Signed)
Patient continues to have ongoing cervical neck pain with radicular symptoms bilaterally.  She still has not had the MRI completed, visit was pending insurance authorization.  MRI has now been approved, and is scheduled for March 1 at 2:30 PM.  Will follow-up the results, and can place referral to neurosurgery or orthopedic surgery if imaging is consistent with cervical compression.  She has been using NSAIDs, and was also given gabapentin at last visit which she states has been helping her greatly.  She has gone to physical therapy however that was mostly for her back.  Will also send referral to physical therapy with plans for cervical neck therapy.  Will continue with the gabapentin and NSAIDs.

## 2022-08-08 NOTE — Addendum Note (Signed)
Addended by: Jodean Lima on: 08/08/2022 03:41 PM   Modules accepted: Level of Service

## 2022-08-08 NOTE — Progress Notes (Signed)
Internal Medicine Clinic Attending  Case discussed with Dr. Sanjuana Mae  At the time of the visit.  We reviewed the resident's history and exam and pertinent patient test results.  I agree with the assessment, diagnosis, and plan of care documented in the resident's note.

## 2022-08-10 ENCOUNTER — Ambulatory Visit (HOSPITAL_COMMUNITY)
Admission: RE | Admit: 2022-08-10 | Discharge: 2022-08-10 | Disposition: A | Discharge status: Home / Self Care | Payer: Medicaid Other | Source: Ambulatory Visit | Attending: Internal Medicine | Admitting: Internal Medicine

## 2022-08-10 DIAGNOSIS — M5020 Other cervical disc displacement, unspecified cervical region: Secondary | ICD-10-CM | POA: Diagnosis present

## 2022-08-14 ENCOUNTER — Other Ambulatory Visit: Payer: Self-pay | Admitting: Student

## 2022-08-14 DIAGNOSIS — N644 Mastodynia: Secondary | ICD-10-CM

## 2022-08-20 ENCOUNTER — Ambulatory Visit
Admission: EM | Admit: 2022-08-20 | Discharge: 2022-08-20 | Disposition: A | Discharge status: Home / Self Care | Payer: Medicaid Other | Attending: Internal Medicine | Admitting: Internal Medicine

## 2022-08-20 DIAGNOSIS — H1013 Acute atopic conjunctivitis, bilateral: Secondary | ICD-10-CM | POA: Diagnosis not present

## 2022-08-20 MED ORDER — OLOPATADINE HCL 0.1 % OP SOLN: 1.0000 [drp] | Freq: Two times a day (BID) | OPHTHALMIC | 0 refills | Status: DC

## 2022-08-20 MED ORDER — POLYMYXIN B-TRIMETHOPRIM 10000-0.1 UNIT/ML-% OP SOLN: 1.0000 [drp] | OPHTHALMIC | 0 refills | Status: DC

## 2022-08-20 NOTE — ED Triage Notes (Signed)
Pt c/o right conjunctivitis onset ~ yesterday states she woke up with sticky clear drainage.

## 2022-08-20 NOTE — ED Provider Notes (Signed)
[name]    [name]: [id number] Arrival date & time: 08/20/22  [id number]      History   Chief Complaint Chief Complaint  Patient presents with   Conjunctivitis    HPI Vanessa Browning Vanessa Browning is a 57 y.o. female.   Patient presents to urgent care for evaluation of bilateral eye irritation that is worse on the right side starting yesterday.  Reports eye itching bilaterally but worse to the right side.  Woke up yesterday morning with crusty drainage to the right eye.  Drainage is clear but thick.  Improves throughout the day after using warm washcloths to do warm compresses.  No obvious eye redness to either eye.  No recent known sick contacts with similar symptoms.  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  She does not have any viral upper respiratory tract infection symptoms, fever/chills, blurry vision, decreased visual acuity, dizziness, headache, ear pain, or tinnitus. She does not wear contacts or glasses for vision correction. She has not attempted use of any over-the-counter medications before coming to urgent care.  She is immunocompromise with HIV.  No rash.  Denies pain to the eyes.   Conjunctivitis    Past Medical History:  Diagnosis Date   Allergy    seasonal   GERD (gastroesophageal reflux disease)    HIV (human immunodeficiency virus infection) ([place])    Hyperlipidemia    Kidney stone    Substance abuse [facility])     Patient Active Problem List   Diagnosis Date Noted   Breast pain, left 08/07/2022   History of hyperglycemia 12/18/2021   Chronic low back pain without sciatica 07/24/2021   Generalized anxiety disorder 02/12/2020   Essential hypertension 02/12/2020   Healthcare maintenance 09/04/2018   LGSIL on Pap smear of cervix 11/06/2017   Obesity, Class I, BMI 30-34.9 01/20/2016   Hyperlipidemia 10/01/2012   Tobacco abuse 05/31/2011   Cervical herniated disc 12/09/2008   Mononeuropathy 08/19/2008   Human immunodeficiency virus (HIV) disease ([place]) 01/13/2007    Allergic rhinitis 01/13/2007    Past Surgical History:  Procedure Laterality Date   APPENDECTOMY     APPENDECTOMY     CHOLECYSTECTOMY     INDUCED ABORTION     TONSILLECTOMY      OB History     Gravida  6   Para  0   Term  0   Preterm  0   AB  1   Living         SAB  0   IAB  0   Ectopic  0   Multiple      Live Births               Home Medications    Prior to Admission medications   Medication Sig Start Date End Date Taking? Authorizing Provider  olopatadine (PATANOL) 0.1 % ophthalmic solution Place 1 drop into both eyes 2 (two) times daily. 08/20/22  Yes Talbot Grumbling, FNP  trimethoprim-polymyxin b (POLYTRIM) ophthalmic solution Place 1 drop into the right eye every 4 (four) hours. Use as directed for 5-7 days. 08/20/22  Yes Talbot Grumbling, FNP  albuterol (VENTOLIN HFA) 108 (90 Base) MCG/ACT inhaler Inhale 1-2 puffs into the lungs every 6 (six) hours as needed for wheezing or shortness of breath. 02/10/21   Jaynee Eagles, PA-C  cyclobenzaprine (FLEXERIL) 10 MG tablet Take 1 tablet (10 mg total) by mouth 2 (two) times daily as needed for muscle spasms. 05/28/22   Melynda Ripple, NP  elvitegravir-cobicistat-emtricitabine-tenofovir (GENVOYA) 150-150-200-10 MG TABS tablet Take 1 tablet by mouth daily with breakfast. 04/09/22   Gold[place] Circle, FNP  gabapentin (NEURONTIN) 100 MG capsule Take 1 capsule (100 mg total) by mouth 3 (three) times daily. 06/13/22 06/13/23  Riesa Pope, MD  lisinopril-hydrochlorothiazide (ZESTORETIC) 20-12.5 MG tablet Take 1 tablet by mouth daily. 12/18/21 12/18/22  Katsadouros, Vasilios, MD  rosuvastatin (CRESTOR) 5 MG tablet TAKE 1 TABLET (5 MG TOTAL) BY MOUTH DAILY. 09/07/21 09/07/22  Mitzi Hansen, MD  varenicline (CHANTIX) 1 MG tablet TAKE 1 TABLET BY MOUTH TWICE A DAY 03/26/22   Johny Blamer, DO  venlafaxine XR (EFFEXOR-XR) 37.5 MG 24 hr capsule TAKE 1 CAPSULE BY MOUTH DAILY WITH BREAKFAST. 07/09/22   Johny Blamer, DO    Family History Family History  Problem Relation Age of Onset   Diabetes Brother    Hypertension Brother    Arthritis Mother    Healthy Father    Stomach cancer Neg Hx    Rectal cancer Neg Hx    Colon cancer Neg Hx    Colon polyps Neg Hx     Social History Social History   Tobacco Use   Smoking status: Every Day    Packs/day: 0.10    Years: 30.00    Total pack years: 3.00    Types: Cigarettes   Smokeless tobacco: Never   Tobacco comments:    "cutting back," patient is ready to quit, takes about a week to go through a pack  Vaping Use   Vaping Use: Former  Substance Use Topics   Alcohol use: Yes    Comment: occasional   Drug use: No    Comment: greater than 35 years ago     Allergies   Dexilant [dexlansoprazole]   Review of Systems Review of Systems Per HPI  Physical Exam Triage Vital Signs ED Triage Vitals [08/20/22 0901]  Enc Vitals Group     BP 138/81     Pulse Rate 85     Resp 16     Temp 98.2 F (36.8 C)     Temp Source Oral     SpO2 98 %     Weight      Height      Head Circumference      Peak Flow      Pain Score 0     Pain Loc      Pain Edu?      Excl. in [place]?    No data found.  Updated Vital Signs BP 138/81 (BP Location: Left Arm)   Pulse 85   Temp 98.2 F (36.8 C) (Oral)   Resp 16   SpO2 98%   Visual Acuity Right Eye Distance: 20/50 Left Eye Distance: 20/50 Bilateral Distance: 20/40  Right Eye Near:   Left Eye Near:    Bilateral Near:     Physical Exam Vitals and nursing note reviewed.  Constitutional:      Appearance: She is not ill-appearing or toxic-appearing.  HENT:     Head: Normocephalic and atraumatic.     Right Ear: Hearing and external ear normal.     Left Ear: Hearing and external ear normal.     Nose: Nose normal.     Mouth/Throat:     Lips: Pink.  Eyes:     General: Lids are normal. Vision grossly intact. Gaze aligned appropriately. No allergic shiner or visual field deficit.       Right  eye: No foreign body or discharge.  Left eye: No foreign body or discharge.     Extraocular Movements: Extraocular movements intact.     Conjunctiva/sclera: Conjunctivae normal.     Right eye: Right conjunctiva is not injected. No chemosis, exudate or hemorrhage.    Left eye: Left conjunctiva is not injected. No chemosis, exudate or hemorrhage.    Comments: EOMs intact without pain or dizziness elicited.  Cardiovascular:     Rate and Rhythm: Normal rate and regular rhythm.     Heart sounds: Normal heart sounds, S1 normal and S2 normal.  Pulmonary:     Effort: Pulmonary effort is normal. No respiratory distress.     Breath sounds: Normal breath sounds and air entry.  Musculoskeletal:     Cervical back: Neck supple.  Skin:    General: Skin is warm and dry.     Capillary Refill: Capillary refill takes less than 2 seconds.     Findings: No rash.  Neurological:     General: No focal deficit present.     Mental Status: She is alert and oriented to person, place, and time. Mental status is at baseline.     Cranial Nerves: No dysarthria or facial asymmetry.  Psychiatric:        Mood and Affect: Mood normal.        Speech: Speech normal.        Behavior: Behavior normal.        Thought Content: Thought content normal.        Judgment: Judgment normal.      UC Treatments / Results  Labs (all labs ordered are listed, but only abnormal results are displayed) Labs Reviewed - No data to display  EKG   Radiology No results found.  Procedures Procedures (including critical care time)  Medications Ordered in UC Medications - No data to display  Initial Impression / Assessment and Plan / UC Course  I have reviewed the triage vital signs and the nursing notes.  Pertinent labs & imaging results that were available during my care of the patient were reviewed by me and considered in my medical decision making (see chart for details).   1.  Allergic conjunctivitis of both  eyes Presentation is consistent with allergic conjunctivitis, however there is concern for bacterial etiology to the right eye as symptoms are worse in this eye.  Eye exam is stable at this time.  Patient to use olopatadine eyedrops as directed.  May pick up Polytrim eyedrops from pharmacy and use these to the right eye should symptoms fail to improve in the next 1 to 2 days with olopatadine eyedrops or if she develops significant worsening eye redness, drainage, or discomfort to the right eye.  Advised to wash hands frequently and continue use of warm compresses.  She is agreeable with this plan.   Discussed physical exam and available lab work findings in clinic with patient.  Counseled patient regarding appropriate use of medications and potential side effects for all medications recommended or prescribed today. Discussed red flag signs and symptoms of worsening condition,when to call the PCP office, return to urgent care, and when to seek higher level of care in the emergency department. Patient verbalizes understanding and agreement with plan. All questions answered. Patient discharged in stable condition.    Final Clinical Impressions(s) / UC Diagnoses   Final diagnoses:  Allergic conjunctivitis of both eyes     Discharge Instructions      You were seen urgent care for your eye irritation.  Use olopatadine  eyedrops to both eyes starting today as directed.  If your symptoms to the right eye do not improve with olopatadine eyedrops, you may start using the Polytrim eyedrops sent to pharmacy to cover you for bacterial pinkeye.   If you develop any new or worsening symptoms or do not improve in the next 2 to 3 days, please return.  If your symptoms are severe, please go to the emergency room.  Follow-up with your primary care provider for further evaluation and management of your symptoms as well as ongoing wellness visits.  I hope you feel better!     ED Prescriptions     Medication  Sig Dispense Auth. Provider   olopatadine (PATANOL) 0.1 % ophthalmic solution Place 1 drop into both eyes 2 (two) times daily. 5 mL Talbot Grumbling, FNP   trimethoprim-polymyxin b (POLYTRIM) ophthalmic solution Place 1 drop into the right eye every 4 (four) hours. Use as directed for 5-7 days. 10 mL Talbot Grumbling, FNP      PDMP not reviewed this encounter.   Joella Prince [place], [place] 08/20/22 [phone]

## 2022-08-20 NOTE — Discharge Instructions (Signed)
You were seen urgent care for your eye irritation.  Use olopatadine eyedrops to both eyes starting today as directed.  If your symptoms to the right eye do not improve with olopatadine eyedrops, you may start using the Polytrim eyedrops sent to pharmacy to cover you for bacterial pinkeye.   If you develop any new or worsening symptoms or do not improve in the next 2 to 3 days, please return.  If your symptoms are severe, please go to the emergency room.  Follow-up with your primary care provider for further evaluation and management of your symptoms as well as ongoing wellness visits.  I hope you feel better!

## 2022-08-23 NOTE — Therapy (Signed)
OUTPATIENT PHYSICAL THERAPY Cervical/ THORACOLUMBAR EVALUATION   Patient Name: Vanessa Browning MRN: [id number] DOB:12-26-1965, 57 y.o., female Today's Date: 08/28/2022  END OF SESSION:  PT End of Session - 08/28/22 0850     Visit Number 1    Number of Visits 13    Date for PT Re-Evaluation 10/11/22    Authorization Type [place] MEDICAID [employer]    PT Start Time 0808    PT Stop Time 0900    PT Time Calculation (min) 52 min    Activity Tolerance Patient tolerated treatment well    Behavior During Therapy WFL for tasks assessed/performed             Past Medical History:  Diagnosis Date   Allergy    seasonal   GERD (gastroesophageal reflux disease)    HIV (human immunodeficiency virus infection) ([place])    Hyperlipidemia    Kidney stone    Substance abuse ([place])    Past Surgical History:  Procedure Laterality Date   APPENDECTOMY     APPENDECTOMY     CHOLECYSTECTOMY     INDUCED ABORTION     TONSILLECTOMY     Patient Active Problem List   Diagnosis Date Noted   Breast pain, left 08/07/2022   History of hyperglycemia 12/18/2021   Chronic low back pain without sciatica 07/24/2021   Generalized anxiety disorder 02/12/2020   Essential hypertension 02/12/2020   Healthcare maintenance 09/04/2018   LGSIL on Pap smear of cervix 11/06/2017   Obesity, Class I, BMI 30-34.9 01/20/2016   Hyperlipidemia 10/01/2012   Tobacco abuse 05/31/2011   Cervical herniated disc 12/09/2008   Mononeuropathy 08/19/2008   Human immunodeficiency virus (HIV) disease ([place]) 01/13/2007   Allergic rhinitis 01/13/2007    PCP: Johny Blamer, DO   REFERRING PROVIDER: Velna Ochs, MD neck, Alita Chyle MD low back  REFERRING DIAG: M50.20 (ICD-10-CM) - Cervical herniated disc     M54.50,G89.29 (ICD-10-CM) - Chronic bilateral low back pain without sciatica   Rationale for Evaluation and Treatment: Rehabilitation  THERAPY DIAG:  Cervicalgia - Plan: PT plan of care  cert/re-cert  Muscle weakness (generalized) - Plan: PT plan of care cert/re-cert  Abnormal posture - Plan: PT plan of care cert/re-cert  Chronic bilateral low back pain without sciatica - Plan: PT plan of care cert/re-cert  ONSET DATE: I have had neck pain 7 years  SUBJECTIVE:                                                                                                                                                                                           SUBJECTIVE STATEMENT: I have had back  and neck pain for many years but it has gotten worse in the last 6 months when my numbness  started in the last two fingers on RT side,  and then it goes into my now both hands now. I also have pain in my low back with tingling in RT side LE painful and numbness going to mid shin. It is hard for me to lie on either side. I toss and turn every night. I wake up about every 3-4 hours at night.  I can sit on a computer for 15 min but have to get up.  I can stand washing dishes about 5-10 min.  I do not do regular exercise. I try to chase my dog. I try to do the exercise for my back I did ,but my neck Is the main problem right now.  PERTINENT HISTORY:  GERD, HIV, Hyperlipidemia, substance abuse hx, Kidney stones, Appy, cholecsystectomy, tonsilectomy  PAIN:  Are you having pain? Yes: NPRS scale: at rest my neck is 5/10 and at worst is 10/10 and my back at rest is 4/10 and at worst my back is 8/10 Pain location: neck the most and also RT side back radiating Pain description: irritating aching , numbing into RT hand and in back it runs down throbbing burning pain Aggravating factors: sleeping, doing household chores for more than 5 to 10 min, sometimes just sitting and watching TV I try to carry groceries but it hurts Relieving factors: medication makes it better  PRECAUTIONS: HIV  WEIGHT BEARING RESTRICTIONS: No  FALLS:  Has patient fallen in last 6 months? No  LIVING ENVIRONMENT: Lives with: lives  alone Lives in: House/apartment Stairs: Yes: External: 3 steps; none Has following equipment at home: None  OCCUPATION: Disability  PLOF: Independent  PATIENT GOALS: Figure out the pain, reduce pain in neck mostly and exercise my body  NEXT MD VISIT: TBD  being sent to a neurologist  OBJECTIVE:   DIAGNOSTIC FINDINGS:  COMPARISON:  Prior MRI from 08/22/2008.   FINDINGS: Alignment: Straightening of the normal cervical lordosis. No listhesis.   Vertebrae: Vertebral body height maintained without acute or chronic fracture. Bone marrow signal intensity within normal limits. No discrete or worrisome osseous lesions or abnormal marrow edema.   Cord: Normal signal and morphology.   Posterior Fossa, vertebral arteries, paraspinal tissues: Partially empty sella noted. Visualized brain and posterior fossa otherwise unremarkable. Craniocervical junction normal. Paraspinous soft tissues within normal limits. Normal flow voids seen within the vertebral arteries bilaterally.   Disc levels:   C2-C3: Unremarkable.   C3-C4: Mild disc bulge with uncovertebral spurring. No significant spinal stenosis. Foramina remain patent.   C4-C5: Mild disc bulge with right-sided uncovertebral spurring. Superimposed tiny central disc protrusion minimally indents the ventral thecal sac (series 5, image 25). No significant spinal stenosis. Borderline mild right C5 foraminal narrowing. Left neural foramen remains patent.   C5-C6: Shallow broad-based right paracentral disc protrusion with annular fissure indents the ventral thecal sac (series 6, image 18). Mild cord flattening without cord signal changes. No significant spinal stenosis. Foramina remain patent.   C6-C7: Broad-based left eccentric disc osteophyte complex mildly flattens the ventral thecal sac. Superimposed tiny left paracentral soft disc protrusion with inferior migration (series 5, image 38). No significant spinal stenosis or cord  impingement. Foramina remain adequately patent.   C7-T1: Normal interspace. Mild right-sided facet hypertrophy. No canal or foraminal stenosis.   IMPRESSION: 1. Shallow right paracentral disc protrusion at C5-6 with resultant mild cord flattening, but no  cord signal changes or significant stenosis. 2. Left eccentric disc osteophyte complex at C6-7 with superimposed tiny left paracentral disc protrusion. The ventral left C7 nerve root could be affected. 3. Mild disc bulge with right-sided uncovertebral spurring at C4-5 with resultant borderline mild right C5 foraminal stenosis. 4. Partially empty sella. While this finding is often incidental in nature and of no clinical significance, this can also be seen in the setting of idiopathic intracranial hypertension.  PATIENT SURVEYS:  NDI 20/50 40%  SCREENING FOR RED FLAGS: Bowel or bladder incontinence: No  COGNITION: Overall cognitive status: Within functional limits for tasks assessed     SENSATION: Pt reports numbness in RT hand c -6/7 distribution but then complains of numbness in bil whole hand and down RT LE  MUSCLE LENGTH: Hamstrings: Right WNL deg; Left WNL deg Marcello Moores test: Right  WNL deg; Left WNL deg  POSTURE:  rounded shoulders, forward head, increased lumbar lordosis, anterior pelvic tilt, and CT step off  PALPATION: No TTP in lumbar, but marked TTP in RT C-5,6,7 neck CERVICAL ROM:   Active ROM A/PROM (deg) eval  Flexion 45  Extension 39  Right lateral flexion 20  Left lateral flexion 24  Right rotation 50  Left rotation 62   (Blank rows = not tested)    UPPER EXTREMITY ROM:  WNL in BIl UE  Active ROM Right eval Left eval  Shoulder flexion    Shoulder extension    Shoulder abduction    Shoulder adduction    Shoulder extension    Shoulder internal rotation    Shoulder external rotation    Elbow flexion    Elbow extension    Wrist flexion    Wrist extension    Wrist ulnar deviation    Wrist  radial deviation    Wrist pronation    Wrist supination     (Blank rows = not tested)  UPPER EXTREMITY MMT:  MMT Right eval Left eval  Shoulder flexion 4p 4  Shoulder extension    Shoulder abduction 4p 4  Shoulder adduction    Shoulder extension    Shoulder internal rotation    Shoulder external rotation    Middle trapezius    Lower trapezius    Elbow flexion    Elbow extension    Wrist flexion    Wrist extension    Wrist ulnar deviation    Wrist radial deviation    Wrist pronation    Wrist supination    Grip strength 68lb 64lb   (Blank rows = not tested)  CERVICAL SPECIAL TESTS:  Neck flexor muscle endurance test: Positive, Spurling's test: Positive, and Distraction test: Negative  LUMBAR ROM:   AROM eval  Flexion Fingertips to bil shins  Extension 10 increase RT LE pain  Right lateral flexion  WNL  Left lateral flexion WNL  Right rotation WNL  Left rotation WNL   (Blank rows = not tested)  LOWER EXTREMITY ROM:    WNL  Active  Right eval Left eval  Hip flexion    Hip extension    Hip abduction    Hip adduction    Hip internal rotation    Hip external rotation    Knee flexion    Knee extension    Ankle dorsiflexion    Ankle plantarflexion    Ankle inversion    Ankle eversion     (Blank rows = not tested)  LOWER EXTREMITY MMT:   weak abdominals  grossly  4- 4+/5  MMT  Right eval Left eval  Hip flexion 4+ 4+  Hip extension    Hip abduction 4 4  Hip adduction    Hip internal rotation    Hip external rotation    Knee flexion    Knee extension    Ankle dorsiflexion    Ankle plantarflexion    Ankle inversion    Ankle eversion     (Blank rows = not tested)  LUMBAR SPECIAL TESTS:  Straight leg raise test: Negative and Slump test: Negative  FUNCTIONAL TESTS:  5 times sit to stand: 27.56 sec 6 minute walk test: TBD  GAIT: Distance walked: 150 feet Assistive device utilized: None Level of assistance: Complete Independence Comments:  WFL's  TODAY'S TREATMENT:                                                                                                                              DATE: EVAL and issue HEP    PATIENT EDUCATION:  Education details: POC, explanation of NDI, explanation of findings, issue HEP Person educated: Patient Education method: Explanation, Demonstration, Tactile cues, Verbal cues, and Handouts Education comprehension: verbalized understanding, returned demonstration, verbal cues required, tactile cues required, and needs further education  HOME EXERCISE PROGRAM: Access Code: [email or web address] URL: [email or web address] Date: 08/28/2022 Prepared by: Voncille Lo  Exercises - Supine Deep Neck Flexor Training  - 2-3 x daily - 7 x weekly - 10 reps - 3 hold - Seated Cervical Retraction Protraction AROM  - 2-3 x daily - 7 x weekly - 1 sets - 10 reps - Seated Gentle Upper Trapezius Stretch  - 1 x daily - 7 x weekly - 1 sets - 3 reps - 30 hold - Gentle Levator Scapulae Stretch  - 1 x daily - 7 x weekly - 3 sets - 3 reps - 30 hold  ASSESSMENT:  CLINICAL IMPRESSION: Patient is a 57 y.o. female previously seen for back pain  who was seen today for physical therapy evaluation and treatment for cervical radiculopathy and ongoing neck and spine issues. Pt symptoms compatible with cervical radiculopathy and will benefit from skilled PT to address issues with sleeping and being able to perform household duties at home with decreased pain in neck.   OBJECTIVE IMPAIRMENTS: decreased ROM, decreased strength, impaired sensation, impaired UE functional use, improper body mechanics, postural dysfunction, obesity, and pain.   ACTIVITY LIMITATIONS: carrying, lifting, bending, sitting, standing, sleeping, and reach over head  PARTICIPATION LIMITATIONS: cleaning, laundry, and driving  PERSONAL FACTORS: GERD, HIV, Hyperlipidemia, substance abuse hx, Kidney stones, Appy, cholecsystectomy, tonsilectomy are  also affecting patient's functional outcome.   REHAB POTENTIAL: Good  CLINICAL DECISION MAKING: Evolving/moderate complexity  EVALUATION COMPLEXITY: Moderate   GOALS: Goals reviewed with patient? Yes  SHORT TERM GOALS: Target date: 08-18-22  Pt will be independent with Initial HEP Baseline: Goal status: INITIAL  2.  Pt will be able to reduce pain with movement  and worst pain from 10/10 to at least  6/10 Baseline: eval 10/10 at worst Goal status: INITIAL  3.  Pt will be able to demonstrate supine neck flexion test for at least 35 sec  to show improved strength Baseline: eval 18 sec  Goal status: INITIAL  4.Improved cervical rotation to allow checking blind spots while driving with minimal discomfort Baseline: See AROM flow chart Goal status: INITIAL    LONG TERM GOALS: Target date: 10-11-22  Pt  will be independent with advanced HEP Baseline:  Goal status: INITIAL  2.  Pt NDI will improve at least 10 point from 20/50 to 10/50 Baseline: 20/50 40% Goal status: INITIAL  3.  Pt will be able to utilize sleep positions to maximize sleep for 4 or more hours of restorative consecutive sleep Baseline:  Pt wakes every 1-2 hours a night and cannot sleep on sides due to pain Goal status: INITIAL  4.  Pt will be able to increase Cervical AROM to within 5 degrees of RT/LT Baseline: See flow chart AROM cervical Goal status: INITIAL  5.  Pt will be able to pick up 15 pounds/simulating groceries from the floor with minimal Neck pain Baseline: Pt avoids lifting anything do to pain Goal status: INITIAL  6.  Pt will be able to lift arms overhead to do household chores without out exacerbating neck pain Baseline:  Goal status: INITIAL  PLAN:  PT FREQUENCY: 1-2x/week  PT DURATION: 6 weeks  PLANNED INTERVENTIONS: Therapeutic exercises, Therapeutic activity, Neuromuscular re-education, Balance training, Gait training, Patient/Family education, Self Care, Joint mobilization, Dry  Needling, Electrical stimulation, Spinal mobilization, Cryotherapy, Moist heat, Taping, Traction, Manual therapy, and Re-evaluation.  PLAN FOR NEXT SESSION: go over HEP and progress as able , also TPDN as needed  Voncille Lo, PT, [place] Certified Exercise Expert for the Aging Adult  08/28/22 9:32 AM Phone: [phone] Fax: [phone]   Check all possible CPT codes: 97164 - PT Re-evaluation, 97110- Therapeutic Exercise, [phone]- Neuro Re-education, [phone] - Gait Training, [phone] - Manual Therapy, 97530 - Therapeutic Activities, 97535 - Self Care, [phone] - Mechanical traction, 97014 - Electrical stimulation (unattended), [id number] - Electrical stimulation (Manual), [id number] - Ultrasound, and [id number] - Physical performance training    Check all conditions that are expected to impact treatment: Morbid obesity and Musculoskeletal disorders   If treatment provided at initial evaluation, no treatment charged due to lack of authorization.

## 2022-08-28 ENCOUNTER — Other Ambulatory Visit: Payer: Self-pay

## 2022-08-28 ENCOUNTER — Ambulatory Visit: Payer: Medicaid Other | Attending: Internal Medicine | Admitting: Physical Therapy

## 2022-08-28 DIAGNOSIS — M542 Cervicalgia: Secondary | ICD-10-CM | POA: Diagnosis present

## 2022-08-28 DIAGNOSIS — M5020 Other cervical disc displacement, unspecified cervical region: Secondary | ICD-10-CM | POA: Diagnosis not present

## 2022-08-28 DIAGNOSIS — M6281 Muscle weakness (generalized): Secondary | ICD-10-CM | POA: Diagnosis present

## 2022-08-28 DIAGNOSIS — M545 Low back pain, unspecified: Secondary | ICD-10-CM | POA: Insufficient documentation

## 2022-08-28 DIAGNOSIS — G8929 Other chronic pain: Secondary | ICD-10-CM | POA: Diagnosis present

## 2022-08-28 DIAGNOSIS — R293 Abnormal posture: Secondary | ICD-10-CM | POA: Insufficient documentation

## 2022-09-06 ENCOUNTER — Ambulatory Visit: Admission: RE | Admit: 2022-09-06 | Payer: Medicaid Other | Source: Ambulatory Visit

## 2022-09-06 ENCOUNTER — Ambulatory Visit
Admission: RE | Admit: 2022-09-06 | Discharge: 2022-09-06 | Disposition: A | Discharge status: Home / Self Care | Payer: Medicaid Other | Source: Ambulatory Visit | Attending: Internal Medicine | Admitting: Internal Medicine

## 2022-09-06 DIAGNOSIS — N644 Mastodynia: Secondary | ICD-10-CM

## 2022-09-11 NOTE — Therapy (Signed)
OUTPATIENT PHYSICAL THERAPY TREATMENT NOTE   Patient Name: Vanessa Browning MRN: [id number] DOB:Nov 28, 1965, 57 y.o., female Today's Date: 09/12/2022  PCP: Johny Blamer, DO    REFERRING PROVIDER: Velna Ochs, MD neck, Alita Chyle MD low back  END OF SESSION:   PT End of Session - 09/12/22 1631     Visit Number 2    Number of Visits 13    Date for PT Re-Evaluation 10/11/22    Authorization Type [place] MEDICAID [employer]    Authorization Time Period no auth    PT Start Time [id number]    PT Stop Time 1711    PT Time Calculation (min) 40 min    Activity Tolerance Patient tolerated treatment well;No increased pain    Behavior During Therapy WFL for tasks assessed/performed             Past Medical History:  Diagnosis Date   Allergy    seasonal   GERD (gastroesophageal reflux disease)    HIV (human immunodeficiency virus infection)    Hyperlipidemia    Kidney stone    Substance abuse    Past Surgical History:  Procedure Laterality Date   APPENDECTOMY     APPENDECTOMY     CHOLECYSTECTOMY     INDUCED ABORTION     TONSILLECTOMY     Patient Active Problem List   Diagnosis Date Noted   Breast pain, left 08/07/2022   History of hyperglycemia 12/18/2021   Chronic low back pain without sciatica 07/24/2021   Generalized anxiety disorder 02/12/2020   Essential hypertension 02/12/2020   Healthcare maintenance 09/04/2018   LGSIL on Pap smear of cervix 11/06/2017   Obesity, Class I, BMI 30-34.9 01/20/2016   Hyperlipidemia 10/01/2012   Tobacco abuse 05/31/2011   Cervical herniated disc 12/09/2008   Mononeuropathy 08/19/2008   Human immunodeficiency virus (HIV) disease 01/13/2007   Allergic rhinitis 01/13/2007    REFERRING DIAG:  M50.20 (ICD-10-CM) - Cervical herniated disc                                   M54.50,G89.29 (ICD-10-CM) - Chronic bilateral low back pain without sciatica   THERAPY DIAG:  Cervicalgia  Muscle weakness  (generalized)  Abnormal posture  Chronic bilateral low back pain without sciatica  Rationale for Evaluation and Treatment Rehabilitation  PERTINENT HISTORY: GERD, HIV, Hyperlipidemia, substance abuse hx, Kidney stones, Appy, cholecsystectomy, tonsilectomy   PRECAUTIONS: HIV  SUBJECTIVE:  Per eval - I have had back and neck pain for many years but it has gotten worse in the last 6 months when my numbness started in the last two fingers on RT side, and then it goes into my now both hands now. I also have pain in my low back with tingling in RT side LE painful and numbness going to mid shin. It is hard for me to lie on either side. I toss and turn every night. I wake up about every 3-4 hours at night. I can sit on a computer for 15 min but have to get up. I can stand washing dishes about 5-10 min. I do not do regular exercise. I try to chase my dog. I try to do the exercise for my back I did ,but my neck Is the main problem right now.   SUBJECTIVE STATEMENT:   Pt states she felt okay after initial evaluation, states she has been doing her HEP. States exercises give her transient relief "for about a second", doesn't really change pain much. No other new updates  PAIN:  Are you having pain? 8/10 neck , back doing okay   Per eval - Yes: NPRS scale: at rest my neck is 5/10 and at worst is 10/10 and my back at rest is 4/10 and at worst my back is 8/10 Pain location: neck the most and also RT side back radiating Pain description: irritating aching , numbing into RT hand and in back it runs down throbbing burning pain Aggravating factors: sleeping, doing household chores for more than 5 to 10 min, sometimes just sitting and watching TV I try to carry groceries but it hurts Relieving factors: medication makes it  better   OBJECTIVE: (objective measures completed at initial evaluation unless otherwise dated)   DIAGNOSTIC FINDINGS:  COMPARISON:  Prior MRI from 08/22/2008.   FINDINGS: Alignment: Straightening of the normal cervical lordosis. No listhesis.   Vertebrae: Vertebral body height maintained without acute or chronic fracture. Bone marrow signal intensity within normal limits. No discrete or worrisome osseous lesions or abnormal marrow edema.   Cord: Normal signal and morphology.   Posterior Fossa, vertebral arteries, paraspinal tissues: Partially empty sella noted. Visualized brain and posterior fossa otherwise unremarkable. Craniocervical junction normal. Paraspinous soft tissues within normal limits. Normal flow voids seen within the vertebral arteries bilaterally.   Disc levels:   C2-C3: Unremarkable.   C3-C4: Mild disc bulge with uncovertebral spurring. No significant spinal stenosis. Foramina remain patent.   C4-C5: Mild disc bulge with right-sided uncovertebral spurring. Superimposed tiny central disc protrusion minimally indents the ventral thecal sac (series 5, image 25). No significant spinal stenosis. Borderline mild right C5 foraminal narrowing. Left neural foramen remains patent.   C5-C6: Shallow broad-based right paracentral disc protrusion with annular fissure indents the ventral thecal sac (series 6, image 18). Mild cord flattening without cord signal changes. No significant spinal stenosis. Foramina remain patent.   C6-C7: Broad-based left eccentric disc osteophyte complex mildly flattens the ventral thecal sac. Superimposed tiny left paracentral soft disc protrusion with inferior migration (series 5, image 38). No significant spinal stenosis or cord impingement. Foramina remain adequately patent.   C7-T1: Normal interspace. Mild right-sided facet hypertrophy. No canal or foraminal stenosis.   IMPRESSION: 1. Shallow right paracentral disc protrusion  at C5-6 with resultant mild cord flattening, but no cord signal changes or significant stenosis. 2. Left eccentric disc osteophyte complex at C6-7 with superimposed tiny left paracentral disc protrusion. The ventral left C7 nerve  root could be affected. 3. Mild disc bulge with right-sided uncovertebral spurring at C4-5 with resultant borderline mild right C5 foraminal stenosis. 4. Partially empty sella. While this finding is often incidental in nature and of no clinical significance, this can also be seen in the setting of idiopathic intracranial hypertension.   PATIENT SURVEYS:  NDI 20/50 40%   SCREENING FOR RED FLAGS: Bowel or bladder incontinence: No   COGNITION: Overall cognitive status: Within functional limits for tasks assessed                          SENSATION: Pt reports numbness in RT hand c -6/7 distribution but then complains of numbness in bil whole hand and down RT LE   MUSCLE LENGTH: Hamstrings: Right WNL deg; Left WNL deg Marcello Moores test: Right  WNL deg; Left WNL deg   POSTURE:  rounded shoulders, forward head, increased lumbar lordosis, anterior pelvic tilt, and CT step off  PALPATION: No TTP in lumbar, but marked TTP in RT C-5,6,7 neck CERVICAL ROM:    Active ROM A/PROM (deg) eval  Flexion 45  Extension 39  Right lateral flexion 20  Left lateral flexion 24  Right rotation 50  Left rotation 62   (Blank rows = not tested)      UPPER EXTREMITY ROM:  WNL in BIl UE   Active ROM Right eval Left eval  Shoulder flexion      Shoulder extension      Shoulder abduction      Shoulder adduction      Shoulder extension      Shoulder internal rotation      Shoulder external rotation      Elbow flexion      Elbow extension      Wrist flexion      Wrist extension      Wrist ulnar deviation      Wrist radial deviation      Wrist pronation      Wrist supination       (Blank rows = not tested)   UPPER EXTREMITY MMT:   MMT Right eval Left eval   Shoulder flexion 4p 4  Shoulder extension      Shoulder abduction 4p 4  Shoulder adduction      Shoulder extension      Shoulder internal rotation      Shoulder external rotation      Middle trapezius      Lower trapezius      Elbow flexion      Elbow extension      Wrist flexion      Wrist extension      Wrist ulnar deviation      Wrist radial deviation      Wrist pronation      Wrist supination      Grip strength 68lb 64lb   (Blank rows = not tested)   CERVICAL SPECIAL TESTS:  Neck flexor muscle endurance test: Positive, Spurling's test: Positive, and Distraction test: Negative   LUMBAR ROM:    AROM eval  Flexion Fingertips to bil shins  Extension 10 increase RT LE pain  Right lateral flexion  WNL  Left lateral flexion WNL  Right rotation WNL  Left rotation WNL   (Blank rows = not tested)   LOWER EXTREMITY ROM:    WNL   Active  Right eval Left eval  Hip flexion      Hip extension  Hip abduction      Hip adduction      Hip internal rotation      Hip external rotation      Knee flexion      Knee extension      Ankle dorsiflexion      Ankle plantarflexion      Ankle inversion      Ankle eversion       (Blank rows = not tested)   LOWER EXTREMITY MMT:   weak abdominals  grossly  4- 4+/5   MMT Right eval Left eval  Hip flexion 4+ 4+  Hip extension      Hip abduction 4 4  Hip adduction      Hip internal rotation      Hip external rotation      Knee flexion      Knee extension      Ankle dorsiflexion      Ankle plantarflexion      Ankle inversion      Ankle eversion       (Blank rows = not tested)   LUMBAR SPECIAL TESTS:  Straight leg raise test: Negative and Slump test: Negative   FUNCTIONAL TESTS:  5 times sit to stand: 27.56 sec 6 minute walk test: TBD   GAIT: Distance walked: 150 feet Assistive device utilized: None Level of assistance: Complete Independence Comments: WFL's   TODAY'S TREATMENT:                                                                                                                               [facility] Adult PT Treatment:                                                DATE: 09/12/22 Therapeutic Exercise: Upper trap stretch 3x30sec BIL cues for breath control, appropriate form/ROM LS stretch 3x30sec BIL 3x30sec BIL cues for breath control, appropriate form/ROM  Cervical retraction, seated 2x10 cues for reduced compensations Cervical retraction supine 2x10 cues for form Scapular retraction, seated 2x10 cues for form  HEP update + education     PATIENT EDUCATION:  Education details: rationale for interventions, HEP, relevant anatomy/physiology  Person educated: Patient Education method: Explanation, Demonstration, Tactile cues, Verbal cues, and Handouts Education comprehension: verbalized understanding, returned demonstration, verbal cues required, tactile cues required, and needs further education   HOME EXERCISE PROGRAM: Access Code: [email or web address] URL: [email or web address] Date: 09/12/2022 Prepared by: Enis Slipper  Exercises - Supine Deep Neck Flexor Training  - 2-3 x daily - 7 x weekly - 10 reps - 3 hold - Seated Gentle Upper Trapezius Stretch  - 1 x daily - 7 x weekly - 1 sets - 3 reps - 30 hold - Gentle Levator Scapulae Stretch  - 1 x daily - 7 x weekly - 3 sets -  3 reps - 30 hold   ASSESSMENT:   CLINICAL IMPRESSION: 09/12/2022 Pt arrives w/ 8/10 pain mostly in neck, endorses good HEP adherence overall and no significant changes since initial eval. Upon HEP review pt demos significant compensations mostly with UT BIL and cervical extensors, tendency for more aggressive stretching techniques. Pt reports excellent relief with cues to mitigate this, emphasis on controlled ROM and breath control. Limited tolerance to seated chin tucks on this date but much improved in supine, HEP updated accordingly. Significant time spent w/ education on relevant anatomy/physiology and rationale for  interventions, appropriate HEP performance. Pt endorses 4/10 pain on NPS on departure, notes much improved tolerance to activity on this date. Recommend continuing along current POC in order to address relevant deficits and improve functional tolerance. Pt departs today's session in no acute distress, all voiced questions/concerns addressed appropriately from PT perspective.    Per eval - Patient is a 57 y.o. female previously seen for back pain  who was seen today for physical therapy evaluation and treatment for cervical radiculopathy and ongoing neck and spine issues. Pt symptoms compatible with cervical radiculopathy and will benefit from skilled PT to address issues with sleeping and being able to perform household duties at home with decreased pain in neck.    OBJECTIVE IMPAIRMENTS: decreased ROM, decreased strength, impaired sensation, impaired UE functional use, improper body mechanics, postural dysfunction, obesity, and pain.    ACTIVITY LIMITATIONS: carrying, lifting, bending, sitting, standing, sleeping, and reach over head   PARTICIPATION LIMITATIONS: cleaning, laundry, and driving   PERSONAL FACTORS: GERD, HIV, Hyperlipidemia, substance abuse hx, Kidney stones, Appy, cholecsystectomy, tonsilectomy are also affecting patient's functional outcome.    REHAB POTENTIAL: Good   CLINICAL DECISION MAKING: Evolving/moderate complexity   EVALUATION COMPLEXITY: Moderate     GOALS: Goals reviewed with patient? Yes   SHORT TERM GOALS: Target date: 08-18-22   Pt will be independent with Initial HEP Baseline: Goal status: INITIAL   2.  Pt will be able to reduce pain with movement  and worst pain from 10/10 to at least 6/10 Baseline: eval 10/10 at worst Goal status: INITIAL   3.  Pt will be able to demonstrate supine neck flexion test for at least 35 sec  to show improved strength Baseline: eval 18 sec  Goal status: INITIAL   4.Improved cervical rotation to allow checking blind spots  while driving with minimal discomfort Baseline: See AROM flow chart Goal status: INITIAL       LONG TERM GOALS: Target date: 10-11-22   Pt  will be independent with advanced HEP Baseline:  Goal status: INITIAL   2.  Pt NDI will improve at least 10 point from 20/50 to 10/50 Baseline: 20/50 40% Goal status: INITIAL   3.  Pt will be able to utilize sleep positions to maximize sleep for 4 or more hours of restorative consecutive sleep Baseline:  Pt wakes every 1-2 hours a night and cannot sleep on sides due to pain Goal status: INITIAL   4.  Pt will be able to increase Cervical AROM to within 5 degrees of RT/LT Baseline: See flow chart AROM cervical Goal status: INITIAL   5.  Pt will be able to pick up 15 pounds/simulating groceries from the floor with minimal Neck pain Baseline: Pt avoids lifting anything do to pain Goal status: INITIAL   6.  Pt will be able to lift arms overhead to do household chores without out exacerbating neck pain Baseline:  Goal status: INITIAL  PLAN:   PT FREQUENCY: 1-2x/week   PT DURATION: 6 weeks   PLANNED INTERVENTIONS: Therapeutic exercises, Therapeutic activity, Neuromuscular re-education, Balance training, Gait training, Patient/Family education, Self Care, Joint mobilization, Dry Needling, Electrical stimulation, Spinal mobilization, Cryotherapy, Moist heat, Taping, Traction, Manual therapy, and Re-evaluation.   PLAN FOR NEXT SESSION: HEP update/review PRN. Continue cervical/periscapular mobility/stability as able/appropriate  with emphasis on avoiding compensations      Leeroy Cha PT, DPT 09/12/2022 5:18 PM

## 2022-09-12 ENCOUNTER — Encounter: Payer: Self-pay | Admitting: Physical Therapy

## 2022-09-12 ENCOUNTER — Ambulatory Visit: Payer: Medicaid Other | Attending: Internal Medicine | Admitting: Physical Therapy

## 2022-09-12 DIAGNOSIS — G8929 Other chronic pain: Secondary | ICD-10-CM | POA: Diagnosis present

## 2022-09-12 DIAGNOSIS — M6281 Muscle weakness (generalized): Secondary | ICD-10-CM | POA: Diagnosis present

## 2022-09-12 DIAGNOSIS — R293 Abnormal posture: Secondary | ICD-10-CM

## 2022-09-12 DIAGNOSIS — M545 Low back pain, unspecified: Secondary | ICD-10-CM | POA: Diagnosis present

## 2022-09-12 DIAGNOSIS — M542 Cervicalgia: Secondary | ICD-10-CM | POA: Diagnosis present

## 2022-09-13 NOTE — Therapy (Signed)
OUTPATIENT PHYSICAL THERAPY TREATMENT NOTE   Patient Name: Vanessa Browning MRN: [id number] DOB:[birth year], 57 y.o., female Today's Date: 09/14/2022  PCP: Rocky Morel, DO    REFERRING PROVIDER: Reymundo Poll, MD neck, Vicente Masson MD low back  END OF SESSION:   PT End of Session - 09/14/22 1401     Visit Number 3    Number of Visits 13    Date for PT Re-Evaluation 10/11/22    Authorization Type [place] [employer]    Authorization Time Period no auth    PT Start Time 1401    PT Stop Time 1421   pt states she has to pick her granddaughter up at 2:30   PT Time Calculation (min) 20 min    Activity Tolerance Patient tolerated treatment well;No increased pain    Behavior During Therapy WFL for tasks assessed/performed              Past Medical History:  Diagnosis Date   Allergy    seasonal   GERD (gastroesophageal reflux disease)    HIV (human immunodeficiency virus infection)    Hyperlipidemia    Kidney stone    Substance abuse    Past Surgical History:  Procedure Laterality Date   APPENDECTOMY     APPENDECTOMY     CHOLECYSTECTOMY     INDUCED ABORTION     TONSILLECTOMY     Patient Active Problem List   Diagnosis Date Noted   Breast pain, left 08/07/2022   History of hyperglycemia 12/18/2021   Chronic low back pain without sciatica 07/24/2021   Generalized anxiety disorder 02/12/2020   Essential hypertension 02/12/2020   Healthcare maintenance 09/04/2018   LGSIL on Pap smear of cervix 11/06/2017   Obesity, Class I, BMI 30-34.9 01/20/2016   Hyperlipidemia 10/01/2012   Tobacco abuse 05/31/2011   Cervical herniated disc 12/09/2008   Mononeuropathy 08/19/2008   Human immunodeficiency virus (HIV) disease 01/13/2007   Allergic rhinitis 01/13/2007    REFERRING DIAG:  M50.20 (ICD-10-CM) - Cervical herniated disc                                   M54.50,G89.29 (ICD-10-CM) - Chronic bilateral low back pain without sciatica    THERAPY DIAG:  Cervicalgia  Muscle weakness (generalized)  Abnormal posture  Chronic bilateral low back pain without sciatica  Rationale for Evaluation and Treatment Rehabilitation  PERTINENT HISTORY: GERD, HIV, Hyperlipidemia, substance abuse hx, Kidney stones, Appy, cholecsystectomy, tonsilectomy   PRECAUTIONS: HIV  SUBJECTIVE:  Per eval - I have had back and neck pain for many years but it has gotten worse in the last 6 months when my numbness started in the last two fingers on RT side, and then it goes into my now both hands now. I also have pain in my low back with tingling in RT side LE painful and numbness going to mid shin. It is hard for me to lie on either side. I toss and turn every night. I wake up about every 3-4 hours at night. I can sit on a computer for 15 min but have to get up. I can stand washing dishes about 5-10 min. I do not do regular exercise. I try to chase my dog. I try to do the exercise for my back I did ,but my neck Is the main problem right now.   SUBJECTIVE STATEMENT:   Pt states she felt good relief for a while after last session, returned to baseline same day. Woke up with more pain today which she attributes to life stressors  PAIN:  Are you having pain? 10/10 neck , back doing okay   Per eval - Yes: NPRS scale: at rest my neck is 5/10 and at worst is 10/10 and my back at rest is 4/10 and at worst my back is 8/10 Pain location: neck the most and also RT side back radiating Pain description: irritating aching , numbing into RT hand and in back it runs down throbbing burning pain Aggravating factors: sleeping, doing household chores for more than 5 to 10 min, sometimes just sitting and watching TV I try to carry groceries but it hurts Relieving factors: medication makes it  better   OBJECTIVE: (objective measures completed at initial evaluation unless otherwise dated)   DIAGNOSTIC FINDINGS:  COMPARISON:  Prior MRI from 08/22/2008.   FINDINGS: Alignment: Straightening of the normal cervical lordosis. No listhesis.   Vertebrae: Vertebral body height maintained without acute or chronic fracture. Bone marrow signal intensity within normal limits. No discrete or worrisome osseous lesions or abnormal marrow edema.   Cord: Normal signal and morphology.   Posterior Fossa, vertebral arteries, paraspinal tissues: Partially empty sella noted. Visualized brain and posterior fossa otherwise unremarkable. Craniocervical junction normal. Paraspinous soft tissues within normal limits. Normal flow voids seen within the vertebral arteries bilaterally.   Disc levels:   C2-C3: Unremarkable.   C3-C4: Mild disc bulge with uncovertebral spurring. No significant spinal stenosis. Foramina remain patent.   C4-C5: Mild disc bulge with right-sided uncovertebral spurring. Superimposed tiny central disc protrusion minimally indents the ventral thecal sac (series 5, image 25). No significant spinal stenosis. Borderline mild right C5 foraminal narrowing. Left neural foramen remains patent.   C5-C6: Shallow broad-based right paracentral disc protrusion with annular fissure indents the ventral thecal sac (series 6, image 18). Mild cord flattening without cord signal changes. No significant spinal stenosis. Foramina remain patent.   C6-C7: Broad-based left eccentric disc osteophyte complex mildly flattens the ventral thecal sac. Superimposed tiny left paracentral soft disc protrusion with inferior migration (series 5, image 38). No significant spinal stenosis or cord impingement. Foramina remain adequately patent.   C7-T1: Normal interspace. Mild right-sided facet hypertrophy. No canal or foraminal stenosis.   IMPRESSION: 1. Shallow right paracentral disc protrusion  at C5-6 with resultant mild cord flattening, but no cord signal changes or significant stenosis. 2. Left eccentric disc osteophyte complex at C6-7 with superimposed tiny left paracentral disc protrusion. The ventral left C7 nerve root could be affected. 3.  Mild disc bulge with right-sided uncovertebral spurring at C4-5 with resultant borderline mild right C5 foraminal stenosis. 4. Partially empty sella. While this finding is often incidental in nature and of no clinical significance, this can also be seen in the setting of idiopathic intracranial hypertension.   PATIENT SURVEYS:  NDI 20/50 40%   SCREENING FOR RED FLAGS: Bowel or bladder incontinence: No   COGNITION: Overall cognitive status: Within functional limits for tasks assessed                          SENSATION: Pt reports numbness in RT hand c -6/7 distribution but then complains of numbness in bil whole hand and down RT LE   MUSCLE LENGTH: Hamstrings: Right WNL deg; Left WNL deg Maisie Fus test: Right  WNL deg; Left WNL deg   POSTURE:  rounded shoulders, forward head, increased lumbar lordosis, anterior pelvic tilt, and CT step off  PALPATION: No TTP in lumbar, but marked TTP in RT C-5,6,7 neck CERVICAL ROM:    Active ROM A/PROM (deg) eval  Flexion 45  Extension 39  Right lateral flexion 20  Left lateral flexion 24  Right rotation 50  Left rotation 62   (Blank rows = not tested)      UPPER EXTREMITY ROM:  WNL in BIl UE   Active ROM Right eval Left eval  Shoulder flexion      Shoulder extension      Shoulder abduction      Shoulder adduction      Shoulder extension      Shoulder internal rotation      Shoulder external rotation      Elbow flexion      Elbow extension      Wrist flexion      Wrist extension      Wrist ulnar deviation      Wrist radial deviation      Wrist pronation      Wrist supination       (Blank rows = not tested)   UPPER EXTREMITY MMT:   MMT Right eval Left eval   Shoulder flexion 4p 4  Shoulder extension      Shoulder abduction 4p 4  Shoulder adduction      Shoulder extension      Shoulder internal rotation      Shoulder external rotation      Middle trapezius      Lower trapezius      Elbow flexion      Elbow extension      Wrist flexion      Wrist extension      Wrist ulnar deviation      Wrist radial deviation      Wrist pronation      Wrist supination      Grip strength 68lb 64lb   (Blank rows = not tested)   CERVICAL SPECIAL TESTS:  Neck flexor muscle endurance test: Positive, Spurling's test: Positive, and Distraction test: Negative   LUMBAR ROM:    AROM eval  Flexion Fingertips to bil shins  Extension 10 increase RT LE pain  Right lateral flexion  WNL  Left lateral flexion WNL  Right rotation WNL  Left rotation WNL   (Blank rows = not tested)   LOWER EXTREMITY ROM:    WNL   Active  Right eval Left eval  Hip flexion      Hip extension      Hip abduction  Hip adduction      Hip internal rotation      Hip external rotation      Knee flexion      Knee extension      Ankle dorsiflexion      Ankle plantarflexion      Ankle inversion      Ankle eversion       (Blank rows = not tested)   LOWER EXTREMITY MMT:   weak abdominals  grossly  4- 4+/5   MMT Right eval Left eval  Hip flexion 4+ 4+  Hip extension      Hip abduction 4 4  Hip adduction      Hip internal rotation      Hip external rotation      Knee flexion      Knee extension      Ankle dorsiflexion      Ankle plantarflexion      Ankle inversion      Ankle eversion       (Blank rows = not tested)   LUMBAR SPECIAL TESTS:  Straight leg raise test: Negative and Slump test: Negative   FUNCTIONAL TESTS:  5 times sit to stand: 27.56 sec 6 minute walk test: TBD   GAIT: Distance walked: 150 feet Assistive device utilized: None Level of assistance: Complete Independence Comments: WFL's   TODAY'S TREATMENT:                                                                                                                               [facility] Adult PT Treatment:                                                DATE: 09/14/22 Therapeutic Exercise: UT stretch 3x30sec BIL emphasis on breath control LS stretch 3x30 sec BIL emphasis on breath control Supine chin tuck x20 cues for reduced compensations Significant time spent with education on relevant anatomy/physiology, as well as setting reminders on phone for stretching throughout day, importance of breath control   [facility] Adult PT Treatment:                                                DATE: 09/12/22 Therapeutic Exercise: Upper trap stretch 3x30sec BIL cues for breath control, appropriate form/ROM LS stretch 3x30sec BIL 3x30sec BIL cues for breath control, appropriate form/ROM  Cervical retraction, seated 2x10 cues for reduced compensations Cervical retraction supine 2x10 cues for form Scapular retraction, seated 2x10 cues for form  HEP update + education     PATIENT EDUCATION:  Education details: rationale for interventions, HEP, relevant anatomy/physiology  Person educated: Patient Education method: Explanation, Demonstration, Tactile cues, Verbal cues, and  Handouts Education comprehension: verbalized understanding, returned demonstration, verbal cues required, tactile cues required, and needs further education   HOME EXERCISE PROGRAM: Access Code: [email or web address] URL: [email or web address] Date: 09/12/2022 Prepared by: Fransisco Hertz  Exercises - Supine Deep Neck Flexor Training  - 2-3 x daily - 7 x weekly - 10 reps - 3 hold - Seated Gentle Upper Trapezius Stretch  - 1 x daily - 7 x weekly - 1 sets - 3 reps - 30 hold - Gentle Levator Scapulae Stretch  - 1 x daily - 7 x weekly - 3 sets - 3 reps - 30 hold   ASSESSMENT:   CLINICAL IMPRESSION: 09/14/2022 Pt arrives w/ 10/10 neck pain she states she woke up with, felt good after last session. Session shortened as pt states she  has to pick up granddaughter at 2:30, which is respected. Pt notes stretching has not been as relieving at home, upon review she notes she has not been as mindful with breathing which provides excellent relief during today's session. Education on breathing with stretching and setting reminders on phone to stretch throughout day for transient pain modulation. Pain reduces to 5/10 on NPS with exercises as above, pt departs in no acute distress and verbalizes good understanding of education as above.  Per eval - Patient is a 57 y.o. female previously seen for back pain  who was seen today for physical therapy evaluation and treatment for cervical radiculopathy and ongoing neck and spine issues. Pt symptoms compatible with cervical radiculopathy and will benefit from skilled PT to address issues with sleeping and being able to perform household duties at home with decreased pain in neck.    OBJECTIVE IMPAIRMENTS: decreased ROM, decreased strength, impaired sensation, impaired UE functional use, improper body mechanics, postural dysfunction, obesity, and pain.    ACTIVITY LIMITATIONS: carrying, lifting, bending, sitting, standing, sleeping, and reach over head   PARTICIPATION LIMITATIONS: cleaning, laundry, and driving   PERSONAL FACTORS: GERD, HIV, Hyperlipidemia, substance abuse hx, Kidney stones, Appy, cholecsystectomy, tonsilectomy are also affecting patient's functional outcome.    REHAB POTENTIAL: Good   CLINICAL DECISION MAKING: Evolving/moderate complexity   EVALUATION COMPLEXITY: Moderate     GOALS: Goals reviewed with patient? Yes   SHORT TERM GOALS: Target date: 08-18-22   Pt will be independent with Initial HEP Baseline: Goal status: INITIAL   2.  Pt will be able to reduce pain with movement  and worst pain from 10/10 to at least 6/10 Baseline: eval 10/10 at worst Goal status: INITIAL   3.  Pt will be able to demonstrate supine neck flexion test for at least 35 sec  to show  improved strength Baseline: eval 18 sec  Goal status: INITIAL   4.Improved cervical rotation to allow checking blind spots while driving with minimal discomfort Baseline: See AROM flow chart Goal status: INITIAL       LONG TERM GOALS: Target date: 10-11-22   Pt  will be independent with advanced HEP Baseline:  Goal status: INITIAL   2.  Pt NDI will improve at least 10 point from 20/50 to 10/50 Baseline: 20/50 40% Goal status: INITIAL   3.  Pt will be able to utilize sleep positions to maximize sleep for 4 or more hours of restorative consecutive sleep Baseline:  Pt wakes every 1-2 hours a night and cannot sleep on sides due to pain Goal status: INITIAL   4.  Pt will be able to increase Cervical AROM to within 5 degrees of RT/LT Baseline: See flow chart  AROM cervical Goal status: INITIAL   5.  Pt will be able to pick up 15 pounds/simulating groceries from the floor with minimal Neck pain Baseline: Pt avoids lifting anything do to pain Goal status: INITIAL   6.  Pt will be able to lift arms overhead to do household chores without out exacerbating neck pain Baseline:  Goal status: INITIAL   PLAN:   PT FREQUENCY: 1-2x/week   PT DURATION: 6 weeks   PLANNED INTERVENTIONS: Therapeutic exercises, Therapeutic activity, Neuromuscular re-education, Balance training, Gait training, Patient/Family education, Self Care, Joint mobilization, Dry Needling, Electrical stimulation, Spinal mobilization, Cryotherapy, Moist heat, Taping, Traction, Manual therapy, and Re-evaluation.   PLAN FOR NEXT SESSION: HEP update/review PRN. Continue cervical/periscapular mobility/stability as able/appropriate  with emphasis on avoiding compensations      Ashley Murrain PT, DPT 09/14/2022 2:28 PM

## 2022-09-14 ENCOUNTER — Encounter: Payer: Self-pay | Admitting: Physical Therapy

## 2022-09-14 ENCOUNTER — Ambulatory Visit: Payer: Medicaid Other | Admitting: Physical Therapy

## 2022-09-14 DIAGNOSIS — G8929 Other chronic pain: Secondary | ICD-10-CM

## 2022-09-14 DIAGNOSIS — M6281 Muscle weakness (generalized): Secondary | ICD-10-CM

## 2022-09-14 DIAGNOSIS — M542 Cervicalgia: Secondary | ICD-10-CM

## 2022-09-14 DIAGNOSIS — R293 Abnormal posture: Secondary | ICD-10-CM

## 2022-09-19 ENCOUNTER — Ambulatory Visit: Payer: Medicaid Other | Admitting: Physical Therapy

## 2022-09-19 ENCOUNTER — Telehealth: Payer: Self-pay | Admitting: Physical Therapy

## 2022-09-19 NOTE — Telephone Encounter (Signed)
Called pt re: this afternoon's missed appt - able to speak with her, confirmed next appt and advised on attendance policy

## 2022-09-20 NOTE — Therapy (Signed)
OUTPATIENT PHYSICAL THERAPY TREATMENT NOTE   Patient Name: Vanessa Browning MRN: [id number] DOB:02-Jan-1966, 57 y.o., female Today's Date: 09/21/2022  PCP: Rocky Morel, DO    REFERRING PROVIDER: Reymundo Poll, MD neck, Vicente Masson MD low back  END OF SESSION:   PT End of Session - 09/21/22 1357     Visit Number 4    Number of Visits 13    Date for PT Re-Evaluation 10/11/22    Authorization Type [place] [employer]ICA[employer]    Authorization Time Period no auth    PT Start Time 1359    PT Stop Time 1440    PT Time Calculation (min) 41 min    Activity Tolerance Patient tolerated treatment well;No increased pain    Behavior During Therapy WFL for tasks assessed/performed               Past Medical History:  Diagnosis Date   Allergy    seasonal   GERD (gastroesophageal reflux disease)    HIV (human immunodeficiency virus infection)    Hyperlipidemia    Kidney stone    Substance abuse    Past Surgical History:  Procedure Laterality Date   APPENDECTOMY     APPENDECTOMY     CHOLECYSTECTOMY     INDUCED ABORTION     TONSILLECTOMY     Patient Active Problem List   Diagnosis Date Noted   Breast pain, left 08/07/2022   History of hyperglycemia 12/18/2021   Chronic low back pain without sciatica 07/24/2021   Generalized anxiety disorder 02/12/2020   Essential hypertension 02/12/2020   Healthcare maintenance 09/04/2018   LGSIL on Pap smear of cervix 11/06/2017   Obesity, Class I, BMI 30-34.9 01/20/2016   Hyperlipidemia 10/01/2012   Tobacco abuse 05/31/2011   Cervical herniated disc 12/09/2008   Mononeuropathy 08/19/2008   Human immunodeficiency virus (HIV) disease 01/13/2007   Allergic rhinitis 01/13/2007    REFERRING DIAG:  M50.20 (ICD-10-CM) - Cervical herniated disc                                   M54.50,G89.29 (ICD-10-CM) - Chronic bilateral low back pain without sciatica   THERAPY DIAG:  Cervicalgia  Muscle weakness  (generalized)  Abnormal posture  Chronic bilateral low back pain without sciatica  Rationale for Evaluation and Treatment Rehabilitation  PERTINENT HISTORY: GERD, HIV, Hyperlipidemia, substance abuse hx, Kidney stones, Appy, cholecsystectomy, tonsilectomy   PRECAUTIONS: HIV  SUBJECTIVE:  Per eval - I have had back and neck pain for many years but it has gotten worse in the last 6 months when my numbness started in the last two fingers on RT side, and then it goes into my now both hands now. I also have pain in my low back with tingling in RT side LE painful and numbness going to mid shin. It is hard for me to lie on either side. I toss and turn every night. I wake up about every 3-4 hours at night. I can sit on a computer for 15 min but have to get up. I can stand washing dishes about 5-10 min. I do not do regular exercise. I try to chase my dog. I try to do the exercise for my back I did ,but my neck Is the main problem right now.   SUBJECTIVE STATEMENT:   Pt states she did have some significant pain the past couple days but is feeling pretty good now which she attributes to medication earlier today. States HEP gives her transient relief   PAIN:  Are you having pain? 3/10 neck ,no back pain  Per eval - Yes: NPRS scale: at rest my neck is 5/10 and at worst is 10/10 and my back at rest is 4/10 and at worst my back is 8/10 Pain location: neck the most and also RT side back radiating Pain description: irritating aching , numbing into RT hand and in back it runs down throbbing burning pain Aggravating factors: sleeping, doing household chores for more than 5 to 10 min, sometimes just sitting and watching TV I try to carry groceries but it hurts Relieving factors: medication makes it better   OBJECTIVE: (objective  measures completed at initial evaluation unless otherwise dated)   DIAGNOSTIC FINDINGS:  COMPARISON:  Prior MRI from 08/22/2008.   FINDINGS: Alignment: Straightening of the normal cervical lordosis. No listhesis.   Vertebrae: Vertebral body height maintained without acute or chronic fracture. Bone marrow signal intensity within normal limits. No discrete or worrisome osseous lesions or abnormal marrow edema.   Cord: Normal signal and morphology.   Posterior Fossa, vertebral arteries, paraspinal tissues: Partially empty sella noted. Visualized brain and posterior fossa otherwise unremarkable. Craniocervical junction normal. Paraspinous soft tissues within normal limits. Normal flow voids seen within the vertebral arteries bilaterally.   Disc levels:   C2-C3: Unremarkable.   C3-C4: Mild disc bulge with uncovertebral spurring. No significant spinal stenosis. Foramina remain patent.   C4-C5: Mild disc bulge with right-sided uncovertebral spurring. Superimposed tiny central disc protrusion minimally indents the ventral thecal sac (series 5, image 25). No significant spinal stenosis. Borderline mild right C5 foraminal narrowing. Left neural foramen remains patent.   C5-C6: Shallow broad-based right paracentral disc protrusion with annular fissure indents the ventral thecal sac (series 6, image 18). Mild cord flattening without cord signal changes. No significant spinal stenosis. Foramina remain patent.   C6-C7: Broad-based left eccentric disc osteophyte complex mildly flattens the ventral thecal sac. Superimposed tiny left paracentral soft disc protrusion with inferior migration (series 5, image 38). No significant spinal stenosis or cord impingement. Foramina remain adequately patent.   C7-T1: Normal interspace. Mild right-sided facet hypertrophy. No canal or foraminal stenosis.   IMPRESSION: 1. Shallow right paracentral disc protrusion at C5-6 with resultant mild cord  flattening, but no cord signal changes or significant stenosis. 2. Left eccentric disc osteophyte complex at C6-7 with superimposed tiny left paracentral disc protrusion. The ventral left C7 nerve root could be affected.  3. Mild disc bulge with right-sided uncovertebral spurring at C4-5 with resultant borderline mild right C5 foraminal stenosis. 4. Partially empty sella. While this finding is often incidental in nature and of no clinical significance, this can also be seen in the setting of idiopathic intracranial hypertension.   PATIENT SURVEYS:  NDI 20/50 40%   SCREENING FOR RED FLAGS: Bowel or bladder incontinence: No   COGNITION: Overall cognitive status: Within functional limits for tasks assessed                          SENSATION: Pt reports numbness in RT hand c -6/7 distribution but then complains of numbness in bil whole hand and down RT LE   MUSCLE LENGTH: Hamstrings: Right WNL deg; Left WNL deg Maisie Fushomas test: Right  WNL deg; Left WNL deg   POSTURE:  rounded shoulders, forward head, increased lumbar lordosis, anterior pelvic tilt, and CT step off  PALPATION: No TTP in lumbar, but marked TTP in RT C-5,6,7 neck CERVICAL ROM:    Active ROM A/PROM (deg) eval 09/21/22 AROM  Flexion 45   Extension 39   Right lateral flexion 20   Left lateral flexion 24   Right rotation 50 54 deg  Left rotation 62 65 deg   (Blank rows = not tested)      UPPER EXTREMITY ROM:  WNL in BIl UE   Active ROM Right eval Left eval  Shoulder flexion      Shoulder extension      Shoulder abduction      Shoulder adduction      Shoulder extension      Shoulder internal rotation      Shoulder external rotation      Elbow flexion      Elbow extension      Wrist flexion      Wrist extension      Wrist ulnar deviation      Wrist radial deviation      Wrist pronation      Wrist supination       (Blank rows = not tested)   UPPER EXTREMITY MMT:   MMT Right eval Left eval  Shoulder  flexion 4p 4  Shoulder extension      Shoulder abduction 4p 4  Shoulder adduction      Shoulder extension      Shoulder internal rotation      Shoulder external rotation      Middle trapezius      Lower trapezius      Elbow flexion      Elbow extension      Wrist flexion      Wrist extension      Wrist ulnar deviation      Wrist radial deviation      Wrist pronation      Wrist supination      Grip strength 68lb 64lb   (Blank rows = not tested)   CERVICAL SPECIAL TESTS:  Neck flexor muscle endurance test: Positive, Spurling's test: Positive, and Distraction test: Negative   LUMBAR ROM:    AROM eval  Flexion Fingertips to bil shins  Extension 10 increase RT LE pain  Right lateral flexion  WNL  Left lateral flexion WNL  Right rotation WNL  Left rotation WNL   (Blank rows = not tested)   LOWER EXTREMITY ROM:    WNL   Active  Right eval Left eval  Hip flexion  Hip extension      Hip abduction      Hip adduction      Hip internal rotation      Hip external rotation      Knee flexion      Knee extension      Ankle dorsiflexion      Ankle plantarflexion      Ankle inversion      Ankle eversion       (Blank rows = not tested)   LOWER EXTREMITY MMT:   weak abdominals  grossly  4- 4+/5   MMT Right eval Left eval  Hip flexion 4+ 4+  Hip extension      Hip abduction 4 4  Hip adduction      Hip internal rotation      Hip external rotation      Knee flexion      Knee extension      Ankle dorsiflexion      Ankle plantarflexion      Ankle inversion      Ankle eversion       (Blank rows = not tested)   LUMBAR SPECIAL TESTS:  Straight leg raise test: Negative and Slump test: Negative   FUNCTIONAL TESTS:  5 times sit to stand: 27.56 sec 6 minute walk test: TBD   GAIT: Distance walked: 150 feet Assistive device utilized: None Level of assistance: Complete Independence Comments: WFL's   TODAY'S TREATMENT:                                                                                                                               [facility] Adult PT Treatment:                                                DATE: 09/21/22 Therapeutic Exercise: Supine shoulder flexion AROM x8 cues for appropriate ROM  Supine YTB horizontal abduction 2x8 cues for appropriate alignment and pacing Supine chin tuck 2x12 cues for form and reduced time holding Seated YTB double ER + scap retraction 2x15 cues for form and posture  Scapular retraction 3x10 cues for reduced UT compensations  LS stretch 3x30sec BIL cues for breath control UT stretch 3x30sec BIL cues for pacing HEP update + handout/education   [facility] Adult PT Treatment:                                                DATE: 09/14/22 Therapeutic Exercise: UT stretch 3x30sec BIL emphasis on breath control LS stretch 3x30 sec BIL emphasis on breath control Supine chin tuck x20 cues for reduced compensations Significant time spent with education on relevant anatomy/physiology, as well as setting reminders on  phone for stretching throughout day, importance of breath control   [facility] Adult PT Treatment:                                                DATE: 09/12/22 Therapeutic Exercise: Upper trap stretch 3x30sec BIL cues for breath control, appropriate form/ROM LS stretch 3x30sec BIL 3x30sec BIL cues for breath control, appropriate form/ROM  Cervical retraction, seated 2x10 cues for reduced compensations Cervical retraction supine 2x10 cues for form Scapular retraction, seated 2x10 cues for form  HEP update + education     PATIENT EDUCATION:  Education details: rationale for interventions, HEP, relevant anatomy/physiology  Person educated: Patient Education method: Explanation, Demonstration, Tactile cues, Verbal cues, and Handouts Education comprehension: verbalized understanding, returned demonstration, verbal cues required, tactile cues required, and needs further education   HOME EXERCISE PROGRAM: Access  Code: [email or web address] URL: [email or web address] Date: 09/21/2022 Prepared by: Fransisco Hertz  Exercises - Supine Deep Neck Flexor Training  - 2-3 x daily - 7 x weekly - 10 reps - 3 hold - Seated Gentle Upper Trapezius Stretch  - 1 x daily - 7 x weekly - 1 sets - 3 reps - 30 hold - Gentle Levator Scapulae Stretch  - 1 x daily - 7 x weekly - 3 sets - 3 reps - 30 hold - Supine Shoulder Flexion Extension Full Range AROM  - 1 x daily - 7 x weekly - 2 sets - 8 reps   ASSESSMENT:   CLINICAL IMPRESSION: 09/21/2022 Pt arrives w/ 3/10 pain at present which she attributes to medications earlier this morning, has had increased pain the past few days. States HEP continues to provide transient relief. Today focusing on progression of periscapular/cervical stability and cervicothoracic mobility as tolerated. Pt endorses improved stiffness with repetition, good relief. Modest improvement in cervical rotation compared to initial evaluation. HEP updated as above. Pt departs with report of 0/10 pain after intervention and improved stiffness. Pt departs today's session in no acute distress, all voiced questions/concerns addressed appropriately from PT perspective.     Per eval - Patient is a 57 y.o. female previously seen for back pain  who was seen today for physical therapy evaluation and treatment for cervical radiculopathy and ongoing neck and spine issues. Pt symptoms compatible with cervical radiculopathy and will benefit from skilled PT to address issues with sleeping and being able to perform household duties at home with decreased pain in neck.    OBJECTIVE IMPAIRMENTS: decreased ROM, decreased strength, impaired sensation, impaired UE functional use, improper body mechanics, postural dysfunction, obesity, and pain.    ACTIVITY LIMITATIONS: carrying, lifting, bending, sitting, standing, sleeping, and reach over head   PARTICIPATION LIMITATIONS: cleaning, laundry, and driving   PERSONAL FACTORS:  GERD, HIV, Hyperlipidemia, substance abuse hx, Kidney stones, Appy, cholecsystectomy, tonsilectomy are also affecting patient's functional outcome.    REHAB POTENTIAL: Good   CLINICAL DECISION MAKING: Evolving/moderate complexity   EVALUATION COMPLEXITY: Moderate     GOALS: Goals reviewed with patient? Yes   SHORT TERM GOALS: Target date: 08-18-22   Pt will be independent with Initial HEP Baseline: Goal status: INITIAL   2.  Pt will be able to reduce pain with movement  and worst pain from 10/10 to at least 6/10 Baseline: eval 10/10 at worst Goal status: INITIAL   3.  Pt will be able to demonstrate supine neck  flexion test for at least 35 sec  to show improved strength Baseline: eval 18 sec  Goal status: INITIAL   4.Improved cervical rotation to allow checking blind spots while driving with minimal discomfort Baseline: See AROM flow chart Goal status: INITIAL       LONG TERM GOALS: Target date: 10-11-22   Pt  will be independent with advanced HEP Baseline:  Goal status: INITIAL   2.  Pt NDI will improve at least 10 point from 20/50 to 10/50 Baseline: 20/50 40% Goal status: INITIAL   3.  Pt will be able to utilize sleep positions to maximize sleep for 4 or more hours of restorative consecutive sleep Baseline:  Pt wakes every 1-2 hours a night and cannot sleep on sides due to pain Goal status: INITIAL   4.  Pt will be able to increase Cervical AROM to within 5 degrees of RT/LT Baseline: See flow chart AROM cervical Goal status: INITIAL   5.  Pt will be able to pick up 15 pounds/simulating groceries from the floor with minimal Neck pain Baseline: Pt avoids lifting anything do to pain Goal status: INITIAL   6.  Pt will be able to lift arms overhead to do household chores without out exacerbating neck pain Baseline:  Goal status: INITIAL   PLAN:   PT FREQUENCY: 1-2x/week   PT DURATION: 6 weeks   PLANNED INTERVENTIONS: Therapeutic exercises, Therapeutic activity,  Neuromuscular re-education, Balance training, Gait training, Patient/Family education, Self Care, Joint mobilization, Dry Needling, Electrical stimulation, Spinal mobilization, Cryotherapy, Moist heat, Taping, Traction, Manual therapy, and Re-evaluation.   PLAN FOR NEXT SESSION: HEP update/review PRN. Continue cervical/periscapular mobility/stability as able/appropriate  with emphasis on avoiding compensations       Ashley Murrain PT, DPT 09/21/2022 2:46 PM

## 2022-09-21 ENCOUNTER — Ambulatory Visit: Payer: Medicaid Other | Admitting: Physical Therapy

## 2022-09-21 ENCOUNTER — Encounter: Payer: Self-pay | Admitting: Physical Therapy

## 2022-09-21 DIAGNOSIS — M545 Low back pain, unspecified: Secondary | ICD-10-CM

## 2022-09-21 DIAGNOSIS — M542 Cervicalgia: Secondary | ICD-10-CM

## 2022-09-21 DIAGNOSIS — M6281 Muscle weakness (generalized): Secondary | ICD-10-CM

## 2022-09-21 DIAGNOSIS — R293 Abnormal posture: Secondary | ICD-10-CM

## 2022-09-26 ENCOUNTER — Ambulatory Visit: Payer: Medicaid Other | Admitting: Physical Therapy

## 2022-09-27 NOTE — Therapy (Signed)
OUTPATIENT PHYSICAL THERAPY TREATMENT NOTE   Patient Name: Vanessa Browning MRN: [id number] DOB:[birth year], 57 y.o., female Today's Date: 09/28/2022  PCP: Rocky Morel, DO    REFERRING PROVIDER: Reymundo Poll, MD neck, Vicente Masson MD low back  END OF SESSION:   PT End of Session - 09/28/22 1407     Visit Number 5    Number of Visits 13    Date for PT Re-Evaluation 10/11/22    Authorization Type [place] [employer]    Authorization Time Period no auth    PT Start Time 1407    PT Stop Time 1450    PT Time Calculation (min) 43 min    Activity Tolerance Patient tolerated treatment well;No increased pain    Behavior During Therapy WFL for tasks assessed/performed                Past Medical History:  Diagnosis Date   Allergy    seasonal   GERD (gastroesophageal reflux disease)    HIV (human immunodeficiency virus infection)    Hyperlipidemia    Kidney stone    Substance abuse    Past Surgical History:  Procedure Laterality Date   APPENDECTOMY     APPENDECTOMY     CHOLECYSTECTOMY     INDUCED ABORTION     TONSILLECTOMY     Patient Active Problem List   Diagnosis Date Noted   Breast pain, left 08/07/2022   History of hyperglycemia 12/18/2021   Chronic low back pain without sciatica 07/24/2021   Generalized anxiety disorder 02/12/2020   Essential hypertension 02/12/2020   Healthcare maintenance 09/04/2018   LGSIL on Pap smear of cervix 11/06/2017   Obesity, Class I, BMI 30-34.9 01/20/2016   Hyperlipidemia 10/01/2012   Tobacco abuse 05/31/2011   Cervical herniated disc 12/09/2008   Mononeuropathy 08/19/2008   Human immunodeficiency virus (HIV) disease 01/13/2007   Allergic rhinitis 01/13/2007    REFERRING DIAG:  M50.20 (ICD-10-CM) - Cervical herniated disc                                   M54.50,G89.29 (ICD-10-CM) - Chronic bilateral low back pain without sciatica   THERAPY DIAG:  Cervicalgia  Muscle weakness  (generalized)  Abnormal posture  Chronic bilateral low back pain without sciatica  Rationale for Evaluation and Treatment Rehabilitation  PERTINENT HISTORY: GERD, HIV, Hyperlipidemia, substance abuse hx, Kidney stones, Appy, cholecsystectomy, tonsilectomy   PRECAUTIONS: HIV  SUBJECTIVE:  Per eval - I have had back and neck pain for many years but it has gotten worse in the last 6 months when my numbness started in the last two fingers on RT side, and then it goes into my now both hands now. I also have pain in my low back with tingling in RT side LE painful and numbness going to mid shin. It is hard for me to lie on either side. I toss and turn every night. I wake up about every 3-4 hours at night. I can sit on a computer for 15 min but have to get up. I can stand washing dishes about 5-10 min. I do not do regular exercise. I try to chase my dog. I try to do the exercise for my back I did ,but my neck Is the main problem right now.   SUBJECTIVE STATEMENT:   Pt endorses 1/10 neck pain at present, feeling better and is managing symptoms with HEP. No soreness after last session   PAIN:  Are you having pain? 1/10 neck ,no back pain  Per eval - Yes: NPRS scale: at rest my neck is 5/10 and at worst is 10/10 and my back at rest is 4/10 and at worst my back is 8/10 Pain location: neck the most and also RT side back radiating Pain description: irritating aching , numbing into RT hand and in back it runs down throbbing burning pain Aggravating factors: sleeping, doing household chores for more than 5 to 10 min, sometimes just sitting and watching TV I try to carry groceries but it hurts Relieving factors: medication makes it better   OBJECTIVE: (objective measures completed at initial evaluation unless otherwise  dated)   DIAGNOSTIC FINDINGS:  COMPARISON:  Prior MRI from 08/22/2008.   FINDINGS: Alignment: Straightening of the normal cervical lordosis. No listhesis.   Vertebrae: Vertebral body height maintained without acute or chronic fracture. Bone marrow signal intensity within normal limits. No discrete or worrisome osseous lesions or abnormal marrow edema.   Cord: Normal signal and morphology.   Posterior Fossa, vertebral arteries, paraspinal tissues: Partially empty sella noted. Visualized brain and posterior fossa otherwise unremarkable. Craniocervical junction normal. Paraspinous soft tissues within normal limits. Normal flow voids seen within the vertebral arteries bilaterally.   Disc levels:   C2-C3: Unremarkable.   C3-C4: Mild disc bulge with uncovertebral spurring. No significant spinal stenosis. Foramina remain patent.   C4-C5: Mild disc bulge with right-sided uncovertebral spurring. Superimposed tiny central disc protrusion minimally indents the ventral thecal sac (series 5, image 25). No significant spinal stenosis. Borderline mild right C5 foraminal narrowing. Left neural foramen remains patent.   C5-C6: Shallow broad-based right paracentral disc protrusion with annular fissure indents the ventral thecal sac (series 6, image 18). Mild cord flattening without cord signal changes. No significant spinal stenosis. Foramina remain patent.   C6-C7: Broad-based left eccentric disc osteophyte complex mildly flattens the ventral thecal sac. Superimposed tiny left paracentral soft disc protrusion with inferior migration (series 5, image 38). No significant spinal stenosis or cord impingement. Foramina remain adequately patent.   C7-T1: Normal interspace. Mild right-sided facet hypertrophy. No canal or foraminal stenosis.   IMPRESSION: 1. Shallow right paracentral disc protrusion at C5-6 with resultant mild cord flattening, but no cord signal changes or  significant stenosis. 2. Left eccentric disc osteophyte complex at C6-7 with superimposed tiny left paracentral disc protrusion. The ventral left C7 nerve root could be affected. 3. Mild disc bulge with right-sided uncovertebral spurring at C4-5 with  resultant borderline mild right C5 foraminal stenosis. 4. Partially empty sella. While this finding is often incidental in nature and of no clinical significance, this can also be seen in the setting of idiopathic intracranial hypertension.   PATIENT SURVEYS:  NDI 20/50 40%   SCREENING FOR RED FLAGS: Bowel or bladder incontinence: No   COGNITION: Overall cognitive status: Within functional limits for tasks assessed                          SENSATION: Pt reports numbness in RT hand c -6/7 distribution but then complains of numbness in bil whole hand and down RT LE   MUSCLE LENGTH: Hamstrings: Right WNL deg; Left WNL deg Maisie Fus test: Right  WNL deg; Left WNL deg   POSTURE:  rounded shoulders, forward head, increased lumbar lordosis, anterior pelvic tilt, and CT step off  PALPATION: No TTP in lumbar, but marked TTP in RT C-5,6,7 neck CERVICAL ROM:    Active ROM A/PROM (deg) eval 09/21/22 AROM  Flexion 45   Extension 39   Right lateral flexion 20   Left lateral flexion 24   Right rotation 50 54 deg  Left rotation 62 65 deg   (Blank rows = not tested)      UPPER EXTREMITY ROM:  WNL in BIl UE   Active ROM Right eval Left eval  Shoulder flexion      Shoulder extension      Shoulder abduction      Shoulder adduction      Shoulder extension      Shoulder internal rotation      Shoulder external rotation      Elbow flexion      Elbow extension      Wrist flexion      Wrist extension      Wrist ulnar deviation      Wrist radial deviation      Wrist pronation      Wrist supination       (Blank rows = not tested)   UPPER EXTREMITY MMT:   MMT Right eval Left eval  Shoulder flexion 4p 4  Shoulder extension       Shoulder abduction 4p 4  Shoulder adduction      Shoulder extension      Shoulder internal rotation      Shoulder external rotation      Middle trapezius      Lower trapezius      Elbow flexion      Elbow extension      Wrist flexion      Wrist extension      Wrist ulnar deviation      Wrist radial deviation      Wrist pronation      Wrist supination      Grip strength 68lb 64lb   (Blank rows = not tested)   CERVICAL SPECIAL TESTS:  Neck flexor muscle endurance test: Positive, Spurling's test: Positive, and Distraction test: Negative   LUMBAR ROM:    AROM eval  Flexion Fingertips to bil shins  Extension 10 increase RT LE pain  Right lateral flexion  WNL  Left lateral flexion WNL  Right rotation WNL  Left rotation WNL   (Blank rows = not tested)   LOWER EXTREMITY ROM:    WNL   Active  Right eval Left eval  Hip flexion      Hip extension      Hip abduction  Hip adduction      Hip internal rotation      Hip external rotation      Knee flexion      Knee extension      Ankle dorsiflexion      Ankle plantarflexion      Ankle inversion      Ankle eversion       (Blank rows = not tested)   LOWER EXTREMITY MMT:   weak abdominals  grossly  4- 4+/5   MMT Right eval Left eval  Hip flexion 4+ 4+  Hip extension      Hip abduction 4 4  Hip adduction      Hip internal rotation      Hip external rotation      Knee flexion      Knee extension      Ankle dorsiflexion      Ankle plantarflexion      Ankle inversion      Ankle eversion       (Blank rows = not tested)   LUMBAR SPECIAL TESTS:  Straight leg raise test: Negative and Slump test: Negative   FUNCTIONAL TESTS:  5 times sit to stand: 27.56 sec 6 minute walk test: TBD   GAIT: Distance walked: 150 feet Assistive device utilized: None Level of assistance: Complete Independence Comments: WFL's   TODAY'S TREATMENT:                                                                                                                               [facility] Adult PT Treatment:                                                DATE: 09/28/22 Therapeutic Exercise: RTB horizontal abd, supine, 2x8 cues for form and appropriate alignment  Supine GH flexion 2x10 cues for pacing Seated RTB B ER + scapular retraction 2x8 cues for posture and reduced UT activation Swiss ball GH flexion up wall emphasis on T spine extension x8 cues for form, neck positioning Seated swiss ball press down 2x10 cues for form and breath control, posture Supine serratus punch 2# 2x8 BIL cues for reduced elbow compensations and improved velocity  Supine chin tucks 2x15  HEP review, provided w/ RTB in anticipation of prescribing banded exercise for HEP in next couple visits   [facility] Adult PT Treatment:                                                DATE: 09/21/22 Therapeutic Exercise: Supine shoulder flexion AROM x8 cues for appropriate ROM  Supine YTB horizontal abduction 2x8 cues for appropriate alignment and pacing Supine chin tuck 2x12 cues  for form and reduced time holding Seated YTB double ER + scap retraction 2x15 cues for form and posture  Scapular retraction 3x10 cues for reduced UT compensations  LS stretch 3x30sec BIL cues for breath control UT stretch 3x30sec BIL cues for pacing HEP update + handout/education   [facility] Adult PT Treatment:                                                DATE: 09/14/22 Therapeutic Exercise: UT stretch 3x30sec BIL emphasis on breath control LS stretch 3x30 sec BIL emphasis on breath control Supine chin tuck x20 cues for reduced compensations Significant time spent with education on relevant anatomy/physiology, as well as setting reminders on phone for stretching throughout day, importance of breath control      PATIENT EDUCATION:  Education details: rationale for interventions, HEP, relevant anatomy/physiology  Person educated: Patient Education method: Explanation, Demonstration, Tactile  cues, Verbal cues, and Handouts Education comprehension: verbalized understanding, returned demonstration, verbal cues required, tactile cues required, and needs further education   HOME EXERCISE PROGRAM: Access Code: [email or web address] URL: [email or web address] Date: 09/21/2022 Prepared by: Fransisco Hertz  Exercises - Supine Deep Neck Flexor Training  - 2-3 x daily - 7 x weekly - 10 reps - 3 hold - Seated Gentle Upper Trapezius Stretch  - 1 x daily - 7 x weekly - 1 sets - 3 reps - 30 hold - Gentle Levator Scapulae Stretch  - 1 x daily - 7 x weekly - 3 sets - 3 reps - 30 hold - Supine Shoulder Flexion Extension Full Range AROM  - 1 x daily - 7 x weekly - 2 sets - 8 reps   ASSESSMENT:   CLINICAL IMPRESSION: 09/28/2022 Pt arrives w/ 1/10 pain (without medication) and states she felt good after last session, HEP has been helpful with managing symptoms. Today focusing  on increasing time spent with resistance training which pt tolerates quite well. Demonstrates preference for more thoracic extension based movements, particularly when paired with GH flexion. Pt reports 0/10 pain on departure, primary of muscular fatigue with strengthening that resolves w/ stretching. No adverse events. Recommend continuing along current POC in order to address relevant deficits and improve functional tolerance. Pt departs today's session in no acute distress, all voiced questions/concerns addressed appropriately from PT perspective.      Per eval - Patient is a 57 y.o. female previously seen for back pain  who was seen today for physical therapy evaluation and treatment for cervical radiculopathy and ongoing neck and spine issues. Pt symptoms compatible with cervical radiculopathy and will benefit from skilled PT to address issues with sleeping and being able to perform household duties at home with decreased pain in neck.    OBJECTIVE IMPAIRMENTS: decreased ROM, decreased strength, impaired sensation, impaired  UE functional use, improper body mechanics, postural dysfunction, obesity, and pain.    ACTIVITY LIMITATIONS: carrying, lifting, bending, sitting, standing, sleeping, and reach over head   PARTICIPATION LIMITATIONS: cleaning, laundry, and driving   PERSONAL FACTORS: GERD, HIV, Hyperlipidemia, substance abuse hx, Kidney stones, Appy, cholecsystectomy, tonsilectomy are also affecting patient's functional outcome.    REHAB POTENTIAL: Good   CLINICAL DECISION MAKING: Evolving/moderate complexity   EVALUATION COMPLEXITY: Moderate     GOALS: Goals reviewed with patient? Yes   SHORT TERM GOALS: Target date: 08-18-22   Pt will be independent with Initial  HEP Baseline: Goal status: INITIAL   2.  Pt will be able to reduce pain with movement  and worst pain from 10/10 to at least 6/10 Baseline: eval 10/10 at worst Goal status: INITIAL   3.  Pt will be able to demonstrate supine neck flexion test for at least 35 sec  to show improved strength Baseline: eval 18 sec  Goal status: INITIAL   4.Improved cervical rotation to allow checking blind spots while driving with minimal discomfort Baseline: See AROM flow chart Goal status: INITIAL       LONG TERM GOALS: Target date: 10-11-22   Pt  will be independent with advanced HEP Baseline:  Goal status: INITIAL   2.  Pt NDI will improve at least 10 point from 20/50 to 10/50 Baseline: 20/50 40% Goal status: INITIAL   3.  Pt will be able to utilize sleep positions to maximize sleep for 4 or more hours of restorative consecutive sleep Baseline:  Pt wakes every 1-2 hours a night and cannot sleep on sides due to pain Goal status: INITIAL   4.  Pt will be able to increase Cervical AROM to within 5 degrees of RT/LT Baseline: See flow chart AROM cervical Goal status: INITIAL   5.  Pt will be able to pick up 15 pounds/simulating groceries from the floor with minimal Neck pain Baseline: Pt avoids lifting anything do to pain Goal status:  INITIAL   6.  Pt will be able to lift arms overhead to do household chores without out exacerbating neck pain Baseline:  Goal status: INITIAL   PLAN:   PT FREQUENCY: 1-2x/week   PT DURATION: 6 weeks   PLANNED INTERVENTIONS: Therapeutic exercises, Therapeutic activity, Neuromuscular re-education, Balance training, Gait training, Patient/Family education, Self Care, Joint mobilization, Dry Needling, Electrical stimulation, Spinal mobilization, Cryotherapy, Moist heat, Taping, Traction, Manual therapy, and Re-evaluation.   PLAN FOR NEXT SESSION: HEP update/review PRN. Continue cervical/periscapular mobility/stability as able/appropriate  with emphasis on avoiding compensations        Ashley Murrain PT, DPT 09/28/2022 2:56 PM

## 2022-09-28 ENCOUNTER — Ambulatory Visit: Payer: Medicaid Other | Admitting: Physical Therapy

## 2022-09-28 ENCOUNTER — Encounter: Payer: Self-pay | Admitting: Physical Therapy

## 2022-09-28 DIAGNOSIS — M542 Cervicalgia: Secondary | ICD-10-CM | POA: Diagnosis not present

## 2022-09-28 DIAGNOSIS — R293 Abnormal posture: Secondary | ICD-10-CM

## 2022-09-28 DIAGNOSIS — M6281 Muscle weakness (generalized): Secondary | ICD-10-CM

## 2022-09-28 DIAGNOSIS — M545 Low back pain, unspecified: Secondary | ICD-10-CM

## 2022-10-02 ENCOUNTER — Encounter: Payer: Self-pay | Admitting: Family

## 2022-10-02 ENCOUNTER — Other Ambulatory Visit: Payer: Self-pay

## 2022-10-02 ENCOUNTER — Ambulatory Visit (INDEPENDENT_AMBULATORY_CARE_PROVIDER_SITE_OTHER): Payer: Medicaid Other | Admitting: Family

## 2022-10-02 VITALS — BP 108/73 | HR 74 | Temp 97.9°F | Resp 16 | Wt 185.0 lb

## 2022-10-02 DIAGNOSIS — Z79899 Other long term (current) drug therapy: Secondary | ICD-10-CM

## 2022-10-02 DIAGNOSIS — F17210 Nicotine dependence, cigarettes, uncomplicated: Secondary | ICD-10-CM | POA: Diagnosis not present

## 2022-10-02 DIAGNOSIS — Z720 Tobacco use: Secondary | ICD-10-CM

## 2022-10-02 DIAGNOSIS — Z0000 Encounter for general adult medical examination without abnormal findings: Secondary | ICD-10-CM

## 2022-10-02 DIAGNOSIS — B20 Human immunodeficiency virus [HIV] disease: Secondary | ICD-10-CM | POA: Diagnosis not present

## 2022-10-02 MED ORDER — GENVOYA 150-150-200-10 MG PO TABS: 1.0000 | ORAL_TABLET | Freq: Every day | ORAL | 6 refills | Status: DC

## 2022-10-02 MED ORDER — ZOSTER VAC RECOMB ADJUVANTED 50 MCG/0.5ML IM SUSR: 0.5000 mL | Freq: Once | INTRAMUSCULAR | 0 refills | Status: AC

## 2022-10-02 NOTE — Patient Instructions (Signed)
Nice to see you. ? ?We will check your lab work today. ? ?Continue to take your medication daily as prescribed. ? ?Refills have been sent to the pharmacy. ? ?Plan for follow up in 6 months or sooner if needed with lab work on the same day. ? ?Have a great day and stay safe! ? ?

## 2022-10-02 NOTE — Assessment & Plan Note (Signed)
Discussed importance of safe sexual practice and condom use. Condoms and STD testing offered.  Second dose of Shingrix sent to pharmacy for administration. Due for cervical cancer screening with breast cancer and colon cancer screening up to date.

## 2022-10-02 NOTE — Progress Notes (Signed)
Brief Narrative   Patient ID: Vanessa Browning, female    DOB: [birth year], 57 y.o.   MRN: [id number]  Ms. Lenna Hagarty is a 57 y/o AA female diagnosed with HIV disease around 30 with risk factor of heterosexual contact. Initial CD4 count and viral load unknown. Genotype fro 03/2013 with K103N resistance. No history of opportunistic infection. [id number] negative. Previous ART history with Kaletra, Isentress, Truvada and now Genvoya.   Subjective:    Chief Complaint  Patient presents with   Follow-up    B20 -    HPI:  Vanessa Browning is a 57 y.o. female with HIV disease last seen on 04/09/2022 with well-controlled virus and good adherence and tolerance to Genvoya.  Viral load was undetectable with CD4 count of 472.  Renal function, hepatic function, and electrolytes within normal ranges.  Here today for routine follow-up.  Vanessa Browning has been doing well since her last office visit and is working on cutting back on smoking.  Continues to take Genvoya as prescribed with no adverse side effects or problems obtaining medication from the pharmacy.  No new concerns/complaints.  Condoms and STD testing offered.  Healthcare maintenance due includes second dose of Shingrix.  Denies fevers, chills, night sweats, headaches, changes in vision, neck pain/stiffness, nausea, diarrhea, vomiting, lesions or rashes.  Allergies  Allergen Reactions   Dexilant [Dexlansoprazole]     Hands swelling       Outpatient Medications Prior to Visit  Medication Sig Dispense Refill   albuterol (VENTOLIN HFA) 108 (90 Base) MCG/ACT inhaler Inhale 1-2 puffs into the lungs every 6 (six) hours as needed for wheezing or shortness of breath. 18 g 0   cyclobenzaprine (FLEXERIL) 10 MG tablet Take 1 tablet (10 mg total) by mouth 2 (two) times daily as needed for muscle spasms. 10 tablet 0   gabapentin (NEURONTIN) 100 MG capsule Take 1 capsule (100 mg total) by mouth 3 (three) times daily. 90 capsule 1    lisinopril-hydrochlorothiazide (ZESTORETIC) 20-12.5 MG tablet Take 1 tablet by mouth daily. 30 tablet 11   olopatadine (PATANOL) 0.1 % ophthalmic solution Place 1 drop into both eyes 2 (two) times daily. 5 mL 0   trimethoprim-polymyxin b (POLYTRIM) ophthalmic solution Place 1 drop into the right eye every 4 (four) hours. Use as directed for 5-7 days. 10 mL 0   varenicline (CHANTIX) 1 MG tablet TAKE 1 TABLET BY MOUTH TWICE A DAY 180 tablet 1   venlafaxine XR (EFFEXOR-XR) 37.5 MG 24 hr capsule TAKE 1 CAPSULE BY MOUTH DAILY WITH BREAKFAST. 90 capsule 1   elvitegravir-cobicistat-emtricitabine-tenofovir (GENVOYA) 150-150-200-10 MG TABS tablet Take 1 tablet by mouth daily with breakfast. 30 tablet 6   rosuvastatin (CRESTOR) 5 MG tablet TAKE 1 TABLET (5 MG TOTAL) BY MOUTH DAILY. 90 tablet 1   No facility-administered medications prior to visit.     Past Medical History:  Diagnosis Date   Allergy    seasonal   GERD (gastroesophageal reflux disease)    HIV (human immunodeficiency virus infection)    Hyperlipidemia    Kidney stone    Substance abuse      Past Surgical History:  Procedure Laterality Date   APPENDECTOMY     APPENDECTOMY     CHOLECYSTECTOMY     INDUCED ABORTION     TONSILLECTOMY        Review of Systems  Constitutional:  Negative for appetite change, chills, diaphoresis, fatigue, fever and unexpected weight change.  Eyes:  Negative for acute change in vision  Respiratory:  Negative for chest tightness, shortness of breath and wheezing.   Cardiovascular:  Negative for chest pain.  Gastrointestinal:  Negative for diarrhea, nausea and vomiting.  Genitourinary:  Negative for dysuria, pelvic pain and vaginal discharge.  Musculoskeletal:  Negative for neck pain and neck stiffness.  Skin:  Negative for rash.  Neurological:  Negative for seizures, syncope, weakness and headaches.  Hematological:  Negative for adenopathy. Does not bruise/bleed easily.   Psychiatric/Behavioral:  Negative for hallucinations.       Objective:    BP 108/73   Pulse 74   Temp 97.9 F (36.6 C) (Oral)   Resp 16   Wt 185 lb (83.9 kg)   SpO2 98%   BMI 31.76 kg/m  Nursing note and vital signs reviewed.  Physical Exam Constitutional:      General: She is not in acute distress.    Appearance: She is well-developed.  Eyes:     Conjunctiva/sclera: Conjunctivae normal.  Cardiovascular:     Rate and Rhythm: Normal rate and regular rhythm.     Heart sounds: Normal heart sounds. No murmur heard.    No friction rub. No gallop.  Pulmonary:     Effort: Pulmonary effort is normal. No respiratory distress.     Breath sounds: Normal breath sounds. No wheezing or rales.  Chest:     Chest wall: No tenderness.  Abdominal:     General: Bowel sounds are normal.     Palpations: Abdomen is soft.     Tenderness: There is no abdominal tenderness.  Musculoskeletal:     Cervical back: Neck supple.  Lymphadenopathy:     Cervical: No cervical adenopathy.  Skin:    General: Skin is warm and dry.     Findings: No rash.  Neurological:     Mental Status: She is alert and oriented to person, place, and time.  Psychiatric:        Behavior: Behavior normal.        Thought Content: Thought content normal.        Judgment: Judgment normal.         10/02/2022   10:47 AM 04/09/2022   10:45 AM 02/28/2022   10:29 AM 12/18/2021    9:58 AM 08/21/2021    9:16 AM  Depression screen PHQ 2/9  Decreased Interest 0 0 0 0 0  Down, Depressed, Hopeless 0 0 0 0 0  PHQ - 2 Score 0 0 0 0 0  Altered sleeping     0  Tired, decreased energy     0  Change in appetite     0  Feeling bad or failure about yourself      0  Trouble concentrating     0  Moving slowly or fidgety/restless     0  Suicidal thoughts     0  PHQ-9 Score     0  Difficult doing work/chores     Not difficult at all       Assessment & Plan:    Patient Active Problem List   Diagnosis Date Noted   Breast  pain, left 08/07/2022   History of hyperglycemia 12/18/2021   Chronic low back pain without sciatica 07/24/2021   Generalized anxiety disorder 02/12/2020   Essential hypertension 02/12/2020   Healthcare maintenance 09/04/2018   LGSIL on Pap smear of cervix 11/06/2017   Obesity, Class I, BMI 30-34.9 01/20/2016   Hyperlipidemia 10/01/2012   Tobacco abuse 05/31/2011  Cervical herniated disc 12/09/2008   Mononeuropathy 08/19/2008   Human immunodeficiency virus (HIV) disease 01/13/2007   Allergic rhinitis 01/13/2007     Problem List Items Addressed This Visit       Other   Human immunodeficiency virus (HIV) disease - Primary (Chronic)    [name] continues to have well-controlled virus with good adherence and tolerance to Genvoya.  Reviewed previous lab work and discussed plan of care.  Continue current dose of Genvoya.  Plan for follow-up in 6 months or sooner if needed with lab work on the same day.       Relevant Medications   elvitegravir-cobicistat-emtricitabine-tenofovir (GENVOYA) 150-150-200-10 MG TABS tablet   Zoster Vaccine Adjuvanted [facility]) injection   Other Relevant Orders   COMPLETE METABOLIC PANEL WITH GFR   HIV-1 RNA quant-no reflex-bld   T-helper cell (CD4)- ([facility] clinic only)   Tobacco abuse (Chronic)    Vanessa Browning has been cutting back on tobacco use.  Discussed the importance of tobacco cessation to reduce risk of cardiovascular, respiratory, malignant disease in the future.  She is in the contemplation/action stage that she was previously prescribed Chantix.      Healthcare maintenance    Discussed importance of safe sexual practice and condom use. Condoms and STD testing offered.  Second dose of Shingrix sent to pharmacy for administration. Due for cervical cancer screening with breast cancer and colon cancer screening up to date.       Other Visit Diagnoses     Pharmacologic therapy       Relevant Orders   Lipid panel        I am having Arnetha Gula Figgs start on Zoster Vaccine Adjuvanted. I am also having her maintain her albuterol, rosuvastatin, lisinopril-hydrochlorothiazide, varenicline, cyclobenzaprine, gabapentin, venlafaxine XR, olopatadine, trimethoprim-polymyxin b, and Genvoya.   Meds ordered this encounter  Medications   elvitegravir-cobicistat-emtricitabine-tenofovir (GENVOYA) 150-150-200-10 MG TABS tablet    Sig: Take 1 tablet by mouth daily with breakfast.    Dispense:  30 tablet    Refill:  6    Order Specific Question:   Supervising Provider    Answer:   Drue Second, CYNTHIA [[id number]]   Zoster Vaccine Adjuvanted [facility]) injection    Sig: Inject 0.5 mLs into the muscle once for 1 dose.    Dispense:  0.5 mL    Refill:  0    This is second dose    Order Specific Question:   Supervising Provider    Answer:   Judyann Munson [[id number]]     Follow-up: Return in about 6 months (around 04/03/2023), or if symptoms worsen or fail to improve.   Marcos Eke, MSN, FNP-C Nurse Practitioner [facility] Main number: [phone]

## 2022-10-02 NOTE — Therapy (Signed)
OUTPATIENT PHYSICAL THERAPY TREATMENT NOTE   Patient Name: Vanessa Browning MRN: [id number] DOB:1966-03-04, 57 y.o., female Today's Date: 10/03/2022  PCP: Rocky Morel, DO    REFERRING PROVIDER: Reymundo Poll, MD neck, Vicente Masson MD low back  END OF SESSION:   PT End of Session - 10/03/22 1632     Visit Number 6    Number of Visits 13    Date for PT Re-Evaluation 10/11/22    Authorization Type [place] [employer]ICA[employer]    Authorization Time Period no auth    PT Start Time 1633    PT Stop Time 1713    PT Time Calculation (min) 40 min    Activity Tolerance Patient tolerated treatment well;No increased pain    Behavior During Therapy WFL for tasks assessed/performed                 Past Medical History:  Diagnosis Date   Allergy    seasonal   GERD (gastroesophageal reflux disease)    HIV (human immunodeficiency virus infection)    Hyperlipidemia    Kidney stone    Substance abuse    Past Surgical History:  Procedure Laterality Date   APPENDECTOMY     APPENDECTOMY     CHOLECYSTECTOMY     INDUCED ABORTION     TONSILLECTOMY     Patient Active Problem List   Diagnosis Date Noted   Breast pain, left 08/07/2022   History of hyperglycemia 12/18/2021   Chronic low back pain without sciatica 07/24/2021   Generalized anxiety disorder 02/12/2020   Essential hypertension 02/12/2020   Healthcare maintenance 09/04/2018   LGSIL on Pap smear of cervix 11/06/2017   Obesity, Class I, BMI 30-34.9 01/20/2016   Hyperlipidemia 10/01/2012   Tobacco abuse 05/31/2011   Cervical herniated disc 12/09/2008   Mononeuropathy 08/19/2008   Human immunodeficiency virus (HIV) disease 01/13/2007   Allergic rhinitis 01/13/2007    REFERRING DIAG:  M50.20 (ICD-10-CM) - Cervical herniated disc                                   M54.50,G89.29 (ICD-10-CM) - Chronic bilateral low back pain without sciatica   THERAPY DIAG:  Cervicalgia  Muscle weakness  (generalized)  Abnormal posture  Chronic bilateral low back pain without sciatica  Rationale for Evaluation and Treatment Rehabilitation  PERTINENT HISTORY: GERD, HIV, Hyperlipidemia, substance abuse hx, Kidney stones, Appy, cholecsystectomy, tonsilectomy   PRECAUTIONS: HIV  SUBJECTIVE:  Per eval - I have had back and neck pain for many years but it has gotten worse in the last 6 months when my numbness started in the last two fingers on RT side, and then it goes into my now both hands now. I also have pain in my low back with tingling in RT side LE painful and numbness going to mid shin. It is hard for me to lie on either side. I toss and turn every night. I wake up about every 3-4 hours at night. I can sit on a computer for 15 min but have to get up. I can stand washing dishes about 5-10 min. I do not do regular exercise. I try to chase my dog. I try to do the exercise for my back I did ,but my neck Is the main problem right now.   SUBJECTIVE STATEMENT:   Pt states her pain continues to fluctuate a fair bit, remains intense when present. Notes that exercises are helping her manage pain a bit better. No soreness after last session.   PAIN:  Are you having pain? 7/10 neck ,no back pain  Per eval - Yes: NPRS scale: at rest my neck is 5/10 and at worst is 10/10 and my back at rest is 4/10 and at worst my back is 8/10 Pain location: neck the most and also RT side back radiating Pain description: irritating aching , numbing into RT hand and in back it runs down throbbing burning pain Aggravating factors: sleeping, doing household chores for more than 5 to 10 min, sometimes just sitting and watching TV I try to carry groceries but it hurts Relieving factors: medication makes it better   OBJECTIVE: (objective  measures completed at initial evaluation unless otherwise dated)   DIAGNOSTIC FINDINGS:  COMPARISON:  Prior MRI from 08/22/2008.   FINDINGS: Alignment: Straightening of the normal cervical lordosis. No listhesis.   Vertebrae: Vertebral body height maintained without acute or chronic fracture. Bone marrow signal intensity within normal limits. No discrete or worrisome osseous lesions or abnormal marrow edema.   Cord: Normal signal and morphology.   Posterior Fossa, vertebral arteries, paraspinal tissues: Partially empty sella noted. Visualized brain and posterior fossa otherwise unremarkable. Craniocervical junction normal. Paraspinous soft tissues within normal limits. Normal flow voids seen within the vertebral arteries bilaterally.   Disc levels:   C2-C3: Unremarkable.   C3-C4: Mild disc bulge with uncovertebral spurring. No significant spinal stenosis. Foramina remain patent.   C4-C5: Mild disc bulge with right-sided uncovertebral spurring. Superimposed tiny central disc protrusion minimally indents the ventral thecal sac (series 5, image 25). No significant spinal stenosis. Borderline mild right C5 foraminal narrowing. Left neural foramen remains patent.   C5-C6: Shallow broad-based right paracentral disc protrusion with annular fissure indents the ventral thecal sac (series 6, image 18). Mild cord flattening without cord signal changes. No significant spinal stenosis. Foramina remain patent.   C6-C7: Broad-based left eccentric disc osteophyte complex mildly flattens the ventral thecal sac. Superimposed tiny left paracentral soft disc protrusion with inferior migration (series 5, image 38). No significant spinal stenosis or cord impingement. Foramina remain adequately patent.   C7-T1: Normal interspace. Mild right-sided facet hypertrophy. No canal or foraminal stenosis.   IMPRESSION: 1. Shallow right paracentral disc protrusion at C5-6 with resultant mild cord  flattening, but no cord signal changes or significant stenosis. 2. Left eccentric disc osteophyte complex at C6-7 with superimposed tiny left paracentral disc protrusion. The ventral left C7 nerve root could be affected. 3.  Mild disc bulge with right-sided uncovertebral spurring at C4-5 with resultant borderline mild right C5 foraminal stenosis. 4. Partially empty sella. While this finding is often incidental in nature and of no clinical significance, this can also be seen in the setting of idiopathic intracranial hypertension.   PATIENT SURVEYS:  NDI 20/50 40%   SCREENING FOR RED FLAGS: Bowel or bladder incontinence: No   COGNITION: Overall cognitive status: Within functional limits for tasks assessed                          SENSATION: Pt reports numbness in RT hand c -6/7 distribution but then complains of numbness in bil whole hand and down RT LE   MUSCLE LENGTH: Hamstrings: Right WNL deg; Left WNL deg Maisie Fus test: Right  WNL deg; Left WNL deg   POSTURE:  rounded shoulders, forward head, increased lumbar lordosis, anterior pelvic tilt, and CT step off  PALPATION: No TTP in lumbar, but marked TTP in RT C-5,6,7 neck CERVICAL ROM:    Active ROM A/PROM (deg) eval 09/21/22 AROM  Flexion 45   Extension 39   Right lateral flexion 20   Left lateral flexion 24   Right rotation 50 54 deg  Left rotation 62 65 deg   (Blank rows = not tested)      UPPER EXTREMITY ROM:  WNL in BIl UE   Active ROM Right eval Left eval  Shoulder flexion      Shoulder extension      Shoulder abduction      Shoulder adduction      Shoulder extension      Shoulder internal rotation      Shoulder external rotation      Elbow flexion      Elbow extension      Wrist flexion      Wrist extension      Wrist ulnar deviation      Wrist radial deviation      Wrist pronation      Wrist supination       (Blank rows = not tested)   UPPER EXTREMITY MMT:   MMT Right eval Left eval  Shoulder  flexion 4p 4  Shoulder extension      Shoulder abduction 4p 4  Shoulder adduction      Shoulder extension      Shoulder internal rotation      Shoulder external rotation      Middle trapezius      Lower trapezius      Elbow flexion      Elbow extension      Wrist flexion      Wrist extension      Wrist ulnar deviation      Wrist radial deviation      Wrist pronation      Wrist supination      Grip strength 68lb 64lb   (Blank rows = not tested)   CERVICAL SPECIAL TESTS:  Neck flexor muscle endurance test: Positive, Spurling's test: Positive, and Distraction test: Negative   LUMBAR ROM:    AROM eval  Flexion Fingertips to bil shins  Extension 10 increase RT LE pain  Right lateral flexion  WNL  Left lateral flexion WNL  Right rotation WNL  Left rotation WNL   (Blank rows = not tested)   LOWER EXTREMITY ROM:    WNL   Active  Right eval Left eval  Hip flexion  Hip extension      Hip abduction      Hip adduction      Hip internal rotation      Hip external rotation      Knee flexion      Knee extension      Ankle dorsiflexion      Ankle plantarflexion      Ankle inversion      Ankle eversion       (Blank rows = not tested)   LOWER EXTREMITY MMT:   weak abdominals  grossly  4- 4+/5   MMT Right eval Left eval  Hip flexion 4+ 4+  Hip extension      Hip abduction 4 4  Hip adduction      Hip internal rotation      Hip external rotation      Knee flexion      Knee extension      Ankle dorsiflexion      Ankle plantarflexion      Ankle inversion      Ankle eversion       (Blank rows = not tested)   LUMBAR SPECIAL TESTS:  Straight leg raise test: Negative and Slump test: Negative   FUNCTIONAL TESTS:  5 times sit to stand: 27.56 sec 6 minute walk test: TBD   GAIT: Distance walked: 150 feet Assistive device utilized: None Level of assistance: Complete Independence Comments: WFL's   TODAY'S TREATMENT:                                                                                                                               [facility] Adult PT Treatment:                                                DATE: 10/03/22 Therapeutic Exercise: RTB horizontal abduction supine 2x8 cues for form and comfortable ROM  Supine BIL GH flexion w/ 2# DB 2x8 cues for control Seated GH ER + scaption 2x12 RTB cues for appropriate ROM and reduced lumbar compensations  RTB wrist scaption 2x10 cues for neutral posture and appropriate ROM  Serratus punch, standing at CC, 7# 2x10 BIL UE cues for pacing and posture    [facility] Adult PT Treatment:                                                DATE: 09/28/22 Therapeutic Exercise: RTB horizontal abd, supine, 2x8 cues for form and appropriate alignment  Supine GH flexion 2x10 cues for pacing Seated RTB B ER + scapular retraction 2x8 cues for posture and reduced UT activation Swiss ball GH flexion up wall emphasis on T spine extension x8 cues  for form, neck positioning Seated swiss ball press down 2x10 cues for form and breath control, posture Supine serratus punch 2# 2x8 BIL cues for reduced elbow compensations and improved velocity  Supine chin tucks 2x15  HEP review, provided w/ RTB in anticipation of prescribing banded exercise for HEP in next couple visits   [facility] Adult PT Treatment:                                                DATE: 09/21/22 Therapeutic Exercise: Supine shoulder flexion AROM x8 cues for appropriate ROM  Supine YTB horizontal abduction 2x8 cues for appropriate alignment and pacing Supine chin tuck 2x12 cues for form and reduced time holding Seated YTB double ER + scap retraction 2x15 cues for form and posture  Scapular retraction 3x10 cues for reduced UT compensations  LS stretch 3x30sec BIL cues for breath control UT stretch 3x30sec BIL cues for pacing HEP update + handout/education      PATIENT EDUCATION:  Education details: rationale for interventions, HEP, relevant anatomy/physiology   Person educated: Patient Education method: Explanation, Demonstration, Tactile cues, Verbal cues, and Handouts Education comprehension: verbalized understanding, returned demonstration, verbal cues required, tactile cues required, and needs further education   HOME EXERCISE PROGRAM: Access Code: [email or web address] URL: [email or web address] Date: 09/21/2022 Prepared by: Fransisco Hertz  Exercises - Supine Deep Neck Flexor Training  - 2-3 x daily - 7 x weekly - 10 reps - 3 hold - Seated Gentle Upper Trapezius Stretch  - 1 x daily - 7 x weekly - 1 sets - 3 reps - 30 hold - Gentle Levator Scapulae Stretch  - 1 x daily - 7 x weekly - 3 sets - 3 reps - 30 hold - Supine Shoulder Flexion Extension Full Range AROM  - 1 x daily - 7 x weekly - 2 sets - 8 reps   ASSESSMENT:   CLINICAL IMPRESSION: 10/03/2022 Pt arrives w/ report of increased pain, 7/10, although she states she felt good after last session and exercises are helping manage her more severe pain. Despite increased symptoms on arrival, pt responds well to continued progression with periscapular/cervical resisted exercise with report of improved pain as session goes on. Able to tolerate increased resistance/repetition as appropriate. No adverse events, pt reports 2/10 pain on departure. Pt departs today's session in no acute distress, all voiced questions/concerns addressed appropriately from PT perspective.    Per eval - Patient is a 57 y.o. female previously seen for back pain  who was seen today for physical therapy evaluation and treatment for cervical radiculopathy and ongoing neck and spine issues. Pt symptoms compatible with cervical radiculopathy and will benefit from skilled PT to address issues with sleeping and being able to perform household duties at home with decreased pain in neck.    OBJECTIVE IMPAIRMENTS: decreased ROM, decreased strength, impaired sensation, impaired UE functional use, improper body mechanics, postural  dysfunction, obesity, and pain.    ACTIVITY LIMITATIONS: carrying, lifting, bending, sitting, standing, sleeping, and reach over head   PARTICIPATION LIMITATIONS: cleaning, laundry, and driving   PERSONAL FACTORS: GERD, HIV, Hyperlipidemia, substance abuse hx, Kidney stones, Appy, cholecsystectomy, tonsilectomy are also affecting patient's functional outcome.    REHAB POTENTIAL: Good   CLINICAL DECISION MAKING: Evolving/moderate complexity   EVALUATION COMPLEXITY: Moderate     GOALS: Goals reviewed with patient? Yes   SHORT TERM GOALS:  Target date: 08-18-22   Pt will be independent with Initial HEP Baseline: Goal status: INITIAL   2.  Pt will be able to reduce pain with movement  and worst pain from 10/10 to at least 6/10 Baseline: eval 10/10 at worst Goal status: INITIAL   3.  Pt will be able to demonstrate supine neck flexion test for at least 35 sec  to show improved strength Baseline: eval 18 sec  Goal status: INITIAL   4.Improved cervical rotation to allow checking blind spots while driving with minimal discomfort Baseline: See AROM flow chart Goal status: INITIAL       LONG TERM GOALS: Target date: 10-11-22   Pt  will be independent with advanced HEP Baseline:  Goal status: INITIAL   2.  Pt NDI will improve at least 10 point from 20/50 to 10/50 Baseline: 20/50 40% Goal status: INITIAL   3.  Pt will be able to utilize sleep positions to maximize sleep for 4 or more hours of restorative consecutive sleep Baseline:  Pt wakes every 1-2 hours a night and cannot sleep on sides due to pain Goal status: INITIAL   4.  Pt will be able to increase Cervical AROM to within 5 degrees of RT/LT Baseline: See flow chart AROM cervical Goal status: INITIAL   5.  Pt will be able to pick up 15 pounds/simulating groceries from the floor with minimal Neck pain Baseline: Pt avoids lifting anything do to pain Goal status: INITIAL   6.  Pt will be able to lift arms overhead to do  household chores without out exacerbating neck pain Baseline:  Goal status: INITIAL   PLAN:   PT FREQUENCY: 1-2x/week   PT DURATION: 6 weeks   PLANNED INTERVENTIONS: Therapeutic exercises, Therapeutic activity, Neuromuscular re-education, Balance training, Gait training, Patient/Family education, Self Care, Joint mobilization, Dry Needling, Electrical stimulation, Spinal mobilization, Cryotherapy, Moist heat, Taping, Traction, Manual therapy, and Re-evaluation.   PLAN FOR NEXT SESSION: after discussion w/ pt plan for tentative d/c next visit      Ashley Murrain PT, DPT 10/03/2022 5:41 PM

## 2022-10-02 NOTE — Assessment & Plan Note (Addendum)
[name] has been cutting back on tobacco use.  Discussed the importance of tobacco cessation to reduce risk of cardiovascular, respiratory, malignant disease in the future.  She is in the contemplation/action stage that she was previously prescribed Chantix.

## 2022-10-02 NOTE — Assessment & Plan Note (Signed)
[name] continues to have well-controlled virus with good adherence and tolerance to Genvoya.  Reviewed previous lab work and discussed plan of care.  Continue current dose of Genvoya.  Plan for follow-up in 6 months or sooner if needed with lab work on the same day.

## 2022-10-03 ENCOUNTER — Encounter: Payer: Self-pay | Admitting: Physical Therapy

## 2022-10-03 ENCOUNTER — Ambulatory Visit: Payer: Medicaid Other | Admitting: Physical Therapy

## 2022-10-03 DIAGNOSIS — M6281 Muscle weakness (generalized): Secondary | ICD-10-CM

## 2022-10-03 DIAGNOSIS — M542 Cervicalgia: Secondary | ICD-10-CM

## 2022-10-03 DIAGNOSIS — G8929 Other chronic pain: Secondary | ICD-10-CM

## 2022-10-03 DIAGNOSIS — R293 Abnormal posture: Secondary | ICD-10-CM

## 2022-10-03 LAB — T-HELPER CELL (CD4) - (RCID CLINIC ONLY)
CD4 % Helper T Cell: 25 % — ABNORMAL LOW (ref 33–65)
CD4 T Cell Abs: 521 /uL (ref 400–1790)

## 2022-10-04 LAB — COMPLETE METABOLIC PANEL WITH GFR
AG Ratio: 1.4 (calc) (ref 1.0–2.5)
ALT: 14 U/L (ref 6–29)
AST: 15 U/L (ref 10–35)
Albumin: 4.2 g/dL (ref 3.6–5.1)
Alkaline phosphatase (APISO): 89 U/L (ref 37–153)
BUN: 10 mg/dL (ref 7–25)
CO2: 29 mmol/L (ref 20–32)
Calcium: 9.4 mg/dL (ref 8.6–10.4)
Chloride: 105 mmol/L (ref 98–110)
Creat: 0.86 mg/dL (ref 0.50–1.03)
Globulin: 3.1 g/dL (calc) (ref 1.9–3.7)
Glucose, Bld: 80 mg/dL (ref 65–99)
Potassium: 3.7 mmol/L (ref 3.5–5.3)
Sodium: 141 mmol/L (ref 135–146)
Total Bilirubin: 0.3 mg/dL (ref 0.2–1.2)
Total Protein: 7.3 g/dL (ref 6.1–8.1)
eGFR: 78 mL/min/{1.73_m2} (ref >=60)

## 2022-10-04 LAB — LIPID PANEL
Cholesterol: 247 mg/dL — ABNORMAL HIGH (ref <200)
HDL: 43 mg/dL — ABNORMAL LOW (ref >=50)
LDL Cholesterol (Calc): 154 mg/dL (calc) — ABNORMAL HIGH
Non-HDL Cholesterol (Calc): 204 mg/dL (calc) — ABNORMAL HIGH (ref <130)
Total CHOL/HDL Ratio: 5.7 (calc) — ABNORMAL HIGH (ref <5.0)
Triglycerides: 323 mg/dL — ABNORMAL HIGH (ref <150)

## 2022-10-04 LAB — HIV-1 RNA QUANT-NO REFLEX-BLD
HIV 1 RNA Quant: <20 Copies/mL — ABNORMAL HIGH
HIV-1 RNA Quant, Log: <1.3 Log cps/mL — ABNORMAL HIGH

## 2022-10-05 ENCOUNTER — Encounter: Payer: Self-pay | Admitting: Physical Therapy

## 2022-10-05 ENCOUNTER — Ambulatory Visit: Payer: Medicaid Other | Admitting: Physical Therapy

## 2022-10-05 DIAGNOSIS — R293 Abnormal posture: Secondary | ICD-10-CM

## 2022-10-05 DIAGNOSIS — M542 Cervicalgia: Secondary | ICD-10-CM

## 2022-10-05 DIAGNOSIS — M6281 Muscle weakness (generalized): Secondary | ICD-10-CM

## 2022-10-05 DIAGNOSIS — G8929 Other chronic pain: Secondary | ICD-10-CM

## 2022-10-05 NOTE — Therapy (Addendum)
OUTPATIENT PHYSICAL THERAPY TREATMENT NOTE + RECERTIFICATION + NO VISIT DISCHARGE SUMMARY (see below)    Patient Name: Vanessa Browning MRN: [id number] DOB:[birth year], 57 y.o., female Today's Date: 10/05/2022  PCP: Rocky Morel, DO    REFERRING PROVIDER: Reymundo Poll, MD neck, Vicente Masson MD low back    END OF SESSION:   PT End of Session - 10/05/22 1403     Visit Number 7    Number of Visits 13    Date for PT Re-Evaluation 10/26/22    Authorization Type [place] [employer]ICA[employer]    Authorization Time Period no auth    PT Start Time 1404    PT Stop Time 1456    PT Time Calculation (min) 52 min    Activity Tolerance Patient tolerated treatment well;No increased pain    Behavior During Therapy WFL for tasks assessed/performed               Past Medical History:  Diagnosis Date   Allergy    seasonal   GERD (gastroesophageal reflux disease)    HIV (human immunodeficiency virus infection) (HCC)    Hyperlipidemia    Kidney stone    Substance abuse (HCC)    Past Surgical History:  Procedure Laterality Date   APPENDECTOMY     APPENDECTOMY     CHOLECYSTECTOMY     INDUCED ABORTION     TONSILLECTOMY     Patient Active Problem List   Diagnosis Date Noted   Breast pain, left 08/07/2022   History of hyperglycemia 12/18/2021   Chronic low back pain without sciatica 07/24/2021   Generalized anxiety disorder 02/12/2020   Essential hypertension 02/12/2020   Healthcare maintenance 09/04/2018   LGSIL on Pap smear of cervix 11/06/2017   Obesity, Class I, BMI 30-34.9 01/20/2016   Hyperlipidemia 10/01/2012   Tobacco abuse 05/31/2011   Cervical herniated disc 12/09/2008   Mononeuropathy 08/19/2008   Human immunodeficiency virus (HIV) disease (HCC) 01/13/2007   Allergic rhinitis 01/13/2007    REFERRING DIAG:  M50.20 (ICD-10-CM) - Cervical herniated disc                                   M54.50,G89.29 (ICD-10-CM) - Chronic bilateral low back  pain without sciatica   THERAPY DIAG:  Cervicalgia  Muscle weakness (generalized)  Abnormal posture  Chronic bilateral low back pain without sciatica  Rationale for Evaluation and Treatment Rehabilitation  PERTINENT HISTORY: GERD, HIV, Hyperlipidemia, substance abuse hx, Kidney stones, Appy, cholecsystectomy, tonsilectomy   PRECAUTIONS: HIV  SUBJECTIVE:  Per eval - I have had back and neck pain for many years but it has gotten worse in the last 6 months when my numbness started in the last two fingers on RT side, and then it goes into my now both hands now. I also have pain in my low back with tingling in RT side LE painful and numbness going to mid shin. It is hard for me to lie on either side. I toss and turn every night. I wake up about every 3-4 hours at night. I can sit on a computer for 15 min but have to get up. I can stand washing dishes about 5-10 min. I do not do regular exercise. I try to chase my dog. I try to do the exercise for my back I did ,but my neck Is the main problem right now.   SUBJECTIVE STATEMENT:   Pt arrives w/ 5/10 neck and 4/10 back pain. States pain continues to fluctuate but exercises tend to improve significantly for a short period of time. States she was able to schedule an appt with her doctor Monday and is looking forward to following up.     PAIN:  Are you having pain? 5/10 neck , 4/10  back pain  Per eval - Yes: NPRS scale: at rest my neck is 5/10 and at worst is 10/10 and my back at rest is 4/10 and at worst my back is 8/10 Pain location: neck the most and also RT side back radiating Pain description: irritating aching , numbing into RT hand and in back it runs down throbbing burning pain Aggravating factors: sleeping, doing household chores for more than 5 to 10 min,  sometimes just sitting and watching TV I try to carry groceries but it hurts Relieving factors: medication makes it better   OBJECTIVE: (objective measures completed at initial evaluation unless otherwise dated)   DIAGNOSTIC FINDINGS:  COMPARISON:  Prior MRI from 08/22/2008.   FINDINGS: Alignment: Straightening of the normal cervical lordosis. No listhesis.   Vertebrae: Vertebral body height maintained without acute or chronic fracture. Bone marrow signal intensity within normal limits. No discrete or worrisome osseous lesions or abnormal marrow edema.   Cord: Normal signal and morphology.   Posterior Fossa, vertebral arteries, paraspinal tissues: Partially empty sella noted. Visualized brain and posterior fossa otherwise unremarkable. Craniocervical junction normal. Paraspinous soft tissues within normal limits. Normal flow voids seen within the vertebral arteries bilaterally.   Disc levels:   C2-C3: Unremarkable.   C3-C4: Mild disc bulge with uncovertebral spurring. No significant spinal stenosis. Foramina remain patent.   C4-C5: Mild disc bulge with right-sided uncovertebral spurring. Superimposed tiny central disc protrusion minimally indents the ventral thecal sac (series 5, image 25). No significant spinal stenosis. Borderline mild right C5 foraminal narrowing. Left neural foramen remains patent.   C5-C6: Shallow broad-based right paracentral disc protrusion with annular fissure indents the ventral thecal sac (series 6, image 18). Mild cord flattening without cord signal changes. No significant spinal stenosis. Foramina remain patent.   C6-C7: Broad-based left eccentric disc osteophyte complex mildly flattens the ventral thecal sac. Superimposed tiny left paracentral soft disc protrusion with inferior migration (series 5, image 38). No significant spinal stenosis or cord impingement. Foramina remain adequately patent.   C7-T1: Normal interspace. Mild  right-sided facet hypertrophy. No canal or foraminal stenosis.   IMPRESSION: 1. Shallow right paracentral disc protrusion at C5-6 with resultant mild cord flattening, but no cord signal changes or significant stenosis. 2. Left eccentric disc osteophyte complex  at C6-7 with superimposed tiny left paracentral disc protrusion. The ventral left C7 nerve root could be affected. 3. Mild disc bulge with right-sided uncovertebral spurring at C4-5 with resultant borderline mild right C5 foraminal stenosis. 4. Partially empty sella. While this finding is often incidental in nature and of no clinical significance, this can also be seen in the setting of idiopathic intracranial hypertension.   PATIENT SURVEYS:  NDI 20/50 40% NDI 10/05/22 26/45 (did not answer question 5), 58%    SCREENING FOR RED FLAGS: Bowel or bladder incontinence: No   COGNITION: Overall cognitive status: Within functional limits for tasks assessed                          SENSATION: Pt reports numbness in RT hand c -6/7 distribution but then complains of numbness in bil whole hand and down RT LE   MUSCLE LENGTH: Hamstrings: Right WNL deg; Left WNL deg Maisie Fus test: Right  WNL deg; Left WNL deg   POSTURE:  rounded shoulders, forward head, increased lumbar lordosis, anterior pelvic tilt, and CT step off  PALPATION: No TTP in lumbar, but marked TTP in RT C-5,6,7 neck CERVICAL ROM:    Active ROM A/PROM (deg) eval 09/21/22 AROM 10/05/22 AROM    Flexion 45    Extension 39    Right lateral flexion 20    Left lateral flexion 24    Right rotation 50 54 deg 65 deg  Left rotation 62 65 deg 62 deg s    (Blank rows = not tested)      UPPER EXTREMITY ROM:  WNL in BIl UE   Active ROM Right eval Left eval  Shoulder flexion      Shoulder extension      Shoulder abduction      Shoulder adduction      Shoulder extension      Shoulder internal rotation      Shoulder external rotation      Elbow flexion      Elbow  extension      Wrist flexion      Wrist extension      Wrist ulnar deviation      Wrist radial deviation      Wrist pronation      Wrist supination       (Blank rows = not tested)   UPPER EXTREMITY MMT:   MMT Right eval Left eval  Shoulder flexion 4p 4  Shoulder extension      Shoulder abduction 4p 4  Shoulder adduction      Shoulder extension      Shoulder internal rotation      Shoulder external rotation      Middle trapezius      Lower trapezius      Elbow flexion      Elbow extension      Wrist flexion      Wrist extension      Wrist ulnar deviation      Wrist radial deviation      Wrist pronation      Wrist supination      Grip strength 68lb 64lb   (Blank rows = not tested)   CERVICAL SPECIAL TESTS:  Neck flexor muscle endurance test: Positive, Spurling's test: Positive, and Distraction test: Negative   LUMBAR ROM:    AROM eval  Flexion Fingertips to bil shins  Extension 10 increase RT LE pain  Right lateral flexion  WNL  Left  lateral flexion WNL  Right rotation WNL  Left rotation WNL   (Blank rows = not tested)   LOWER EXTREMITY ROM:    WNL   Active  Right eval Left eval  Hip flexion      Hip extension      Hip abduction      Hip adduction      Hip internal rotation      Hip external rotation      Knee flexion      Knee extension      Ankle dorsiflexion      Ankle plantarflexion      Ankle inversion      Ankle eversion       (Blank rows = not tested)   LOWER EXTREMITY MMT:   weak abdominals  grossly  4- 4+/5   MMT Right eval Left eval  Hip flexion 4+ 4+  Hip extension      Hip abduction 4 4  Hip adduction      Hip internal rotation      Hip external rotation      Knee flexion      Knee extension      Ankle dorsiflexion      Ankle plantarflexion      Ankle inversion      Ankle eversion       (Blank rows = not tested)   LUMBAR SPECIAL TESTS:  Straight leg raise test: Negative and Slump test: Negative   FUNCTIONAL TESTS:  5  times sit to stand: 27.56 sec 6 minute walk test: TBD   10/05/22:    - supine neck flexor endurance test 64 sec    GAIT: Distance walked: 150 feet Assistive device utilized: None Level of assistance: Complete Independence Comments: WFL's   TODAY'S TREATMENT:                                                                                                                              [facility] Adult PT Treatment:                                                DATE: 10/05/22 Therapeutic Exercise: Supine 3# GH flexion 2x5 Seated RTB horizontal abd 2x8 cues for reduced lumbar compensations HEP discussion/education   Therapeutic Activity: MSK assessment + education Lifting assessment + education 5# unilat lift from floor x8 BIL cues for improved LE mechanics and posture 10# unilat lift from floor x5 BIL cues for form as above NDI + education Education re: progress with PT thus far, pt goals, activity modification based on symptoms, symptom modification strategies. Significant time with discussion/education re: relevant anatomy/physiology with use of spine model, pain neuroscience education     PATIENT EDUCATION:  Education details: rationale for interventions, HEP, relevant anatomy/physiology, see above Person educated: Patient Education method:  Explanation, Demonstration, Tactile cues, Verbal cues, and Handouts Education comprehension: verbalized understanding, returned demonstration, verbal cues required, tactile cues required, and needs further education   HOME EXERCISE PROGRAM: Access Code: [email or web address] URL: [email or web address] Date: 09/21/2022 Prepared by: Fransisco Hertz  Exercises - Supine Deep Neck Flexor Training  - 2-3 x daily - 7 x weekly - 10 reps - 3 hold - Seated Gentle Upper Trapezius Stretch  - 1 x daily - 7 x weekly - 1 sets - 3 reps - 30 hold - Gentle Levator Scapulae Stretch  - 1 x daily - 7 x weekly - 3 sets - 3 reps - 30 hold - Supine Shoulder Flexion  Extension Full Range AROM  - 1 x daily - 7 x weekly - 2 sets - 8 reps   ASSESSMENT:   CLINICAL IMPRESSION: 10/05/2022 Pt arrives w/ 5/10 neck pain and 4/10 back pain. Significant time spent with discussion re: pt progress and goals, relevant anatomy/physiology as it pertains to exercise/activity and pain, see above. Reports 2/10 neck pain and no change in back pain after assessment, benefits from cues for lifting mechanics. Reports 0/10 pain after therex program with progression as above. Pt continues to endorse fairly significant pain levels overall but states that exercises tend to give her significant transient relief. Activity tolerance within sessions significantly improved compared to start of care. Recommend continuing along POC in order to further progress postural strength/endurance with aim to build increased tissue tolerance for daily activities. No adverse events, pt verbalizes agreement with plan, states she is following up with her physician Monday. Pt departs today's session in no acute distress, all voiced questions/concerns addressed appropriately from PT perspective.       Per eval - Patient is a 57 y.o. female previously seen for back pain  who was seen today for physical therapy evaluation and treatment for cervical radiculopathy and ongoing neck and spine issues. Pt symptoms compatible with cervical radiculopathy and will benefit from skilled PT to address issues with sleeping and being able to perform household duties at home with decreased pain in neck.    OBJECTIVE IMPAIRMENTS: decreased ROM, decreased strength, impaired sensation, impaired UE functional use, improper body mechanics, postural dysfunction, obesity, and pain.    ACTIVITY LIMITATIONS: carrying, lifting, bending, sitting, standing, sleeping, and reach over head   PARTICIPATION LIMITATIONS: cleaning, laundry, and driving   PERSONAL FACTORS: GERD, HIV, Hyperlipidemia, substance abuse hx, Kidney stones, Appy,  cholecsystectomy, tonsilectomy are also affecting patient's functional outcome.    REHAB POTENTIAL: Good   CLINICAL DECISION MAKING: Evolving/moderate complexity   EVALUATION COMPLEXITY: Moderate     GOALS: Goals reviewed with patient? Yes   SHORT TERM GOALS: Target date: 09-18-22   Pt will be independent with Initial HEP Baseline: 10/05/22: pt endorses good adherence w/ HEP Goal status: MET   2.  Pt will be able to reduce pain with movement  and worst pain from 10/10 to at least 6/10 Baseline: eval 10/10 at worst 10/05/22: 10/10 at worst, 0/10  Goal status: PROGRESSING    3.  Pt will be able to demonstrate supine neck flexion test for at least 35 sec  to show improved strength Baseline: eval 18 sec  10/05/22: 64 sec no issues  Goal status: MET   4.Improved cervical rotation to allow checking blind spots while driving with minimal discomfort Baseline: See AROM flow chart 10/05/22: see AROM chart  Goal status: MET       LONG TERM GOALS: Target date: 10/26/2022 (extended 10/05/22 to  utilize remaining visits in POC) Pt  will be independent with advanced HEP Baseline:  10/05/22: doing well with HEP  per pt report Goal status: MET   2.  Pt NDI will improve at least 10 point from 20/50 to 10/50 Baseline: 20/50 40% 10/05/22: 58% Goal status: ONGOING   3.  Pt will be able to utilize sleep positions to maximize sleep for 4 or more hours of restorative consecutive sleep Baseline:  Pt wakes every 1-2 hours a night and cannot sleep on sides due to pain 10/05/22: pt states she gets about 4 hours of consecutive sleep with medication Goal status: PROGRESSING   4.  Pt will be able to increase Cervical AROM to within 5 degrees of RT/LT Baseline: See flow chart AROM cervical 10/05/22: 3 deg difference  Goal status: MET   5.  Pt will be able to pick up 15 pounds/simulating groceries from the floor with minimal Neck pain Baseline: Pt avoids lifting anything do to pain 10/05/22: 10# lift  from floor  Goal status: PROGRESSING   6.  Pt will be able to lift arms overhead to do household chores without out exacerbating neck pain Baseline:  10/05/22: overhead reach nonpainful  Goal status: MET   PLAN: (updated 10/05/22 to utilize remaining visits in initial POC)   PT FREQUENCY: 2x/week    PT DURATION: 3 weeks     PLANNED INTERVENTIONS: Therapeutic exercises, Therapeutic activity, Neuromuscular re-education, Balance training, Gait training, Patient/Family education, Self Care, Joint mobilization, Dry Needling, Electrical stimulation, Spinal mobilization, Cryotherapy, Moist heat, Taping, Traction, Manual therapy, and Re-evaluation.   PLAN FOR NEXT SESSION: continue along current POC, gradually progress strengthening of periscapular, cervical, and core musculature      Ashley Murrain PT, DPT 10/05/2022 3:16 PM    Discharge addendum:     PHYSICAL THERAPY DISCHARGE SUMMARY  Visits from Start of Care: 7  Current functional level related to goals / functional outcomes: Unable to assess   Remaining deficits: Unable to assess   Education / Equipment: Unable to assess  Patient unable to agree to discharge due to lack of follow up.  Patient goals were  unable to be assessed . Patient is being discharged due to not returning since the last visit.    Ashley Murrain PT, DPT 12/25/2022 3:12 PM

## 2022-10-08 ENCOUNTER — Encounter: Payer: Self-pay | Admitting: Student

## 2022-10-08 ENCOUNTER — Ambulatory Visit (INDEPENDENT_AMBULATORY_CARE_PROVIDER_SITE_OTHER): Payer: Medicaid Other | Admitting: Student

## 2022-10-08 VITALS — BP 131/71 | HR 92 | Temp 97.7°F | Wt 186.2 lb

## 2022-10-08 DIAGNOSIS — M5020 Other cervical disc displacement, unspecified cervical region: Secondary | ICD-10-CM | POA: Diagnosis present

## 2022-10-08 DIAGNOSIS — M5412 Radiculopathy, cervical region: Secondary | ICD-10-CM

## 2022-10-08 DIAGNOSIS — G8929 Other chronic pain: Secondary | ICD-10-CM | POA: Diagnosis not present

## 2022-10-08 MED ORDER — GABAPENTIN 300 MG PO CAPS: 300.0000 mg | ORAL_CAPSULE | Freq: Three times a day (TID) | ORAL | 2 refills | Status: DC

## 2022-10-08 MED ORDER — METHOCARBAMOL 500 MG PO TABS: 500.0000 mg | ORAL_TABLET | Freq: Three times a day (TID) | ORAL | 0 refills | Status: DC | PRN

## 2022-10-08 NOTE — Progress Notes (Unsigned)
   CC: Neck pain follow-up  HPI:  [name] is a 57 y.o. female with PMH as below who presents to clinic to follow-up on her neck pain. Please see problem based charting for evaluation, assessment and plan.  Past Medical History:  Diagnosis Date   Allergy    seasonal   GERD (gastroesophageal reflux disease)    HIV (human immunodeficiency virus infection) (HCC)    Hyperlipidemia    Kidney stone    Substance abuse (HCC)     Review of Systems:  Constitutional: Positive for insomnia due to pain Eyes: Negative for visual changes Respiratory: Negative for shortness of breath MSK: Positive for chronic neck and back pain Neuro: Positive for numbness and tingling down right arm. Negative for headache or weakness  Physical Exam: General: Pleasant, well-appearing middle-aged woman. No acute distress. Neck: Mild tenderness to palpation of the C-spine and paraspinal muscles. Normal ROM.  Negative Spurling test. Cardiac: RRR. No murmurs, rubs or gallops. No LE edema Respiratory: Lungs CTAB. No wheezing or crackles. Abdominal: Soft, symmetric and non tender. Normal BS. Skin: Warm, dry and intact without rashes or lesions Extremities: Atraumatic. Full ROM. Palpable radial and DP pulses. Neuro: A&O x 3. Moves all extremities. Decreased sensation to the RUE. Strength 5/5 in all extremities. Psych: Appropriate mood and affect.  Vitals:   10/08/22 0910  BP: 131/71  Pulse: 92  Temp: 97.7 F (36.5 C)  TempSrc: Oral  SpO2: 100%  Weight: 186 lb 3.2 oz (84.5 kg)     Assessment & Plan:   Cervical herniated disc Patient presented for follow-up on her chronic neck pain. States she has been getting physical therapy since January however her pain improves initially with therapy but comes back right away. Over the last few weeks, she has had trouble sleeping due to the pain. She describes neck pain as well as shooting pain down her right arm with associated numbness and tingling of  her fingers. The pain has been intermittent and severe at times, limiting her ability to perform her ADLs effectively.  She has been taking the prescribed gabapentin and as needed Tylenol without significant relief.  She also wants to know the results of her MRI so I informed her that she does have cervical disc protrusion in her neck which is likely the cause of her radicular symptoms. On exam, she does have tenderness to palpation of the C-spine and paraspinals muscles but Spurling test was negative.  She also reports decreased sensation to her right upper extremity, especially to her fingers.  I will refer her to neurosurgery, increase her gabapentin and provide a short course of muscle relaxer.  She tells me that she just finished PT so she will need another referral to continue while waiting for neurosurgery appointment.  Plan: -Referral to neurosurgery -Referral to physical therapy -Increase gabapentin to 300 mg 3 times daily -Start Robaxin 500 mg every 8 hours as needed for muscle spasm, #30 tabs -Continue home stretches and as needed Tylenol and NSAIDs -Follow-up as needed   See Encounters Tab for problem based charting.  Patient discussed with Dr. Lewie Chamber, MD, MPH

## 2022-10-08 NOTE — Patient Instructions (Signed)
Thank you, Ms.Ayah Cozzolino Figgs for allowing [place] to provide your care today. Today we discussed your neck pain and MRI results.  The MRI of your neck showed that you have cervical protrusion which is likely causing the pain down your right arm.  I have place a referrals to neurosurgery.I have also sent a referral to physical therapy for you to continue therapy.  I have ordered the following medication/changed the following medications:  Increase gabapentin to 300 mg 3 times daily Start Robaxin 500 mg every 8 hours as needed for muscle spasm Continue taking over-the-counter ibuprofen and Tylenol as needed for pain  My Chart Access: [email or web address]  Please follow-up in 3 months or as needed  Please make sure to arrive 15 minutes prior to your next appointment. If you arrive late, you may be asked to reschedule.    We look forward to seeing you next time. Please call our clinic at [phone] if you have any questions or concerns. The best time to call is Monday-Friday from 9am-4pm, but there is someone available 24/7. If after hours or the weekend, call the main hospital number and ask for the Internal Medicine Resident On-Call. If you need medication refills, please notify your pharmacy one week in advance and they will send [place] a request.   Thank you for letting us take part in your care. Wishing you the best!  Steffanie Rainwater, MD 10/08/2022, 9:45 AM IM Resident, PGY-3 [place] 41:10

## 2022-10-09 ENCOUNTER — Encounter: Payer: Self-pay | Admitting: Student

## 2022-10-09 NOTE — Assessment & Plan Note (Signed)
Patient presented for follow-up on her chronic neck pain. States she has been getting physical therapy since January however her pain improves initially with therapy but comes back right away. Over the last few weeks, she has had trouble sleeping due to the pain. She describes neck pain as well as shooting pain down her right arm with associated numbness and tingling of her fingers. The pain has been intermittent and severe at times, limiting her ability to perform her ADLs effectively.  She has been taking the prescribed gabapentin and as needed Tylenol without significant relief.  She also wants to know the results of her MRI so I informed her that she does have cervical disc protrusion in her neck which is likely the cause of her radicular symptoms. On exam, she does have tenderness to palpation of the C-spine and paraspinals muscles but Spurling test was negative.  She also reports decreased sensation to her right upper extremity, especially to her fingers.  I will refer her to neurosurgery, increase her gabapentin and provide a short course of muscle relaxer.  She tells me that she just finished PT so she will need another referral to continue while waiting for neurosurgery appointment.  Plan: -Referral to neurosurgery -Referral to physical therapy -Increase gabapentin to 300 mg 3 times daily -Start Robaxin 500 mg every 8 hours as needed for muscle spasm, #30 tabs -Continue home stretches and as needed Tylenol and NSAIDs -Follow-up as needed

## 2022-10-12 NOTE — Progress Notes (Signed)
Internal Medicine Clinic Attending  Case discussed with the resident at the time of the visit.  We reviewed the resident's history and exam and pertinent patient test results.  I agree with the assessment, diagnosis, and plan of care documented in the resident's note.  

## 2022-10-23 ENCOUNTER — Ambulatory Visit: Payer: Medicaid Other | Admitting: Physical Therapy

## 2022-11-22 ENCOUNTER — Ambulatory Visit: Payer: Medicaid Other | Admitting: Family

## 2022-11-26 ENCOUNTER — Encounter: Payer: Medicaid Other | Admitting: Student

## 2023-01-03 ENCOUNTER — Other Ambulatory Visit: Payer: Self-pay | Admitting: Student

## 2023-01-14 ENCOUNTER — Ambulatory Visit
Admission: RE | Admit: 2023-01-14 | Discharge: 2023-01-14 | Disposition: A | Discharge status: Home / Self Care | Payer: Medicaid Other | Source: Ambulatory Visit | Attending: Internal Medicine | Admitting: Internal Medicine

## 2023-01-14 VITALS — BP 155/88 | HR 69 | Temp 98.4°F | Resp 18 | Ht 64.0 in | Wt 178.0 lb

## 2023-01-14 DIAGNOSIS — R21 Rash and other nonspecific skin eruption: Secondary | ICD-10-CM

## 2023-01-14 MED ORDER — METHYLPREDNISOLONE ACETATE 80 MG/ML IJ SUSP
80.0000 mg | Freq: Once | INTRAMUSCULAR | Status: AC
  Administered 2023-01-14: 80 mg via INTRAMUSCULAR

## 2023-01-14 MED ORDER — DEXAMETHASONE SODIUM PHOSPHATE 10 MG/ML IJ SOLN: 10.0000 mg | Freq: Once | INTRAMUSCULAR | Status: DC

## 2023-01-14 NOTE — Discharge Instructions (Signed)
You are given a steroid shot today in urgent care.  Will avoid topical cream given it interacts with your [place]. Follow-up if any symptoms persist or worsen.

## 2023-01-14 NOTE — ED Provider Notes (Signed)
[name]    [name]: [id number] Arrival date & time: 01/14/23  1746      History   Chief Complaint Chief Complaint  Patient presents with   Rash    HPI Vanessa Browning is a 57 y.o. female.   Patient presents with itchy rash to posterior neck and bilateral arms that started yesterday.  Denies any associated fever or known sick contacts.  Denies any obvious changes to the environment but reports that rash started after she was cleaning out some family member's house recently.  Has applied alcohol to area with minimal improvement.  Patient is not reporting any feelings of throat closing or shortness of breath.  Blood pressure is elevated today.  Reports that she took blood pressure medication in the past but PCP discontinued it given blood pressure was normal.  Patient not reporting any chest pain, shortness of breath, dizziness, blurred vision, nausea, vomiting.   Rash   Past Medical History:  Diagnosis Date   Allergy    seasonal   GERD (gastroesophageal reflux disease)    HIV (human immunodeficiency virus infection) (HCC)    Hyperlipidemia    Kidney stone    Substance abuse [facility])     Patient Active Problem List   Diagnosis Date Noted   Breast pain, left 08/07/2022   History of hyperglycemia 12/18/2021   Chronic low back pain without sciatica 07/24/2021   Generalized anxiety disorder 02/12/2020   Essential hypertension 02/12/2020   Healthcare maintenance 09/04/2018   LGSIL on Pap smear of cervix 11/06/2017   Obesity, Class I, BMI 30-34.9 01/20/2016   Hyperlipidemia 10/01/2012   Tobacco abuse 05/31/2011   Cervical herniated disc 12/09/2008   Mononeuropathy 08/19/2008   Human immunodeficiency virus (HIV) disease (HCC) 01/13/2007   Allergic rhinitis 01/13/2007    Past Surgical History:  Procedure Laterality Date   APPENDECTOMY     APPENDECTOMY     CHOLECYSTECTOMY     INDUCED ABORTION     TONSILLECTOMY      OB History     Gravida  6   Para   0   Term  0   Preterm  0   AB  1   Living         SAB  0   IAB  0   Ectopic  0   Multiple      Live Births               Home Medications    Prior to Admission medications   Medication Sig Start Date End Date Taking? Authorizing Provider  albuterol (VENTOLIN HFA) 108 (90 Base) MCG/ACT inhaler Inhale 1-2 puffs into the lungs every 6 (six) hours as needed for wheezing or shortness of breath. 02/10/21  Yes Wallis Bamberg, PA-C  elvitegravir-cobicistat-emtricitabine-tenofovir (GENVOYA) 150-150-200-10 MG TABS tablet Take 1 tablet by mouth daily with breakfast. 10/02/22  Yes Veryl Speak, FNP  methocarbamol (ROBAXIN) 500 MG tablet Take 1 tablet (500 mg total) by mouth every 8 (eight) hours as needed for muscle spasms. 10/08/22  Yes Steffanie Rainwater, MD  olopatadine (PATANOL) 0.1 % ophthalmic solution Place 1 drop into both eyes 2 (two) times daily. 08/20/22  Yes Carlisle Beers, FNP  trimethoprim-polymyxin b (POLYTRIM) ophthalmic solution Place 1 drop into the right eye every 4 (four) hours. Use as directed for 5-7 days. 08/20/22  Yes Carlisle Beers, FNP  varenicline (CHANTIX) 1 MG tablet TAKE 1 TABLET BY MOUTH TWICE A DAY 03/26/22  Yes Geraldo Pitter,  Jomarie Longs, DO  venlafaxine XR (EFFEXOR-XR) 37.5 MG 24 hr capsule TAKE 1 CAPSULE BY MOUTH DAILY WITH BREAKFAST. 01/03/23  Yes Rocky Morel, DO  gabapentin (NEURONTIN) 300 MG capsule Take 1 capsule (300 mg total) by mouth 3 (three) times daily. 10/08/22 01/06/23  Steffanie Rainwater, MD  lisinopril-hydrochlorothiazide (ZESTORETIC) 20-12.5 MG tablet Take 1 tablet by mouth daily. 12/18/21 12/18/22  Katsadouros, Vasilios, MD  rosuvastatin (CRESTOR) 5 MG tablet TAKE 1 TABLET (5 MG TOTAL) BY MOUTH DAILY. 09/07/21 09/07/22  Elige Radon, MD    Family History Family History  Problem Relation Age of Onset   Diabetes Brother    Hypertension Brother    Arthritis Mother    Healthy Father    Stomach cancer Neg Hx    Rectal cancer Neg  Hx    Colon cancer Neg Hx    Colon polyps Neg Hx     Social History Social History   Tobacco Use   Smoking status: Some Days    Current packs/day: 0.10    Average packs/day: 0.1 packs/day for 30.0 years (3.0 ttl pk-yrs)    Types: Cigarettes   Smokeless tobacco: Never   Tobacco comments:    "cutting back," patient is ready to quit, takes about a week to go through a pack  Vaping Use   Vaping status: Former  Substance Use Topics   Alcohol use: Yes    Comment: occasional   Drug use: No    Comment: greater than 35 years ago     Allergies   Dexilant [dexlansoprazole]   Review of Systems Review of Systems Per HPI  Physical Exam Triage Vital Signs ED Triage Vitals  Encounter Vitals Group     BP 01/14/23 1823 (!) 183/108     Systolic BP Percentile --      Diastolic BP Percentile --      Pulse Rate 01/14/23 1823 69     Resp 01/14/23 1823 18     Temp 01/14/23 1823 98.4 F (36.9 C)     Temp Source 01/14/23 1823 Oral     SpO2 01/14/23 1823 98 %     Weight 01/14/23 1826 178 lb (80.7 kg)     Height 01/14/23 1826 5\' 4"  (1.626 m)     Head Circumference --      Peak Flow --      Pain Score 01/14/23 1826 0     Pain Loc --      Pain Education --      Exclude from Growth Chart --    No data found.  Updated Vital Signs BP (!) 155/88   Pulse 69   Temp 98.4 F (36.9 C) (Oral)   Resp 18   Ht 5\' 4"  (1.626 m)   Wt 178 lb (80.7 kg)   SpO2 98%   BMI 30.55 kg/m   Visual Acuity Right Eye Distance:   Left Eye Distance:   Bilateral Distance:    Right Eye Near:   Left Eye Near:    Bilateral Near:     Physical Exam Constitutional:      General: She is not in acute distress.    Appearance: Normal appearance. She is not toxic-appearing or diaphoretic.  HENT:     Head: Normocephalic and atraumatic.  Eyes:     Extraocular Movements: Extraocular movements intact.     Conjunctiva/sclera: Conjunctivae normal.  Pulmonary:     Effort: Pulmonary effort is normal.   Skin:    Comments: Has a scattered, flat erythematous rash  scattered throughout posterior neck and bilateral forearms. Excoriation noted from scratching as well.   Neurological:     General: No focal deficit present.     Mental Status: She is alert and oriented to person, place, and time. Mental status is at baseline.  Psychiatric:        Mood and Affect: Mood normal.        Behavior: Behavior normal.        Thought Content: Thought content normal.        Judgment: Judgment normal.      UC Treatments / Results  Labs (all labs ordered are listed, but only abnormal results are displayed) Labs Reviewed - No data to display  EKG   Radiology No results found.  Procedures Procedures (including critical care time)  Medications Ordered in UC Medications  methylPREDNISolone acetate (DEPO-MEDROL) injection 80 mg (80 mg Intramuscular Given 01/14/23 1855)    Initial Impression / Assessment and Plan / UC Course  I have reviewed the triage vital signs and the nursing notes.  Pertinent labs & imaging results that were available during my care of the patient were reviewed by me and considered in my medical decision making (see chart for details).     I am not sure exact etiology of patient's rash but it appears to be most consistent with some form of allergic contact dermatitis.  It does not appear to be consistent with insect bite, mite infestation, viral infection, bacterial infection, fungal infection.  I do think given how diffuse rash is that she would benefit from steroid therapy.  She has taken steroids previously.  She does have history of HIV but most recent lab work was normal so do think that 1 dose of steroid would be safe.  IM Depo-Medrol ministered in urgent care.  Will avoid topical triamcinolone given it can interact with patient's daily medication.  Advised her to follow-up if rash persists or worsens.  Blood pressure slightly elevated but improved on recheck.  Encouraged her  to monitor blood pressure closely at home and follow-up with PCP if it remains elevated.  Patient verbalized understanding and was agreeable with plan. Final Clinical Impressions(s) / UC Diagnoses   Final diagnoses:  Rash and nonspecific skin eruption     Discharge Instructions      You are given a steroid shot today in urgent care.  Will avoid topical cream given it interacts with your Genvoya. Follow-up if any symptoms persist or worsen.    ED Prescriptions   None    PDMP not reviewed this encounter.   Gustavus Bryant, [place] 01/14/23 [phone]

## 2023-01-14 NOTE — ED Triage Notes (Signed)
Patient c/o a rash that's on her arms and neck since yesterday.  Extremely itchy.  Denies any new lotions, soaps, detergents.  Patient did recently clean out a family members house.  Patient has been applying alcohol.

## 2023-01-16 ENCOUNTER — Other Ambulatory Visit: Payer: Self-pay

## 2023-01-16 ENCOUNTER — Encounter: Payer: Self-pay | Admitting: Family

## 2023-01-16 ENCOUNTER — Ambulatory Visit (INDEPENDENT_AMBULATORY_CARE_PROVIDER_SITE_OTHER): Payer: Medicaid Other | Admitting: Family

## 2023-01-16 VITALS — BP 157/84 | HR 62 | Temp 97.4°F | Resp 16 | Wt 178.8 lb

## 2023-01-16 DIAGNOSIS — B20 Human immunodeficiency virus [HIV] disease: Secondary | ICD-10-CM | POA: Diagnosis not present

## 2023-01-16 DIAGNOSIS — Z720 Tobacco use: Secondary | ICD-10-CM

## 2023-01-16 DIAGNOSIS — Z23 Encounter for immunization: Secondary | ICD-10-CM | POA: Diagnosis not present

## 2023-01-16 DIAGNOSIS — Z79899 Other long term (current) drug therapy: Secondary | ICD-10-CM

## 2023-01-16 DIAGNOSIS — F17210 Nicotine dependence, cigarettes, uncomplicated: Secondary | ICD-10-CM

## 2023-01-16 MED ORDER — GENVOYA 150-150-200-10 MG PO TABS
1.0000 | ORAL_TABLET | Freq: Every day | ORAL | 6 refills | Status: DC
Start: 2023-01-16 — End: 2023-04-09

## 2023-01-16 NOTE — Patient Instructions (Addendum)
Nice to see you.  We will check your lab work today.  Continue to take your medication daily as prescribed.  Refills have been sent to the pharmacy.  Plan for follow up in 4 months or sooner if needed with lab work on the same day.  Have a great day and stay safe!  MyChart Username [id number]  [email or web address]

## 2023-01-16 NOTE — Progress Notes (Signed)
Brief Narrative   Patient ID: Vanessa Browning, female    DOB: [birth year], 57 y.o.   MRN: [id number]  Vanessa Browning is a 57 y/o AA female diagnosed with HIV disease around 59 with risk factor of heterosexual contact. Initial CD4 count and viral load unknown. Genotype fro 03/2013 with K103N resistance. No history of opportunistic infection. [id number] negative. Previous ART history with Kaletra, Isentress, Truvada and now Genvoya.   Subjective:    Chief Complaint  Patient presents with   Follow-up    B20     HPI:  Vanessa Browning is a 58 y.o. female with HIV disease last seen on 01/14/23 with well controlled virus and good adherence and tolerance to Genvoya. Viral load was undetectable and CD4 count 521. Kidney function, liver function and electrolytes within normal ranges. Recently seen at Urgent Care with acute onset rash diagnosed as likely allergic dermatitis and was treated with DepoMedrol injection and triamcinolone cream as needed. Here today for follow up.  [name] has been doing well since her last office visit and her rash is slowly improving. Continues to take her Genvoya as prescribed with no adverse side effects and occasional missed dose. Unclear as to what medications she is supposed to be taking outside of her Vanessa Browning and does have financial hardship obtaining some of the medications. Her mother recently fell and bruised her hip and has been helping to care for her. Condoms and STD testing offered. Due for Prevnar 20.  Denies fevers, chills, night sweats, headaches, changes in vision, neck pain/stiffness, nausea, diarrhea, vomiting, lesions or rashes.   Allergies  Allergen Reactions   Dexilant [Dexlansoprazole]     Hands swelling       Outpatient Medications Prior to Visit  Medication Sig Dispense Refill   albuterol (VENTOLIN HFA) 108 (90 Base) MCG/ACT inhaler Inhale 1-2 puffs into the lungs every 6 (six) hours as needed for wheezing or shortness of  breath. 18 g 0   elvitegravir-cobicistat-emtricitabine-tenofovir (GENVOYA) 150-150-200-10 MG TABS tablet Take 1 tablet by mouth daily with breakfast. 30 tablet 6   gabapentin (NEURONTIN) 300 MG capsule Take 1 capsule (300 mg total) by mouth 3 (three) times daily. (Patient not taking: Reported on 01/16/2023) 90 capsule 2   lisinopril-hydrochlorothiazide (ZESTORETIC) 20-12.5 MG tablet Take 1 tablet by mouth daily. (Patient not taking: Reported on 01/16/2023) 30 tablet 11   methocarbamol (ROBAXIN) 500 MG tablet Take 1 tablet (500 mg total) by mouth every 8 (eight) hours as needed for muscle spasms. (Patient not taking: Reported on 01/16/2023) 30 tablet 0   olopatadine (PATANOL) 0.1 % ophthalmic solution Place 1 drop into both eyes 2 (two) times daily. (Patient not taking: Reported on 01/16/2023) 5 mL 0   rosuvastatin (CRESTOR) 5 MG tablet TAKE 1 TABLET (5 MG TOTAL) BY MOUTH DAILY. (Patient not taking: Reported on 01/16/2023) 90 tablet 1   trimethoprim-polymyxin b (POLYTRIM) ophthalmic solution Place 1 drop into the right eye every 4 (four) hours. Use as directed for 5-7 days. (Patient not taking: Reported on 01/16/2023) 10 mL 0   varenicline (CHANTIX) 1 MG tablet TAKE 1 TABLET BY MOUTH TWICE A DAY (Patient not taking: Reported on 01/16/2023) 180 tablet 1   venlafaxine XR (EFFEXOR-XR) 37.5 MG 24 hr capsule TAKE 1 CAPSULE BY MOUTH DAILY WITH BREAKFAST. (Patient not taking: Reported on 01/16/2023) 90 capsule 1   No facility-administered medications prior to visit.     Past Medical History:  Diagnosis Date   Allergy  seasonal   GERD (gastroesophageal reflux disease)    HIV (human immunodeficiency virus infection) (HCC)    Hyperlipidemia    Kidney stone    Substance abuse (HCC)      Past Surgical History:  Procedure Laterality Date   APPENDECTOMY     APPENDECTOMY     CHOLECYSTECTOMY     INDUCED ABORTION     TONSILLECTOMY        Review of Systems  Constitutional:  Negative for chills, diaphoresis,  fatigue and fever.  Respiratory:  Negative for cough, chest tightness, shortness of breath and wheezing.   Cardiovascular:  Negative for chest pain.  Gastrointestinal:  Negative for abdominal pain, diarrhea, nausea and vomiting.      Objective:    BP (!) 157/84   Pulse 62   Temp (!) 97.4 F (36.3 C) (Temporal)   Resp 16   Wt 178 lb 12.8 oz (81.1 kg)   SpO2 98%   BMI 30.69 kg/m  Nursing note and vital signs reviewed.  Physical Exam Constitutional:      General: She is not in acute distress.    Appearance: She is well-developed.  Cardiovascular:     Rate and Rhythm: Normal rate and regular rhythm.     Heart sounds: Normal heart sounds.  Pulmonary:     Effort: Pulmonary effort is normal.     Breath sounds: Normal breath sounds.  Skin:    General: Skin is warm and dry.  Neurological:     Mental Status: She is alert and oriented to person, place, and time.  Psychiatric:        Mood and Affect: Mood normal.         01/16/2023    3:37 PM 10/08/2022    9:14 AM 10/02/2022   10:47 AM 04/09/2022   10:45 AM 02/28/2022   10:29 AM  Depression screen PHQ 2/9  Decreased Interest 0 0 0 0 0  Down, Depressed, Hopeless 0 1 0 0 0  PHQ - 2 Score 0 1 0 0 0       Assessment & Plan:    Patient Active Problem List   Diagnosis Date Noted   Polypharmacy 01/16/2023   Breast pain, left 08/07/2022   History of hyperglycemia 12/18/2021   Chronic low back pain without sciatica 07/24/2021   Generalized anxiety disorder 02/12/2020   Essential hypertension 02/12/2020   Healthcare maintenance 09/04/2018   LGSIL on Pap smear of cervix 11/06/2017   Obesity, Class I, BMI 30-34.9 01/20/2016   Hyperlipidemia 10/01/2012   Tobacco abuse 05/31/2011   Cervical herniated disc 12/09/2008   Mononeuropathy 08/19/2008   Human immunodeficiency virus (HIV) disease (HCC) 01/13/2007   Allergic rhinitis 01/13/2007     Problem List Items Addressed This Visit       Other   Human immunodeficiency  virus (HIV) disease (HCC) - Primary (Chronic)    [name] continues to have well controlled virus with good adherence and tolerance to Genvoya. Reviewed previous lab work and discussed plan of care and U equals U. Check lab work. Continue current dose of Genovya. Plan for follow up in 4 months or sooner if needed with lab work on the same day.       Relevant Medications   elvitegravir-cobicistat-emtricitabine-tenofovir (GENVOYA) 150-150-200-10 MG TABS tablet   Other Relevant Orders   BASIC METABOLIC PANEL WITH GFR   HIV-1 RNA quant-no reflex-bld   T-helper cells (CD4) count (not at [facility])   Tobacco abuse (Chronic)    Ms.  [name] continues to use tobacco some days. Reviewed importance of tobacco cessation to reduce risk of malignant, respiratory, cardiovascular, and renal disease in the future. Remains in the contemplation stage of change.       Polypharmacy    Dezare is unclear as to what medications she is taking and has financial difficulties obtaining some of the medications. Encouraged to review medications with Internal Medicine or bring medications to next appointment to review.       Other Visit Diagnoses     Need for pneumococcal 20-valent conjugate vaccination       Relevant Orders   Pneumococcal conjugate vaccine 20-valent (Prevnar-20) (Completed)        I am having Arnetha Gula Figgs maintain her albuterol, rosuvastatin, lisinopril-hydrochlorothiazide, varenicline, olopatadine, trimethoprim-polymyxin b, methocarbamol, gabapentin, venlafaxine XR, and Genvoya.   Meds ordered this encounter  Medications   elvitegravir-cobicistat-emtricitabine-tenofovir (GENVOYA) 150-150-200-10 MG TABS tablet    Sig: Take 1 tablet by mouth daily with breakfast.    Dispense:  30 tablet    Refill:  6    Order Specific Question:   Supervising Provider    Answer:   Judyann Munson [[id number]]     Follow-up: Return in about 4 months (around 05/18/2023), or if symptoms worsen or fail to  improve.   Marcos Eke, MSN, FNP-C Nurse Practitioner [facility] for Infectious Disease [facility] Main number: [phone]

## 2023-01-16 NOTE — Assessment & Plan Note (Signed)
Vanessa Browning is unclear as to what medications she is taking and has financial difficulties obtaining some of the medications. Encouraged to review medications with Internal Medicine or bring medications to next appointment to review.

## 2023-01-16 NOTE — Assessment & Plan Note (Signed)
[name] continues to have well controlled virus with good adherence and tolerance to Genvoya. Reviewed previous lab work and discussed plan of care and U equals U. Check lab work. Continue current dose of Genovya. Plan for follow up in 4 months or sooner if needed with lab work on the same day.

## 2023-01-16 NOTE — Assessment & Plan Note (Signed)
[name] continues to use tobacco some days. Reviewed importance of tobacco cessation to reduce risk of malignant, respiratory, cardiovascular, and renal disease in the future. Remains in the contemplation stage of change.

## 2023-01-23 ENCOUNTER — Encounter: Payer: Self-pay | Admitting: Student

## 2023-01-23 ENCOUNTER — Ambulatory Visit: Payer: Medicaid Other | Admitting: Student

## 2023-01-23 VITALS — BP 144/67 | HR 70 | Temp 97.7°F | Ht 64.0 in | Wt 178.3 lb

## 2023-01-23 DIAGNOSIS — I10 Essential (primary) hypertension: Secondary | ICD-10-CM | POA: Diagnosis present

## 2023-01-23 DIAGNOSIS — Z6830 Body mass index (BMI) 30.0-30.9, adult: Secondary | ICD-10-CM

## 2023-01-23 DIAGNOSIS — E669 Obesity, unspecified: Secondary | ICD-10-CM

## 2023-01-23 DIAGNOSIS — M5020 Other cervical disc displacement, unspecified cervical region: Secondary | ICD-10-CM

## 2023-01-23 DIAGNOSIS — F17210 Nicotine dependence, cigarettes, uncomplicated: Secondary | ICD-10-CM | POA: Diagnosis not present

## 2023-01-23 DIAGNOSIS — Z720 Tobacco use: Secondary | ICD-10-CM

## 2023-01-23 DIAGNOSIS — Z0000 Encounter for general adult medical examination without abnormal findings: Secondary | ICD-10-CM

## 2023-01-23 MED ORDER — METHOCARBAMOL 500 MG PO TABS
500.0000 mg | ORAL_TABLET | Freq: Three times a day (TID) | ORAL | 0 refills | Status: DC | PRN
Start: 2023-01-23 — End: 2023-07-25

## 2023-01-23 MED ORDER — GABAPENTIN 300 MG PO CAPS
300.0000 mg | ORAL_CAPSULE | Freq: Three times a day (TID) | ORAL | 2 refills | Status: AC
Start: 2023-01-23 — End: 2023-10-24

## 2023-01-23 MED ORDER — LISINOPRIL 20 MG PO TABS: 20.0000 mg | ORAL_TABLET | Freq: Every day | ORAL | 11 refills | Status: DC

## 2023-01-23 MED ORDER — VARENICLINE TARTRATE 0.5 MG PO TABS
0.5000 mg | ORAL_TABLET | Freq: Two times a day (BID) | ORAL | 1 refills | Status: DC
Start: 2023-01-23 — End: 2023-02-14

## 2023-01-23 NOTE — Patient Instructions (Addendum)
Thank you, Ms.Alessi Warr Figgs for allowing [place] to provide your care today.   Blood pressure  We are going to start you on a blood pressure medication.  -Start taking lisinopril 20 mg  _we will check your kidney function today -Please continue to measure your blood pressure at home and bring the paper work to the next visit -Avoid salty foods, canned foods (rinse the water that the come in), potato chips  Blood pressure tips   It is best to check your BP 1-2 hours after taking your medications to see the medications effectiveness on your BP.    Here are some tips that our clinical pharmacists share for home BP monitoring:          Rest 10 minutes before taking your blood pressure.          Don't smoke or drink caffeinated beverages for at least 30 minutes before.          Take your blood pressure before (not after) you eat.          Sit comfortably with your back supported and both feet on the floor (don't cross your legs).          Elevate your arm to heart level on a table or a desk.          Use the proper sized cuff. It should fit smoothly and snugly around your bare upper arm. There should be enough room to slip a fingertip under the cuff. The bottom edge of the cuff should be 1 inch above the crease of the elbow.  Tobacco cessation Start Chantix or Varenicline- Initial: Days 1 to 3: 0.5 mg once daily. Days 4 to 7: 0.5 mg every 12 hours daily. Maintenance (day 8 and later): two tablets in the morning and two tablets at night   Health care maintenance You are in need of a pap smear which is how we screen for cancer of the cervix. If you change your mind about doing it at your HIV clinic, you can do it at your follow up in one month  I have ordered the following labs for you:  Lab Orders         BMP8+Anion Gap       I will call if any are abnormal. All of your labs can be accessed through "My Chart".   My Chart  Access: [email or web address]  Please follow-up in: 1 months    We look forward to seeing you next time. Please call our clinic at [phone] if you have any questions or concerns. The best time to call is Monday-Friday from 9am-4pm, but there is someone available 24/7. If after hours or the weekend, call the main hospital number and ask for the Internal Medicine Resident On-Call. If you need medication refills, please notify your pharmacy one week in advance and they will send [place] a request.   Thank you for letting us take part in your care. Wishing you the best!  Morene Crocker, MD 01/23/2023, 9:32 AM [facility] Internal Medicine Resident, PGY-2

## 2023-01-23 NOTE — Progress Notes (Unsigned)
Subjective:  CC: follow up  HPI:  [name] is a 57 y.o. female with a past medical history stated below and presents today for follow up. Please see problem based assessment and plan for additional details.  Past Medical History:  Diagnosis Date   Allergy    seasonal   GERD (gastroesophageal reflux disease)    HIV (human immunodeficiency virus infection) (HCC)    Hyperlipidemia    Kidney stone    Substance abuse (HCC)     Current Outpatient Medications on File Prior to Visit  Medication Sig Dispense Refill   albuterol (VENTOLIN HFA) 108 (90 Base) MCG/ACT inhaler Inhale 1-2 puffs into the lungs every 6 (six) hours as needed for wheezing or shortness of breath. 18 g 0   elvitegravir-cobicistat-emtricitabine-tenofovir (GENVOYA) 150-150-200-10 MG TABS tablet Take 1 tablet by mouth daily with breakfast. 30 tablet 6   olopatadine (PATANOL) 0.1 % ophthalmic solution Place 1 drop into both eyes 2 (two) times daily. (Patient not taking: Reported on 01/16/2023) 5 mL 0   rosuvastatin (CRESTOR) 5 MG tablet TAKE 1 TABLET (5 MG TOTAL) BY MOUTH DAILY. (Patient not taking: Reported on 01/16/2023) 90 tablet 1   trimethoprim-polymyxin b (POLYTRIM) ophthalmic solution Place 1 drop into the right eye every 4 (four) hours. Use as directed for 5-7 days. (Patient not taking: Reported on 01/16/2023) 10 mL 0   venlafaxine XR (EFFEXOR-XR) 37.5 MG 24 hr capsule TAKE 1 CAPSULE BY MOUTH DAILY WITH BREAKFAST. (Patient not taking: Reported on 01/16/2023) 90 capsule 1   No current facility-administered medications on file prior to visit.    Family History  Problem Relation Age of Onset   Diabetes Brother    Hypertension Brother    Arthritis Mother    Healthy Father    Stomach cancer Neg Hx    Rectal cancer Neg Hx    Colon cancer Neg Hx    Colon polyps Neg Hx     Social History   Socioeconomic History   Marital status: Unknown    Spouse name: Not on file   Number of children: Not on file    Years of education: Not on file   Highest education level: Not on file  Occupational History   Occupation: PCA  Tobacco Use   Smoking status: Some Days    Current packs/day: 0.10    Average packs/day: 0.1 packs/day for 30.0 years (3.0 ttl pk-yrs)    Types: Cigarettes   Smokeless tobacco: Never   Tobacco comments:    "cutting back," patient is ready to quit, takes about a week to go through a pack  Vaping Use   Vaping status: Former  Substance and Sexual Activity   Alcohol use: Yes    Comment: occasional   Drug use: No    Comment: greater than 35 years ago   Sexual activity: Not Currently    Partners: Male    Comment: declined condoms  Other Topics Concern   Not on file  Social History Narrative   ** Merged History Encounter **       Social Determinants of Health   Financial Resource Strain: Low Risk  (07/29/2017)   Overall Financial Resource Strain (CARDIA)    Difficulty of Paying Living Expenses: Not hard at all  Food Insecurity: Food Insecurity Present (10/08/2022)   Hunger Vital Sign    Worried About Running Out of Food in the Last Year: Sometimes true    Ran Out of Food in the Last Year: Never  true  Transportation Needs: No Transportation Needs (10/08/2022)   PRAPARE - Administrator, Civil Service (Medical): No    Lack of Transportation (Non-Medical): No  Physical Activity: Not on file  Stress: Not on file  Social Connections: Not on file  Intimate Partner Violence: Not on file    Review of Systems: ROS negative except for what is noted on the assessment and plan.  Objective:   Vitals:   01/23/23 0901  BP: (!) 144/67  Pulse: 70  Temp: 97.7 F (36.5 C)  TempSrc: Oral  SpO2: 100%  Weight: 178 lb 4.8 oz (80.9 kg)  Height: 5\' 4"  (1.626 m)    Physical Exam: Constitutional: well-appearing woman in no acute distress HENT: normocephalic atraumatic, mucous membranes moist Eyes: conjunctiva non-erythematous Neck: supple Cardiovascular: regular  rate and rhythm, no m/r/g Pulmonary/Chest: normal work of breathing on room air, lungs clear to auscultation bilaterally Abdominal: soft, non-tender, non-distended MSK: normal bulk and tone Neurological: alert & oriented x 3,  normal gait Skin: warm and dry Psych: Pleasant mood and affect       01/16/2023    3:37 PM  Depression screen PHQ 2/9  Decreased Interest 0  Down, Depressed, Hopeless 0  PHQ - 2 Score 0       02/12/2020   11:38 AM  GAD 7 : Generalized Anxiety Score  Nervous, Anxious, on Edge 3  Control/stop worrying 3  Worry too much - different things 3  Trouble relaxing 3  Restless 3  Easily annoyed or irritable 3  Afraid - awful might happen 3  Total GAD 7 Score 21  Anxiety Difficulty Somewhat difficult     Assessment & Plan:   Essential hypertension Patient previously on Lisinopril-hydrochlorothiazide until March of this year. She mentioned her BP was at goal without use of medication and lifestyle modifications such a weight management and tobacco cessation therapy. She mentions that for the past few months, she has noticed an uptrend in her weight which she relates to decreased physical activity and has returned to using nicotine products.  Her systolic blood pressures at home have been >160 and diastolic 80-90. Discussed restarting therapy today and patient agrees. She denies HA, chest pain, SOB, or changes in vision today. -Lisinopril 20 mg daily -BMP today -Encouraged dietary modifications -See smoking cessation for information about treatemnt  Obesity, Class I, BMI 30-34.9 Patient commits to working of dietary and physical movement strategies at this time.  She would like to follow up with this at the next OV. Today's weight in office was 178 lb.  Tobacco abuse Patient ready to enact change with sustained blood pressure elevation. Discussed Chantix induction therapy, which patient has used in the past with good response.  Days 1 to 3: 0.5 mg once  daily. Days 4 to 7: 0.5 mg every 12 hours daily. Day 8-onward, 1 mg BID -RTC in one month for monitoring  Healthcare maintenance Patient with scheduled appointment to PAP clinic at [facility] clinic  Cervical herniated disc Stable and managed with medications.  -refilled gabapentin and methocarbamol today   Return in about 4 weeks (around 02/20/2023) for HTN, BMP, and follow up on patient's' healthcare maintenace.  Patient discussed with Dr. Lupita Leash, MD [facility] - PGY-1 01/24/2023, 3:01 PM

## 2023-01-24 NOTE — Assessment & Plan Note (Signed)
Patient commits to working of dietary and physical movement strategies at this time.  She would like to follow up with this at the next OV. Today's weight in office was 178 lb.

## 2023-01-24 NOTE — Assessment & Plan Note (Signed)
Patient ready to enact change with sustained blood pressure elevation. Discussed Chantix induction therapy, which patient has used in the past with good response.  Days 1 to 3: 0.5 mg once daily. Days 4 to 7: 0.5 mg every 12 hours daily. Day 8-onward, 1 mg BID -RTC in one month for monitoring

## 2023-01-24 NOTE — Assessment & Plan Note (Signed)
Patient with scheduled appointment to PAP clinic at [facility]

## 2023-01-24 NOTE — Assessment & Plan Note (Signed)
Stable and managed with medications.  -refilled gabapentin and methocarbamol today

## 2023-01-24 NOTE — Assessment & Plan Note (Signed)
Patient previously on Lisinopril-hydrochlorothiazide until March of this year. She mentioned her BP was at goal without use of medication and lifestyle modifications such a weight management and tobacco cessation therapy. She mentions that for the past few months, she has noticed an uptrend in her weight which she relates to decreased physical activity and has returned to using nicotine products.  Her systolic blood pressures at home have been >160 and diastolic 80-90. Discussed restarting therapy today and patient agrees. She denies HA, chest pain, SOB, or changes in vision today. -Lisinopril 20 mg daily -BMP today -Encouraged dietary modifications -See smoking cessation for information about treatemnt

## 2023-01-24 NOTE — Progress Notes (Signed)
Internal Medicine Clinic Attending  Case discussed with the resident at the time of the visit.  We reviewed the resident's history and exam and pertinent patient test results.  I agree with the assessment, diagnosis, and plan of care documented in the resident's note.  

## 2023-01-25 LAB — BMP8+ANION GAP
Anion Gap: 16 mmol/L (ref 10.0–18.0)
BUN/Creatinine Ratio: 22 (ref 9–23)
BUN: 19 mg/dL (ref 6–24)
CO2: 23 mmol/L (ref 20–29)
Calcium: 10.1 mg/dL (ref 8.7–10.2)
Chloride: 101 mmol/L (ref 96–106)
Creatinine, Ser: 0.87 mg/dL (ref 0.57–1.00)
Glucose: 72 mg/dL (ref 70–99)
Potassium: 3.9 mmol/L (ref 3.5–5.2)
Sodium: 140 mmol/L (ref 134–144)
eGFR: 77 mL/min/{1.73_m2} (ref >59)

## 2023-01-28 ENCOUNTER — Ambulatory Visit (INDEPENDENT_AMBULATORY_CARE_PROVIDER_SITE_OTHER): Payer: Medicaid Other | Admitting: Infectious Diseases

## 2023-01-28 ENCOUNTER — Encounter: Payer: Self-pay | Admitting: Infectious Diseases

## 2023-01-28 ENCOUNTER — Other Ambulatory Visit (HOSPITAL_COMMUNITY): Admission: RE | Admit: 2023-01-28 | Payer: Medicaid Other | Source: Ambulatory Visit

## 2023-01-28 ENCOUNTER — Other Ambulatory Visit: Payer: Self-pay

## 2023-01-28 VITALS — BP 94/60 | HR 72 | Temp 97.5°F | Resp 16 | Wt 175.4 lb

## 2023-01-28 DIAGNOSIS — Z124 Encounter for screening for malignant neoplasm of cervix: Secondary | ICD-10-CM | POA: Diagnosis present

## 2023-01-28 DIAGNOSIS — Z113 Encounter for screening for infections with a predominantly sexual mode of transmission: Secondary | ICD-10-CM | POA: Insufficient documentation

## 2023-01-28 NOTE — Progress Notes (Signed)
      Subjective :    Vanessa Browning is a 57 y.o. female here for an annual pelvic exam and pap smear.   Review of Systems: Current GYN complaints or concerns: none. No sexual partners. No menses in many years.  Remote h/o HPV (non-16/18/45) but nothing in the last few screenings.   Patient denies any abdominal/pelvic pain, problems with bowel movements, urination, vaginal discharge or intercourse.   Past Medical History:  Diagnosis Date   Allergy    seasonal   GERD (gastroesophageal reflux disease)    HIV (human immunodeficiency virus infection) (HCC)    Hyperlipidemia    Kidney stone    Substance abuse (HCC)     Gynecologic History: [id number]  No LMP recorded. Patient is postmenopausal. Contraception: post menopausal status Last Pap: 2023. Results were: normal Last Mammogram: . Results were:     Objective :   Physical Exam - chaperone present  Constitutional: Well developed, well nourished, no acute distress. She is alert and oriented x3.  Pelvic: External genitalia is normal in appearance. The vagina is normal in appearance. The cervix is bulbous and easily visualized. No CMT, normal expected cervical mucus present. Bimanual exam reveals uterus that is felt to be normal size, shape, and contour. No adnexal masses or tenderness noted. Breasts: symmetrical in contour, shape and texture. No palpable masses/nodules. No nipple discharge.  Psych: She has a normal mood and affect.      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 Last screenings have returned normal.  Normal pelvic exam. Cervical brushing collected for cytology and HPV.  Discussed recommended screening interval for women living with HIV disease meant to be lifelong and at an interval of Q1-3 years pending results. Acceptable to space out Q3y with 3 consecutively normal exams. Further recommendations for Vanessa Browning to follow today's results.  Results will be communicated to the patient via mychart portal.        Relevant Orders   Cytology - PAP( [place])   Other Visit Diagnoses     Routine screening for STI (sexually transmitted infection)       Relevant Orders   Cervicovaginal ancillary only( [place])        Rexene Alberts, MSN, NP-C [facility] Office: [phone] Pager: [phone]  01/28/23 3:49 PM

## 2023-01-28 NOTE — Assessment & Plan Note (Signed)
Remote history of LSIL with HPV (-) for 16/18/45. Last screenings have returned normal.  Normal pelvic exam. Cervical brushing collected for cytology and HPV.  Discussed recommended screening interval for women living with HIV disease meant to be lifelong and at an interval of Q1-3 years pending results. Acceptable to space out Q3y with 3 consecutively normal exams. Further recommendations for Vanessa Browning to follow today's results.  Results will be communicated to the patient via mychart portal.

## 2023-01-29 LAB — CERVICOVAGINAL ANCILLARY ONLY
Chlamydia: NEGATIVE
Comment: NEGATIVE
Comment: NEGATIVE
Comment: NORMAL
Neisseria Gonorrhea: NEGATIVE
Trichomonas: NEGATIVE

## 2023-01-30 LAB — CYTOLOGY - PAP
Adequacy: ABSENT
Comment: NEGATIVE
Diagnosis: NEGATIVE
High risk HPV: NEGATIVE

## 2023-02-04 NOTE — Progress Notes (Signed)
Renal function panel and electrolytes within normal levels. No changes to current medications. Patient aware

## 2023-02-14 ENCOUNTER — Other Ambulatory Visit: Payer: Self-pay | Admitting: Student

## 2023-02-14 DIAGNOSIS — Z720 Tobacco use: Secondary | ICD-10-CM

## 2023-02-20 ENCOUNTER — Encounter: Payer: Self-pay | Admitting: Student

## 2023-02-20 ENCOUNTER — Ambulatory Visit (INDEPENDENT_AMBULATORY_CARE_PROVIDER_SITE_OTHER): Payer: Medicaid Other | Admitting: Student

## 2023-02-20 VITALS — BP 130/62 | HR 67 | Temp 97.6°F | Wt 176.2 lb

## 2023-02-20 DIAGNOSIS — Z23 Encounter for immunization: Secondary | ICD-10-CM | POA: Diagnosis present

## 2023-02-20 DIAGNOSIS — I10 Essential (primary) hypertension: Secondary | ICD-10-CM

## 2023-02-20 DIAGNOSIS — Z720 Tobacco use: Secondary | ICD-10-CM

## 2023-02-20 DIAGNOSIS — F17210 Nicotine dependence, cigarettes, uncomplicated: Secondary | ICD-10-CM | POA: Diagnosis not present

## 2023-02-20 DIAGNOSIS — M5020 Other cervical disc displacement, unspecified cervical region: Secondary | ICD-10-CM | POA: Diagnosis not present

## 2023-02-20 DIAGNOSIS — Z0000 Encounter for general adult medical examination without abnormal findings: Secondary | ICD-10-CM

## 2023-02-20 MED ORDER — DICLOFENAC SODIUM 1 % EX GEL: 2.0000 g | Freq: Four times a day (QID) | CUTANEOUS | 0 refills | Status: DC

## 2023-02-20 NOTE — Assessment & Plan Note (Addendum)
Patient was started on Chantix induction therapy last appointment.  She was uptitrated to 1 mg twice daily. Currently at one cigrette/day previously one pack/day.  -Continue Chantix

## 2023-02-20 NOTE — Assessment & Plan Note (Signed)
MRI  from 08/10/22 showed disc protrusion at disc protrusion at C5-6 with resultant mild cord flattening, but no cord signal changes or significant stenosis; left eccentric disc osteophyte complex at C6-7 with superimposed tiny left paracentral disc protrusion; mild disc bulge with right-sided uncovertebral spurring at C4-5 with resultant borderline mild right C5 foraminal stenosis. Referral was made to NSG and PT. Her pain today is different as it is likely musculoskeletal in nature and unrelated to her herniated disc.  The pain was reproducible with palpation of the spine and paraspinal region.  There were no deficits in strength upon flexion or extension of her upper extremities.  She did not endorse any numbness, tingling, weakness, or other neuropathic signs or symptoms.  She is currently taking acetaminophen and gabapentin for the neuropathic pain but this is not helping.  We will trial her on Voltaren gel with an associated short trial dose of acetaminophen for 7 days.  We also educated her on various ways to improve her musculoskeletal pain. - Begin 1% Voltaren gel 4 times daily - Short trial of acetaminophen up to 1000 mg every 6 hours as needed for 7 days - Continue physical therapy

## 2023-02-20 NOTE — Assessment & Plan Note (Signed)
Patient received her Pap smear on 01/28/2023 which was negative.  She also received her flu shot today

## 2023-02-20 NOTE — Patient Instructions (Addendum)
Thank you so much for coming to the clinic today!   For your pain, I have put in an order for VOLTAREN gel. Please apply this to your pain four times daily.   You can also try a seven day trial of acetaminophen. This can be purchased over the counter at your local [facility]. You can take up to 1000 mg every 6 hours as needed.   If you have any questions please feel free to the call the clinic at anytime at [phone]. It was a pleasure seeing you!  Best, Dr. Rayvon Char

## 2023-02-20 NOTE — Progress Notes (Signed)
CC: Follow-up  HPI: Vanessa Browning is a 57 y.o. female living with a history stated below and presents today for follow-up. Please see problem based assessment and plan for additional details.   Past Medical History:  Diagnosis Date   Allergy    seasonal   GERD (gastroesophageal reflux disease)    HIV (human immunodeficiency virus infection) (HCC)    Hyperlipidemia    Kidney stone    Substance abuse (HCC)     Current Outpatient Medications on File Prior to Visit  Medication Sig Dispense Refill   albuterol (VENTOLIN HFA) 108 (90 Base) MCG/ACT inhaler Inhale 1-2 puffs into the lungs every 6 (six) hours as needed for wheezing or shortness of breath. 18 g 0   elvitegravir-cobicistat-emtricitabine-tenofovir (GENVOYA) 150-150-200-10 MG TABS tablet Take 1 tablet by mouth daily with breakfast. 30 tablet 6   gabapentin (NEURONTIN) 300 MG capsule Take 1 capsule (300 mg total) by mouth 3 (three) times daily. 90 capsule 2   lisinopril (ZESTRIL) 20 MG tablet Take 1 tablet (20 mg total) by mouth daily. 30 tablet 11   methocarbamol (ROBAXIN) 500 MG tablet Take 1 tablet (500 mg total) by mouth every 8 (eight) hours as needed for muscle spasms. 30 tablet 0   olopatadine (PATANOL) 0.1 % ophthalmic solution Place 1 drop into both eyes 2 (two) times daily. (Patient not taking: Reported on 01/16/2023) 5 mL 0   rosuvastatin (CRESTOR) 5 MG tablet TAKE 1 TABLET (5 MG TOTAL) BY MOUTH DAILY. (Patient not taking: Reported on 01/16/2023) 90 tablet 1   trimethoprim-polymyxin b (POLYTRIM) ophthalmic solution Place 1 drop into the right eye every 4 (four) hours. Use as directed for 5-7 days. (Patient not taking: Reported on 01/16/2023) 10 mL 0   varenicline (CHANTIX) 0.5 MG tablet TAKE 1 TABLET BY MOUTH 2 TIMES DAILY. 180 tablet 1   venlafaxine XR (EFFEXOR-XR) 37.5 MG 24 hr capsule TAKE 1 CAPSULE BY MOUTH DAILY WITH BREAKFAST. (Patient not taking: Reported on 01/16/2023) 90 capsule 1   No current  facility-administered medications on file prior to visit.    Family History  Problem Relation Age of Onset   Diabetes Brother    Hypertension Brother    Arthritis Mother    Healthy Father    Stomach cancer Neg Hx    Rectal cancer Neg Hx    Colon cancer Neg Hx    Colon polyps Neg Hx     Social History   Socioeconomic History   Marital status: Unknown    Spouse name: Not on file   Number of children: Not on file   Years of education: Not on file   Highest education level: Not on file  Occupational History   Occupation: PCA  Tobacco Use   Smoking status: Some Days    Current packs/day: 0.10    Average packs/day: 0.1 packs/day for 30.0 years (3.0 ttl pk-yrs)    Types: Cigarettes   Smokeless tobacco: Never   Tobacco comments:    "cutting back," patient is ready to quit, takes about a week to go through a pack  Vaping Use   Vaping status: Former  Substance and Sexual Activity   Alcohol use: Yes    Comment: occasional   Drug use: No    Comment: greater than 35 years ago   Sexual activity: Not Currently    Partners: Male    Comment: declined condoms  Other Topics Concern   Not on file  Social History Narrative   ** Merged  History Encounter **       Social Determinants of Health   Financial Resource Strain: Low Risk  (07/29/2017)   Overall Financial Resource Strain (CARDIA)    Difficulty of Paying Living Expenses: Not hard at all  Food Insecurity: Food Insecurity Present (10/08/2022)   Hunger Vital Sign    Worried About Running Out of Food in the Last Year: Sometimes true    Ran Out of Food in the Last Year: Never true  Transportation Needs: No Transportation Needs (10/08/2022)   PRAPARE - Administrator, Civil Service (Medical): No    Lack of Transportation (Non-Medical): No  Physical Activity: Not on file  Stress: Not on file  Social Connections: Not on file  Intimate Partner Violence: Not on file    Review of Systems: ROS negative except for what  is noted on the assessment and plan.  Vitals:   02/20/23 1555  BP: 130/62  Pulse: 67  Temp: 97.6 F (36.4 C)  TempSrc: Oral  SpO2: 99%  Weight: 176 lb 3.2 oz (79.9 kg)    Physical Exam: Constitutional: well-appearing in no acute distress HENT: normocephalic atraumatic, mucous membranes moist Eyes: conjunctiva non-erythematous Neck: supple Cardiovascular: regular rate and rhythm, no m/r/g Pulmonary/Chest: normal work of breathing on room air, lungs clear to auscultation bilaterally Abdominal: soft, non-tender, non-distended MSK: normal bulk and tone; spinal and paraspinal tenderness of cervical spine Neurological: alert & oriented x 3, 5/5 strength in bilateral upper and lower extremities, normal gait Skin: warm and dry  Assessment & Plan:   Essential hypertension Patient was last seen in the clinic on 01/23/2023.  At that time, her blood pressure was around 160/90.  She was restarted on therapy.  Today, denies any vision changes, headache, chest pain, nausea, vomiting, or other signs or symptoms.  Her BMP was within normal limits at last visit.  Blood pressure today was 130/62. - Continue lisinopril 20 mg daily  Tobacco abuse Patient was started on Chantix induction therapy last appointment.  She was uptitrated to 1 mg twice daily. Currently at one cigrette/day previously one pack/day.  -Continue Chantix  Healthcare maintenance Patient received her Pap smear on 01/28/2023 which was negative.  She also received her flu shot today  Cervical herniated disc MRI  from 08/10/22 showed disc protrusion at disc protrusion at C5-6 with resultant mild cord flattening, but no cord signal changes or significant stenosis; left eccentric disc osteophyte complex at C6-7 with superimposed tiny left paracentral disc protrusion; mild disc bulge with right-sided uncovertebral spurring at C4-5 with resultant borderline mild right C5 foraminal stenosis. Referral was made to NSG and PT. Her pain today is  different as it is likely musculoskeletal in nature and unrelated to her herniated disc.  The pain was reproducible with palpation of the spine and paraspinal region.  There were no deficits in strength upon flexion or extension of her upper extremities.  She did not endorse any numbness, tingling, weakness, or other neuropathic signs or symptoms.  She is currently taking acetaminophen and gabapentin for the neuropathic pain but this is not helping.  We will trial her on Voltaren gel with an associated short trial dose of acetaminophen for 7 days.  We also educated her on various ways to improve her musculoskeletal pain. - Begin 1% Voltaren gel 4 times daily - Short trial of acetaminophen up to 1000 mg every 6 hours as needed for 7 days - Continue physical therapy   Patient seen with Dr. Heide Spark  Morrie Sheldon, MD  [facility] Internal Medicine, PGY-1 Date 02/20/2023 Time 4:42 PM

## 2023-02-20 NOTE — Assessment & Plan Note (Signed)
Patient was last seen in the clinic on 01/23/2023.  At that time, her blood pressure was around 160/90.  She was restarted on therapy.  Today, denies any vision changes, headache, chest pain, nausea, vomiting, or other signs or symptoms.  Her BMP was within normal limits at last visit.  Blood pressure today was 130/62. - Continue lisinopril 20 mg daily

## 2023-02-25 NOTE — Progress Notes (Signed)
Internal Medicine Clinic Attending  I was physically present during the key portions of the resident provided service and participated in the medical decision making of patient's management care. I reviewed pertinent patient test results.  The assessment, diagnosis, and plan were formulated together and I agree with the documentation in the resident's note.  Earl Lagos, MD

## 2023-03-31 ENCOUNTER — Other Ambulatory Visit: Payer: Self-pay | Admitting: Student

## 2023-03-31 DIAGNOSIS — M5020 Other cervical disc displacement, unspecified cervical region: Secondary | ICD-10-CM

## 2023-04-09 ENCOUNTER — Other Ambulatory Visit: Payer: Self-pay

## 2023-04-09 ENCOUNTER — Encounter: Payer: Self-pay | Admitting: Family

## 2023-04-09 ENCOUNTER — Ambulatory Visit (INDEPENDENT_AMBULATORY_CARE_PROVIDER_SITE_OTHER): Payer: Medicaid Other | Admitting: Family

## 2023-04-09 VITALS — BP 131/76 | HR 60 | Temp 97.8°F | Ht 64.0 in | Wt 182.0 lb

## 2023-04-09 DIAGNOSIS — E785 Hyperlipidemia, unspecified: Secondary | ICD-10-CM

## 2023-04-09 DIAGNOSIS — Z0000 Encounter for general adult medical examination without abnormal findings: Secondary | ICD-10-CM

## 2023-04-09 DIAGNOSIS — F17210 Nicotine dependence, cigarettes, uncomplicated: Secondary | ICD-10-CM | POA: Diagnosis not present

## 2023-04-09 DIAGNOSIS — Z23 Encounter for immunization: Secondary | ICD-10-CM | POA: Diagnosis not present

## 2023-04-09 DIAGNOSIS — B20 Human immunodeficiency virus [HIV] disease: Secondary | ICD-10-CM

## 2023-04-09 DIAGNOSIS — Z720 Tobacco use: Secondary | ICD-10-CM

## 2023-04-09 MED ORDER — ROSUVASTATIN CALCIUM 5 MG PO TABS: 5.0000 mg | ORAL_TABLET | Freq: Every day | ORAL | 1 refills | Status: DC

## 2023-04-09 MED ORDER — GENVOYA 150-150-200-10 MG PO TABS
1.0000 | ORAL_TABLET | Freq: Every day | ORAL | 6 refills | Status: DC
Start: 2023-04-09 — End: 2023-10-24

## 2023-04-09 MED ORDER — ZOSTER VAC RECOMB ADJUVANTED 50 MCG/0.5ML IM SUSR: 0.5000 mL | Freq: Once | INTRAMUSCULAR | 1 refills | Status: AC

## 2023-04-09 NOTE — Assessment & Plan Note (Signed)
Discussed importance of safe sexual practice and condom use. Condoms and STD testing offered.  Vaccinations reviewed. Covid vaccination updated. Shingrix sent to the pharmacy for completion. Due for routine dental care which she will schedule. Colon cancer, breast cancer, and cervical cancer screening up to date.

## 2023-04-09 NOTE — Assessment & Plan Note (Signed)
Increased ASCVD risk of 17.7% based on previous blood work. Discussed recommendations for restarting rosuvastatin as newer research has shown improvements with HIV associated inflammation as well. Restart rosuvastatin.

## 2023-04-09 NOTE — Patient Instructions (Signed)
Nice to see you. ? ?We will check your lab work today. ? ?Continue to take your medication daily as prescribed. ? ?Refills have been sent to the pharmacy. ? ?Plan for follow up in 6 months or sooner if needed with lab work on the same day. ? ?Have a great day and stay safe! ? ?

## 2023-04-09 NOTE — Assessment & Plan Note (Addendum)
Vanessa Browning continues to have well-controlled virus with good adherence and tolerance to Genvoya.  Reviewed previous lab work and discussed plan of care and U equals U.  Check blood work.  Continue current dose of Genvoya.  Plan for follow-up in 6 months or sooner if needed with lab work on the same day.

## 2023-04-09 NOTE — Assessment & Plan Note (Signed)
Vanessa Browning continues to smoke 1 cigarette and vape during the day.  Counseled on importance of tobacco cessation to reduce risk of disease in the future.  She is in the precontemplation stage of change.

## 2023-04-09 NOTE — Progress Notes (Signed)
Brief Narrative   Patient ID: Vanessa Browning, female    DOB: 01-Dec-1965, 57 y.o.   MRN: [id number]  Vanessa Browning is a 57 y/o AA female diagnosed with HIV disease around 55 with risk factor of heterosexual contact. Initial CD4 count and viral load unknown. Genotype fro 03/2013 with K103N resistance. No history of opportunistic infection. [id number] negative. Previous ART history with Kaletra, Isentress, Truvada and now Genvoya.   Subjective:    Chief Complaint  Patient presents with   Follow-up    HPI:  Vanessa Browning is a 57 y.o. female with HIV disease last seen on 01/16/2023 with well-controlled virus and good adherence and tolerance to Genvoya.  Viral load was undetectable with CD4 count 760.  Kidney function electrolytes within normal ranges.  Here today for routine follow-up.  [name] has been doing well since her last office visit and continues to take 24 units as prescribed with no adverse side effects or problems obtaining medication from the pharmacy.  Not covered through Medicaid.  Continues to smoke 1 cigarette/day as well as a vape.  No new concerns/complaints.  Vaccinations reviewed.  Colon cancer, breast cancer, and cervical cancer screenings are up-to-date per recommendations.  Denies fevers, chills, night sweats, headaches, changes in vision, neck pain/stiffness, nausea, diarrhea, vomiting, lesions or rashes.  Lab Results  Component Value Date   CD4TCELL 27 (L) 01/16/2023   CD4TABS 521 10/02/2022   Lab Results  Component Value Date   HIV1RNAQUANT <20 (H) 01/16/2023     Allergies  Allergen Reactions   Dexilant [Dexlansoprazole]     Hands swelling       Outpatient Medications Prior to Visit  Medication Sig Dispense Refill   albuterol (VENTOLIN HFA) 108 (90 Base) MCG/ACT inhaler Inhale 1-2 puffs into the lungs every 6 (six) hours as needed for wheezing or shortness of breath. 18 g 0   diclofenac Sodium (VOLTAREN) 1 % GEL APPLY 2 GRAMS TO  AFFECTED AREA 4 TIMES A DAY 300 g 3   gabapentin (NEURONTIN) 300 MG capsule Take 1 capsule (300 mg total) by mouth 3 (three) times daily. 90 capsule 2   lisinopril (ZESTRIL) 20 MG tablet Take 1 tablet (20 mg total) by mouth daily. 30 tablet 11   varenicline (CHANTIX) 0.5 MG tablet TAKE 1 TABLET BY MOUTH 2 TIMES DAILY. 180 tablet 1   venlafaxine XR (EFFEXOR-XR) 37.5 MG 24 hr capsule TAKE 1 CAPSULE BY MOUTH DAILY WITH BREAKFAST. 90 capsule 1   elvitegravir-cobicistat-emtricitabine-tenofovir (GENVOYA) 150-150-200-10 MG TABS tablet Take 1 tablet by mouth daily with breakfast. 30 tablet 6   methocarbamol (ROBAXIN) 500 MG tablet Take 1 tablet (500 mg total) by mouth every 8 (eight) hours as needed for muscle spasms. (Patient not taking: Reported on 04/09/2023) 30 tablet 0   olopatadine (PATANOL) 0.1 % ophthalmic solution Place 1 drop into both eyes 2 (two) times daily. (Patient not taking: Reported on 01/16/2023) 5 mL 0   trimethoprim-polymyxin b (POLYTRIM) ophthalmic solution Place 1 drop into the right eye every 4 (four) hours. Use as directed for 5-7 days. (Patient not taking: Reported on 01/16/2023) 10 mL 0   rosuvastatin (CRESTOR) 5 MG tablet TAKE 1 TABLET (5 MG TOTAL) BY MOUTH DAILY. (Patient not taking: Reported on 01/16/2023) 90 tablet 1   No facility-administered medications prior to visit.     Past Medical History:  Diagnosis Date   Allergy    seasonal   GERD (gastroesophageal reflux disease)    HIV (human  immunodeficiency virus infection) (HCC)    Hyperlipidemia    Kidney stone    Substance abuse (HCC)      Past Surgical History:  Procedure Laterality Date   APPENDECTOMY     APPENDECTOMY     CHOLECYSTECTOMY     INDUCED ABORTION     TONSILLECTOMY        Review of Systems  Constitutional:  Negative for appetite change, chills, diaphoresis, fatigue, fever and unexpected weight change.  Eyes:        Negative for acute change in vision  Respiratory:  Negative for chest tightness,  shortness of breath and wheezing.   Cardiovascular:  Negative for chest pain.  Gastrointestinal:  Negative for diarrhea, nausea and vomiting.  Genitourinary:  Negative for dysuria, pelvic pain and vaginal discharge.  Musculoskeletal:  Negative for neck pain and neck stiffness.  Skin:  Negative for rash.  Neurological:  Negative for seizures, syncope, weakness and headaches.  Hematological:  Negative for adenopathy. Does not bruise/bleed easily.  Psychiatric/Behavioral:  Negative for hallucinations.       Objective:    BP 131/76   Pulse 60   Temp 97.8 F (36.6 C) (Temporal)   Ht 5\' 4"  (1.626 m)   Wt 182 lb (82.6 kg)   SpO2 96%   BMI 31.24 kg/m  Nursing note and vital signs reviewed.  Physical Exam Constitutional:      General: She is not in acute distress.    Appearance: She is well-developed.  Eyes:     Conjunctiva/sclera: Conjunctivae normal.  Cardiovascular:     Rate and Rhythm: Normal rate and regular rhythm.     Heart sounds: Normal heart sounds. No murmur heard.    No friction rub. No gallop.  Pulmonary:     Effort: Pulmonary effort is normal. No respiratory distress.     Breath sounds: Normal breath sounds. No wheezing or rales.  Chest:     Chest wall: No tenderness.  Abdominal:     General: Bowel sounds are normal.     Palpations: Abdomen is soft.     Tenderness: There is no abdominal tenderness.  Musculoskeletal:     Cervical back: Neck supple.  Lymphadenopathy:     Cervical: No cervical adenopathy.  Skin:    General: Skin is warm and dry.     Findings: No rash.  Neurological:     Mental Status: She is alert and oriented to person, place, and time.  Psychiatric:        Behavior: Behavior normal.        Thought Content: Thought content normal.        Judgment: Judgment normal.         02/20/2023    4:41 PM 01/16/2023    3:37 PM 10/08/2022    9:14 AM 10/02/2022   10:47 AM 04/09/2022   10:45 AM  Depression screen PHQ 2/9  Decreased Interest 0 0 0 0  0  Down, Depressed, Hopeless 0 0 1 0 0  PHQ - 2 Score 0 0 1 0 0       Assessment & Plan:    Patient Active Problem List   Diagnosis Date Noted   Polypharmacy 01/16/2023   Breast pain, left 08/07/2022   History of hyperglycemia 12/18/2021   Chronic low back pain without sciatica 07/24/2021   Generalized anxiety disorder 02/12/2020   Essential hypertension 02/12/2020   Healthcare maintenance 09/04/2018   LGSIL on Pap smear of cervix 11/06/2017   Obesity, Class I, BMI  30-34.9 01/20/2016   Hyperlipidemia 10/01/2012   Tobacco abuse 05/31/2011   Screening for cervical cancer 10/06/2010   Cervical herniated disc 12/09/2008   Mononeuropathy 08/19/2008   Human immunodeficiency virus (HIV) disease (HCC) 01/13/2007   Allergic rhinitis 01/13/2007     Problem List Items Addressed This Visit       Other   Human immunodeficiency virus (HIV) disease (HCC) (Chronic)    [place] continues to have well-controlled virus with good adherence and tolerance to Genvoya.  Reviewed previous lab work and discussed plan of care and U equals U.  Check blood work.  Continue current dose of Genvoya.  Plan for follow-up in 6 months or sooner if needed with lab work on the same day.      Relevant Medications   elvitegravir-cobicistat-emtricitabine-tenofovir (GENVOYA) 150-150-200-10 MG TABS tablet   Zoster Vaccine Adjuvanted [facility]) injection   Other Relevant Orders   COMPLETE METABOLIC PANEL WITH GFR   T-helper cell (CD4)- ([facility] clinic only)   HIV-1 RNA quant-no reflex-bld   Tobacco abuse (Chronic)    Oveda continues to smoke 1 cigarette and vape during the day.  Counseled on importance of tobacco cessation to reduce risk of disease in the future.  She is in the precontemplation stage of change.      Hyperlipidemia (Chronic)    Increased ASCVD risk of 17.7% based on previous blood work. Discussed recommendations for restarting rosuvastatin as newer research has shown improvements with HIV associated  inflammation as well. Restart rosuvastatin.       Relevant Medications   rosuvastatin (CRESTOR) 5 MG tablet   Healthcare maintenance    Discussed importance of safe sexual practice and condom use. Condoms and STD testing offered.  Vaccinations reviewed. Covid vaccination updated. Shingrix sent to the pharmacy for completion. Due for routine dental care which she will schedule. Colon cancer, breast cancer, and cervical cancer screening up to date.       Other Visit Diagnoses     Encounter for immunization    -  Primary   Relevant Orders   Pfizer Comirnaty Covid-19 Vaccine 48yrs & older (Completed)        I am having Arnetha Gula Figgs start on Zoster Vaccine Adjuvanted. I am also having her maintain her albuterol, olopatadine, trimethoprim-polymyxin b, venlafaxine XR, lisinopril, methocarbamol, gabapentin, varenicline, diclofenac Sodium, rosuvastatin, and Genvoya.   Meds ordered this encounter  Medications   rosuvastatin (CRESTOR) 5 MG tablet    Sig: Take 1 tablet (5 mg total) by mouth daily.    Dispense:  90 tablet    Refill:  1    Order Specific Question:   Supervising Provider    Answer:   Judyann Munson [[id number]]   elvitegravir-cobicistat-emtricitabine-tenofovir (GENVOYA) 150-150-200-10 MG TABS tablet    Sig: Take 1 tablet by mouth daily with breakfast.    Dispense:  30 tablet    Refill:  6    Order Specific Question:   Supervising Provider    Answer:   Judyann Munson [phone]    Order Specific Question:   Prescription Type:    Answer:   Renewal   Zoster Vaccine Adjuvanted [facility]) injection    Sig: Inject 0.5 mLs into the muscle once for 1 dose. Repeat in 2-6 months    Dispense:  0.5 mL    Refill:  1    Order Specific Question:   Supervising Provider    Answer:   Judyann Munson [[id number]]     Follow-up: Return in about 6 months (around 10/08/2023).  or sooner if needed.    Marcos Eke, MSN, FNP-C Nurse Practitioner [facility] Main number: [phone]

## 2023-04-10 LAB — T-HELPER CELL (CD4) - (RCID CLINIC ONLY)
CD4 % Helper T Cell: 27 % — ABNORMAL LOW (ref 33–65)
CD4 T Cell Abs: 506 /uL (ref 400–1790)

## 2023-04-12 LAB — COMPLETE METABOLIC PANEL WITH GFR
AG Ratio: 1.3 (calc) (ref 1.0–2.5)
ALT: 17 U/L (ref 6–29)
AST: 16 U/L (ref 10–35)
Albumin: 4.2 g/dL (ref 3.6–5.1)
Alkaline phosphatase (APISO): 101 U/L (ref 37–153)
BUN: 14 mg/dL (ref 7–25)
CO2: 29 mmol/L (ref 20–32)
Calcium: 9.3 mg/dL (ref 8.6–10.4)
Chloride: 103 mmol/L (ref 98–110)
Creat: 0.79 mg/dL (ref 0.50–1.03)
Globulin: 3.2 g/dL (ref 1.9–3.7)
Glucose, Bld: 90 mg/dL (ref 65–99)
Potassium: 4.2 mmol/L (ref 3.5–5.3)
Sodium: 140 mmol/L (ref 135–146)
Total Bilirubin: 0.4 mg/dL (ref 0.2–1.2)
Total Protein: 7.4 g/dL (ref 6.1–8.1)
eGFR: 87 mL/min/{1.73_m2} (ref >=60)

## 2023-04-12 LAB — HIV-1 RNA QUANT-NO REFLEX-BLD
HIV 1 RNA Quant: NOT DETECTED {copies}/mL
HIV-1 RNA Quant, Log: NOT DETECTED {Log_copies}/mL

## 2023-05-07 ENCOUNTER — Ambulatory Visit
Admission: EM | Admit: 2023-05-07 | Discharge: 2023-05-07 | Disposition: A | Discharge status: Home / Self Care | Payer: Medicaid Other | Attending: Family Medicine | Admitting: Family Medicine

## 2023-05-07 DIAGNOSIS — H1032 Unspecified acute conjunctivitis, left eye: Secondary | ICD-10-CM | POA: Diagnosis not present

## 2023-05-07 MED ORDER — POLYMYXIN B-TRIMETHOPRIM 10000-0.1 UNIT/ML-% OP SOLN: 1.0000 [drp] | Freq: Three times a day (TID) | OPHTHALMIC | 0 refills | Status: AC

## 2023-05-07 NOTE — ED Triage Notes (Signed)
Patient presents with left eye that is "itching and irritated", states it is closed in the morning. Treated with a warm washcloth and drops that was prescribed on her last visit.

## 2023-05-07 NOTE — Discharge Instructions (Signed)
Wash her hands frequently to prevent spread of infection. Use medications as directed.

## 2023-05-07 NOTE — ED Provider Notes (Signed)
Vanessa Browning    [name]: [id number] Arrival date & time: 05/07/23  4098      History   Chief Complaint Chief Complaint  Patient presents with   Eye Problem    Itchy and drainage     HPI Vanessa Browning is a 57 y.o. female.   HPI Patient presents today with left itching, irritation, drainage.  Patient has no other associated symptoms.  Patient denies any visual acuity changes.  Patient has been using leftover Polytrim to treat symptoms however has not had enough to complete entire course of treatment. Denies any associated URI symptoms. Past Medical History:  Diagnosis Date   Allergy    seasonal   GERD (gastroesophageal reflux disease)    HIV (human immunodeficiency virus infection) (HCC)    Hyperlipidemia    Kidney stone    Substance abuse (HCC)     Patient Active Problem List   Diagnosis Date Noted   Polypharmacy 01/16/2023   Breast pain, left 08/07/2022   History of hyperglycemia 12/18/2021   Chronic low back pain without sciatica 07/24/2021   Generalized anxiety disorder 02/12/2020   Essential hypertension 02/12/2020   Healthcare maintenance 09/04/2018   LGSIL on Pap smear of cervix 11/06/2017   Obesity, Class I, BMI 30-34.9 01/20/2016   Hyperlipidemia 10/01/2012   Tobacco abuse 05/31/2011   Screening for cervical cancer 10/06/2010   Cervical herniated disc 12/09/2008   Mononeuropathy 08/19/2008   Human immunodeficiency virus (HIV) disease (HCC) 01/13/2007   Allergic rhinitis 01/13/2007    Past Surgical History:  Procedure Laterality Date   APPENDECTOMY     APPENDECTOMY     CHOLECYSTECTOMY     INDUCED ABORTION     TONSILLECTOMY      OB History     Gravida  6   Para  0   Term  0   Preterm  0   AB  1   Living         SAB  0   IAB  0   Ectopic  0   Multiple      Live Births               Home Medications    Prior to Admission medications   Medication Sig Start Date End Date Taking? Authorizing Provider   trimethoprim-polymyxin b (POLYTRIM) ophthalmic solution Place 1 drop into the left eye in the morning, at noon, and at bedtime for 7 days. 05/07/23 05/14/23 Yes Bing Neighbors, NP  albuterol (VENTOLIN HFA) 108 (90 Base) MCG/ACT inhaler Inhale 1-2 puffs into the lungs every 6 (six) hours as needed for wheezing or shortness of breath. 02/10/21   Wallis Bamberg, PA-C  diclofenac Sodium (VOLTAREN) 1 % GEL APPLY 2 GRAMS TO AFFECTED AREA 4 TIMES A DAY 04/02/23   Rocky Morel, DO  elvitegravir-cobicistat-emtricitabine-tenofovir (GENVOYA) 150-150-200-10 MG TABS tablet Take 1 tablet by mouth daily with breakfast. 04/09/23   Veryl Speak, FNP  gabapentin (NEURONTIN) 300 MG capsule Take 1 capsule (300 mg total) by mouth 3 (three) times daily. 01/23/23 04/23/23  Morene Crocker, MD  lisinopril (ZESTRIL) 20 MG tablet Take 1 tablet (20 mg total) by mouth daily. 01/23/23 01/23/24  Morene Crocker, MD  methocarbamol (ROBAXIN) 500 MG tablet Take 1 tablet (500 mg total) by mouth every 8 (eight) hours as needed for muscle spasms. Patient not taking: Reported on 04/09/2023 01/23/23   Morene Crocker, MD  olopatadine (PATANOL) 0.1 % ophthalmic solution Place 1 drop into both eyes 2 (two)  times daily. Patient not taking: Reported on 01/16/2023 08/20/22   Carlisle Beers, FNP  rosuvastatin (CRESTOR) 5 MG tablet Take 1 tablet (5 mg total) by mouth daily. 04/09/23 04/08/24  Veryl Speak, FNP  varenicline (CHANTIX) 0.5 MG tablet TAKE 1 TABLET BY MOUTH 2 TIMES DAILY. 02/14/23   Rocky Morel, DO  venlafaxine XR (EFFEXOR-XR) 37.5 MG 24 hr capsule TAKE 1 CAPSULE BY MOUTH DAILY WITH BREAKFAST. 01/03/23   Rocky Morel, DO    Family History Family History  Problem Relation Age of Onset   Diabetes Brother    Hypertension Brother    Arthritis Mother    Healthy Father    Stomach cancer Neg Hx    Rectal cancer Neg Hx    Colon cancer Neg Hx    Colon polyps Neg Hx     Social History Social  History   Tobacco Use   Smoking status: Some Days    Current packs/day: 0.10    Average packs/day: 0.1 packs/day for 30.0 years (3.0 ttl pk-yrs)    Types: Cigarettes   Smokeless tobacco: Never   Tobacco comments:    "cutting back," patient is ready to quit, takes about a week to go through a pack  Vaping Use   Vaping status: Some Days  Substance Use Topics   Alcohol use: Yes    Comment: occasional   Drug use: No    Comment: greater than 35 years ago     Allergies   Dexilant [dexlansoprazole]   Review of Systems Review of Systems Pertinent negatives listed in HPI   Physical Exam Triage Vital Signs ED Triage Vitals  Encounter Vitals Group     BP 05/07/23 0833 (!) 147/71     Systolic BP Percentile --      Diastolic BP Percentile --      Pulse Rate 05/07/23 0833 67     Resp 05/07/23 0833 18     Temp 05/07/23 0833 98.2 F (36.8 C)     Temp Source 05/07/23 0833 Oral     SpO2 05/07/23 0833 100 %     Weight 05/07/23 0834 182 lb (82.6 kg)     Height 05/07/23 0833 5' (1.524 m)     Head Circumference --      Peak Flow --      Pain Score 05/07/23 0830 0     Pain Loc --      Pain Education --      Exclude from Growth Chart --    No data found.  Updated Vital Signs BP (!) 147/71 (BP Location: Left Arm)   Pulse 67   Temp 98.2 F (36.8 C) (Oral)   Resp 18   Ht 5\' 2"  (1.575 m)   Wt 182 lb (82.6 kg)   SpO2 100%   BMI 33.29 kg/m   Visual Acuity Right Eye Distance: 20/25 Left Eye Distance: 20/25 Bilateral Distance: 20/20  Right Eye Near:   Left Eye Near:    Bilateral Near:     Physical Exam Vitals reviewed.  Constitutional:      Appearance: Normal appearance.  HENT:     Head: Normocephalic and atraumatic.     Nose: Nose normal.  Eyes:     General: Lids are normal.        Left eye: Discharge present.    Conjunctiva/sclera:     Left eye: Left conjunctiva is injected. Exudate present.  Cardiovascular:     Rate and Rhythm: Normal rate and regular rhythm.  Pulmonary:     Effort: Pulmonary effort is normal.     Breath sounds: Normal breath sounds.  Musculoskeletal:     Cervical back: Normal range of motion and neck supple.  Skin:    General: Skin is warm.  Neurological:     General: No focal deficit present.     Mental Status: She is alert.      UC Treatments / Results  Labs (all labs ordered are listed, but only abnormal results are displayed) Labs Reviewed - No data to display  EKG   Radiology No results found.  Procedures Procedures (including critical Browning time)  Medications Ordered in UC Medications - No data to display  Initial Impression / Assessment and Plan / UC Course  I have reviewed the triage vital signs and the nursing notes.  Pertinent labs & imaging results that were available during my Browning of the patient were reviewed by me and considered in my medical decision making (see chart for details).    Acute conjunctivitis, left eye, treatment with Polytrim 1 drop TID x 7 days. Return if symptoms worsen or doesn't improve.  Final Clinical Impressions(s) / UC Diagnoses   Final diagnoses:  Acute bacterial conjunctivitis of left eye     Discharge Instructions      Wash her hands frequently to prevent spread of infection. Use medications as directed.     ED Prescriptions     Medication Sig Dispense Auth. Provider   trimethoprim-polymyxin b (POLYTRIM) ophthalmic solution Place 1 drop into the left eye in the morning, at noon, and at bedtime for 7 days. 10 mL Bing Neighbors, NP      PDMP not reviewed this encounter.   Bing Neighbors, NP 05/12/23 [phone]

## 2023-06-06 ENCOUNTER — Other Ambulatory Visit: Payer: Self-pay | Admitting: Student

## 2023-07-24 ENCOUNTER — Encounter (HOSPITAL_COMMUNITY): Payer: Self-pay | Admitting: Emergency Medicine

## 2023-07-24 ENCOUNTER — Other Ambulatory Visit: Payer: Self-pay

## 2023-07-24 ENCOUNTER — Emergency Department (HOSPITAL_COMMUNITY)
Admission: EM | Admit: 2023-07-24 | Discharge: 2023-07-24 | Disposition: A | Discharge status: Home / Self Care | Payer: Medicaid Other | Attending: Emergency Medicine | Admitting: Emergency Medicine

## 2023-07-24 DIAGNOSIS — U071 COVID-19: Secondary | ICD-10-CM | POA: Diagnosis not present

## 2023-07-24 DIAGNOSIS — Z79899 Other long term (current) drug therapy: Secondary | ICD-10-CM | POA: Insufficient documentation

## 2023-07-24 DIAGNOSIS — Z21 Asymptomatic human immunodeficiency virus [HIV] infection status: Secondary | ICD-10-CM | POA: Diagnosis not present

## 2023-07-24 DIAGNOSIS — R0981 Nasal congestion: Secondary | ICD-10-CM | POA: Diagnosis present

## 2023-07-24 DIAGNOSIS — I10 Essential (primary) hypertension: Secondary | ICD-10-CM | POA: Diagnosis not present

## 2023-07-24 LAB — RESP PANEL BY RT-PCR (RSV, FLU A&B, COVID)  RVPGX2
Influenza A by PCR: NEGATIVE
Influenza B by PCR: NEGATIVE
Resp Syncytial Virus by PCR: NEGATIVE
SARS Coronavirus 2 by RT PCR: POSITIVE — AB

## 2023-07-24 MED ORDER — IBUPROFEN 400 MG PO TABS
600.0000 mg | ORAL_TABLET | Freq: Once | ORAL | Status: AC
  Administered 2023-07-24: 600 mg via ORAL
  Filled 2023-07-24: qty 1

## 2023-07-24 NOTE — ED Triage Notes (Signed)
  Patient comes in with generalized body aches and nasal congestion that has been going on for 5 days.  Denies any fevers at home.  Taking BC powders at home with last dose yesterday afternoon.  Pain 9/10, sore/aching.

## 2023-07-24 NOTE — ED Provider Notes (Signed)
[place] [facility] AT [facility] Provider Note   [name]: [id number] Arrival date & time: 07/24/23  0446     History  Chief Complaint  Patient presents with   Generalized Body Aches   Nasal Congestion    Vanessa Browning is a 58 y.o. female.  Patient is a 58 year old female with a history of hypertension, hyperlipidemia, HIV with undetectable viral loads who is presenting today with flulike symptoms for the last 6 days.  She has had chills, cough, congestion, fatigue and headache.  She denies any increased shortness of breath or productive cough.  She has had no nausea vomiting or diarrhea.  She has been compliant with her medications.  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  The history is provided by the patient.       Home Medications Prior to Admission medications   Medication Sig Start Date End Date Taking? Authorizing Provider  albuterol (VENTOLIN HFA) 108 (90 Base) MCG/ACT inhaler Inhale 1-2 puffs into the lungs every 6 (six) hours as needed for wheezing or shortness of breath. 02/10/21   Wallis Bamberg, PA-C  diclofenac Sodium (VOLTAREN) 1 % GEL APPLY 2 GRAMS TO AFFECTED AREA 4 TIMES A DAY 04/02/23   Rocky Morel, DO  elvitegravir-cobicistat-emtricitabine-tenofovir (GENVOYA) 150-150-200-10 MG TABS tablet Take 1 tablet by mouth daily with breakfast. 04/09/23   Veryl Speak, FNP  gabapentin (NEURONTIN) 300 MG capsule Take 1 capsule (300 mg total) by mouth 3 (three) times daily. 01/23/23 04/23/23  Morene Crocker, MD  lisinopril (ZESTRIL) 20 MG tablet Take 1 tablet (20 mg total) by mouth daily. 01/23/23 01/23/24  Morene Crocker, MD  methocarbamol (ROBAXIN) 500 MG tablet Take 1 tablet (500 mg total) by mouth every 8 (eight) hours as needed for muscle spasms. Patient not taking: Reported on 04/09/2023 01/23/23   Morene Crocker, MD  olopatadine (PATANOL) 0.1  % ophthalmic solution Place 1 drop into both eyes 2 (two) times daily. Patient not taking: Reported on 01/16/2023 08/20/22   Carlisle Beers, FNP  rosuvastatin (CRESTOR) 5 MG tablet Take 1 tablet (5 mg total) by mouth daily. 04/09/23 04/08/24  Veryl Speak, FNP  varenicline (CHANTIX) 0.5 MG tablet TAKE 1 TABLET BY MOUTH 2 TIMES DAILY. 02/14/23   Rocky Morel, DO  venlafaxine XR (EFFEXOR-XR) 37.5 MG 24 hr capsule TAKE 1 CAPSULE BY MOUTH DAILY WITH BREAKFAST. 06/07/23   Rocky Morel, DO      Allergies    Dexilant [dexlansoprazole]    Review of Systems   Review of Systems  Physical Exam Updated Vital Signs BP (!) 155/76 (BP Location: Right Arm)   Pulse 89   Temp 97.9 F (36.6 C)   Resp 16   Ht 5\' 2"  (1.575 m)   Wt 82.6 kg   SpO2 100%   BMI 33.29 kg/m  Physical Exam Vitals and nursing note reviewed.  Constitutional:      General: She is not in acute distress.    Appearance: She is well-developed.  HENT:     Head: Normocephalic and atraumatic.     Right Ear: Tympanic membrane normal.     Left Ear: Tympanic membrane normal.     Nose: Congestion present.  Eyes:     Pupils: Pupils are equal, round, and reactive to light.  Cardiovascular:     Rate and Rhythm: Normal rate and regular rhythm.     Heart sounds: Normal heart sounds.  No murmur heard.    No friction rub.  Pulmonary:     Effort: Pulmonary effort is normal.     Breath sounds: Normal breath sounds. No wheezing or rales.  Musculoskeletal:     Comments: No edema  Skin:    General: Skin is warm and dry.     Findings: No rash.  Neurological:     Mental Status: She is alert and oriented to person, place, and time.     Cranial Nerves: No cranial nerve deficit.  Psychiatric:        Behavior: Behavior normal.     ED Results / Procedures / Treatments   Labs (all labs ordered are listed, but only abnormal results are displayed) Labs Reviewed  RESP PANEL BY RT-PCR (RSV, FLU A&B, COVID)  RVPGX2 - Abnormal;  Notable for the following components:      Result Value   SARS Coronavirus 2 by RT PCR POSITIVE (*)    All other components within normal limits    EKG None  Radiology No results found.  Procedures Procedures    Medications Ordered in ED Medications  ibuprofen (ADVIL) tablet 600 mg (600 mg Oral Given 07/24/23 0514)    ED Course/ Medical Decision Making/ A&P                                 Medical Decision Making  Pt with symptoms consistent with viral URI.  Well appearing here.  No signs of breathing difficulty  No signs of pharyngitis, otitis or abnormal abdominal findings.  Sats 100% on RA.  Repeat BP was 135/88.  Covid is positive.  Low suspicion for opportunistic infection at this time.  Patient's breath sounds are clear and low suspicion for pneumonia.  Continue supportive care.          Final Clinical Impression(s) / ED Diagnoses Final diagnoses:  COVID    Rx / DC Orders ED Discharge Orders     None         Gwyneth Sprout, MD 07/24/23 [phone]

## 2023-07-24 NOTE — Discharge Instructions (Signed)
Continue staying hydrated and rest.  Tylenol and ibuprofen as needed for aches and pains

## 2023-07-25 ENCOUNTER — Telehealth: Payer: Self-pay | Admitting: *Deleted

## 2023-07-25 ENCOUNTER — Ambulatory Visit: Payer: Medicaid Other | Admitting: Student

## 2023-07-25 DIAGNOSIS — J988 Other specified respiratory disorders: Secondary | ICD-10-CM | POA: Diagnosis not present

## 2023-07-25 DIAGNOSIS — B9789 Other viral agents as the cause of diseases classified elsewhere: Secondary | ICD-10-CM | POA: Diagnosis not present

## 2023-07-25 NOTE — Telephone Encounter (Signed)
Please call patient.  She states she was seen in ER and diagnosed with COVID and now she is asking what are next steps.

## 2023-07-25 NOTE — Progress Notes (Signed)
  [facility] Internal Medicine Residency Telephone Encounter Continuity Care Appointment  HPI:  This telephone encounter was created for [name] on 07/25/2023 to follow up with her PCP.  States that she started developing aches and pains on Friday. 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 Taking Tylenol. Chest hurts when she coughs and breathes hard. Tylenol is helping with the pain. Has also taken BC powder. Not having fevers/chills. Hot tea is helping her bring sputum up. Throat is very sore and congestion. Has been compliant with HIV medications.   Past Medical History:  Diagnosis Date   Allergy    seasonal   GERD (gastroesophageal reflux disease)    HIV (human immunodeficiency virus infection) (HCC)    Hyperlipidemia    Kidney stone    Substance abuse (HCC)    Current Outpatient Medications:    albuterol (VENTOLIN HFA) 108 (90 Base) MCG/ACT inhaler, Inhale 1-2 puffs into the lungs every 6 (six) hours as needed for wheezing or shortness of breath., Disp: 18 g, Rfl: 0   diclofenac Sodium (VOLTAREN) 1 % GEL, APPLY 2 GRAMS TO AFFECTED AREA 4 TIMES A DAY, Disp: 300 g, Rfl: 3   elvitegravir-cobicistat-emtricitabine-tenofovir (GENVOYA) 150-150-200-10 MG TABS tablet, Take 1 tablet by mouth daily with breakfast., Disp: 30 tablet, Rfl: 6   gabapentin (NEURONTIN) 300 MG capsule, Take 1 capsule (300 mg total) by mouth 3 (three) times daily., Disp: 90 capsule, Rfl: 2   lisinopril (ZESTRIL) 20 MG tablet, Take 1 tablet (20 mg total) by mouth daily., Disp: 30 tablet, Rfl: 11   rosuvastatin (CRESTOR) 5 MG tablet, Take 1 tablet (5 mg total) by mouth daily., Disp: 90 tablet, Rfl: 1   varenicline (CHANTIX) 0.5 MG tablet, TAKE 1 TABLET BY MOUTH 2 TIMES DAILY., Disp: 180 tablet, Rfl: 1   venlafaxine XR (EFFEXOR-XR) 37.5 MG 24 hr capsule, TAKE 1 CAPSULE BY MOUTH DAILY WITH BREAKFAST., Disp: 90 capsule, Rfl:  1  Does not need any refills at this time.   Allergies  Allergen Reactions   Dexilant [Dexlansoprazole]     Hands swelling    Smokes about 7-8 cigarettes a day.  No drugs.     ROS:  Negative unless stated above in the HPI   Assessment / Plan / Recommendations:  Please see A&P under problem oriented charting for assessment of the patient's acute and chronic medical conditions.  As always, pt is advised that if symptoms worsen or new symptoms arise, they should go to an urgent care facility or to to ER for further evaluation.   Consent and Medical Decision Making:  Patient discussed with Dr. Antony Contras This is a telephone encounter between [name] and [place] on 07/25/2023 for follow up on her covid diagnosis. The visit was conducted with the patient located at home and [place] at [facility]. The patient's identity was confirmed using their DOB and current address. The patient has consented to being evaluated through a telephone encounter and understands the associated risks (an examination cannot be done and the patient may need to come in for an appointment) / benefits (allows the patient to remain at home, decreasing exposure to coronavirus). I personally spent 20 minutes on medical discussion.

## 2023-07-25 NOTE — Telephone Encounter (Signed)
RTC to patient was diagnosed with Covid on 07/24/2023.  Has has a cough and chest congestion and chest is sore.  Unsure if she has a fever as she has been having Hot Flashes anyway.  Has been drinking a lot of tea.  Was told by the ER to follow up with her doctor.  Has a TeleHealth appointment scheduled for 1:15 with Dr Lyn Hollingshead

## 2023-07-25 NOTE — Assessment & Plan Note (Signed)
Patient recently tested for covid about a week ago. Still symptomatic, afebrile, was saturating well at [facility]. Chest pain likely MSK related given increased WOB and that she finds relief with tylenol. She states she is complaint with HIV meds, but refills not consistent. Advised to call us if symptoms worsen or do not improve in the next week or so. She does have a follow up appointment with ID in April.  -Tylenol up to 1000mg  q6hrs PRN  -ibuprofen 400-600mg  q6hrs PRN  -Netti pod  -Saline nasal spray  -Humidifier -Flonase

## 2023-07-25 NOTE — Patient Instructions (Addendum)
Thank you, Ms.Katricia Prehn Figgs for allowing [place] to provide your care today.     Remember to take the following:  -Tylenol up to 1000mg  q6hrs PRN  -ibuprofen 400-600mg  q6hrs PRN  -Netti pod  -Saline nasal spray  -Humidifier -Flonase  Should you have any questions or concerns please call the internal medicine clinic at [phone].     Manuela Neptune, MD [facility]

## 2023-07-26 NOTE — Progress Notes (Signed)
Internal Medicine Clinic Attending  Case discussed with the resident at the time of the visit.  We reviewed the resident's history and exam and pertinent patient test results.  I agree with the assessment, diagnosis, and plan of care documented in the resident's note.

## 2023-08-27 ENCOUNTER — Other Ambulatory Visit: Payer: Self-pay | Admitting: Student

## 2023-08-27 DIAGNOSIS — Z1231 Encounter for screening mammogram for malignant neoplasm of breast: Secondary | ICD-10-CM

## 2023-09-23 ENCOUNTER — Ambulatory Visit
Admission: RE | Admit: 2023-09-23 | Discharge: 2023-09-23 | Disposition: A | Discharge status: Home / Self Care | Source: Ambulatory Visit | Attending: Internal Medicine

## 2023-09-23 ENCOUNTER — Ambulatory Visit: Payer: Medicaid Other | Admitting: Family

## 2023-09-23 DIAGNOSIS — Z1231 Encounter for screening mammogram for malignant neoplasm of breast: Secondary | ICD-10-CM

## 2023-10-01 ENCOUNTER — Ambulatory Visit: Payer: Medicaid Other | Admitting: Family

## 2023-10-06 ENCOUNTER — Other Ambulatory Visit: Payer: Self-pay | Admitting: Family

## 2023-10-24 ENCOUNTER — Other Ambulatory Visit: Payer: Self-pay

## 2023-10-24 ENCOUNTER — Ambulatory Visit (INDEPENDENT_AMBULATORY_CARE_PROVIDER_SITE_OTHER): Admitting: Family

## 2023-10-24 ENCOUNTER — Encounter: Payer: Self-pay | Admitting: Family

## 2023-10-24 VITALS — BP 127/77 | HR 75 | Temp 97.2°F | Ht 64.0 in | Wt 175.0 lb

## 2023-10-24 DIAGNOSIS — B20 Human immunodeficiency virus [HIV] disease: Secondary | ICD-10-CM | POA: Diagnosis present

## 2023-10-24 DIAGNOSIS — F17210 Nicotine dependence, cigarettes, uncomplicated: Secondary | ICD-10-CM | POA: Insufficient documentation

## 2023-10-24 DIAGNOSIS — Z79899 Other long term (current) drug therapy: Secondary | ICD-10-CM

## 2023-10-24 DIAGNOSIS — Z0000 Encounter for general adult medical examination without abnormal findings: Secondary | ICD-10-CM

## 2023-10-24 MED ORDER — GENVOYA 150-150-200-10 MG PO TABS: 1.0000 | ORAL_TABLET | Freq: Every day | ORAL | 6 refills | Status: DC

## 2023-10-24 NOTE — Patient Instructions (Addendum)
 Nice to see you.  We will check your lab work today.  Continue to take your medication daily as prescribed.  Refills have been sent to the pharmacy.  Plan for follow up in 6 months or sooner if needed with lab work on the same day. Ok for virtual visit.   Have a great day and stay safe!   Smoking Cessation: QuitlineNC 1-800-QUIT-NOW 6814990610); Espaol: 1-855-Djelo-Ya (1-817-224-1919) http://carroll-castaneda.info/

## 2023-10-24 NOTE — Assessment & Plan Note (Signed)
 Ms. Rosy Cooper continues to have well-controlled virus with good adherence and tolerance to Genvoya .  Reviewed previous lab work and discussed plan of care, U equals U, and the nature of fluctuations in CD4 count.  Social determinants of health reviewed with no interventions indicated.  Covered by Medicaid.  Following discussion she will continue receiving care here through virtual visits and lab work at [facility] prior to or after visits.  Check blood work today.  Continue current dose of Genvoya .  Plan for follow-up in 6 months or sooner if needed with lab work either before or after appointment.

## 2023-10-24 NOTE — Assessment & Plan Note (Signed)
 Discussed importance of safe sexual practice and condom use. Condoms and site specific STD testing offered.  Vaccinations reviewed - encouraged to complete Shingrix at pharmacy.  Will schedule dental care independently. Declined referral to [facility] clinic. Breast cancer, colon cancer and cervical cancer screening up to date.

## 2023-10-24 NOTE — Progress Notes (Signed)
 Brief Narrative   Patient ID: Vanessa Browning, female    DOB: 03-07-1966, 58 y.o.   MRN: [id number]  [name] is a 58 y/o AA female diagnosed with HIV disease around 45 with risk factor of heterosexual contact. Initial CD4 count and viral load unknown. Genotype from 03/2013 with K103N resistance. No history of opportunistic infection. [id number] negative. Previous ART history with Kaletra, Isentress , Truvada and now Genvoya .   Subjective:   Chief Complaint  Patient presents with   Follow-up    B20     HPI:  Vanessa Browning is a 58 y.o. female with HIV disease last seen on 04/09/2023 with well-controlled virus and good adherence and tolerance to Genvoya .  Viral load was undetectable with CD4 count 506.  Kidney function, liver function, electrolytes within normal ranges.  In the interim was diagnosed with COVID in February.  Here today for routine follow-up.  [name] has been doing well since her last office visit and continues to take Genvoya  as prescribed with no adverse side effects or problems obtaining medication from the pharmacy.  Covered by Medicaid.  She is going to be moving to [place] [facility] [place] [place]  to help take care of her mother who is elderly.  Has concerns about her CD4 count fluctuations.  Working on cutting back smoking.  Condoms and site-specific STD testing offered.  Healthcare maintenance reviewed.  Housing, access to food, and transportation are all stable.   Denies fevers, chills, night sweats, headaches, changes in vision, neck pain/stiffness, nausea, diarrhea, vomiting, lesions or rashes.  Lab Results  Component Value Date   CD4TCELL 27 (L) 04/09/2023   CD4TABS 506 04/09/2023   Lab Results  Component Value Date   HIV1RNAQUANT Not Detected 04/09/2023     Allergies  Allergen Reactions   Dexilant [Dexlansoprazole]     Hands swelling      Current Outpatient Medications on File Prior to Visit  Medication Sig Dispense Refill    albuterol  (VENTOLIN  HFA) 108 (90 Base) MCG/ACT inhaler Inhale 1-2 puffs into the lungs every 6 (six) hours as needed for wheezing or shortness of breath. 18 g 0   diclofenac  Sodium (VOLTAREN ) 1 % GEL APPLY 2 GRAMS TO AFFECTED AREA 4 TIMES A DAY 300 g 3   gabapentin  (NEURONTIN ) 300 MG capsule Take 1 capsule (300 mg total) by mouth 3 (three) times daily. 90 capsule 2   lisinopril  (ZESTRIL ) 20 MG tablet Take 1 tablet (20 mg total) by mouth daily. 30 tablet 11   rosuvastatin  (CRESTOR ) 5 MG tablet TAKE 1 TABLET (5 MG TOTAL) BY MOUTH DAILY. 90 tablet 0   varenicline  (CHANTIX ) 0.5 MG tablet TAKE 1 TABLET BY MOUTH 2 TIMES DAILY. 180 tablet 1   venlafaxine  XR (EFFEXOR -XR) 37.5 MG 24 hr capsule TAKE 1 CAPSULE BY MOUTH DAILY WITH BREAKFAST. 90 capsule 1   No current facility-administered medications on file prior to visit.      Past Medical History:  Diagnosis Date   Allergy    seasonal   GERD (gastroesophageal reflux disease)    HIV (human immunodeficiency virus infection) (HCC)    Hyperlipidemia    Kidney stone    Substance abuse (HCC)      Past Surgical History:  Procedure Laterality Date   APPENDECTOMY     APPENDECTOMY     CHOLECYSTECTOMY     INDUCED ABORTION     TONSILLECTOMY            Review of Systems  Constitutional:  Negative for appetite change, chills, diaphoresis, fatigue, fever and unexpected weight change.  Eyes:        Negative for acute change in vision  Respiratory:  Negative for chest tightness, shortness of breath and wheezing.   Cardiovascular:  Negative for chest pain.  Gastrointestinal:  Negative for diarrhea, nausea and vomiting.  Genitourinary:  Negative for dysuria, pelvic pain and vaginal discharge.  Musculoskeletal:  Negative for neck pain and neck stiffness.  Skin:  Negative for rash.  Neurological:  Negative for seizures, syncope, weakness and headaches.  Hematological:  Negative for adenopathy. Does not bruise/bleed easily.   Psychiatric/Behavioral:  Negative for hallucinations.      Objective:   Today's Vitals   10/24/23 0955  BP: 127/77  Pulse: 75  Temp: (!) 97.2 F (36.2 C)  TempSrc: Temporal  SpO2: 95%  Weight: 175 lb (79.4 kg)  Height: 5\' 4"  (1.626 m)   Body mass index is 30.04 kg/m.  Nursing note and vital signs reviewed.  Physical Exam Constitutional:      General: She is not in acute distress.    Appearance: She is well-developed.  Eyes:     Conjunctiva/sclera: Conjunctivae normal.  Cardiovascular:     Rate and Rhythm: Normal rate and regular rhythm.     Heart sounds: Normal heart sounds. No murmur heard.    No friction rub. No gallop.  Pulmonary:     Effort: Pulmonary effort is normal. No respiratory distress.     Breath sounds: Normal breath sounds. No wheezing or rales.  Chest:     Chest wall: No tenderness.  Abdominal:     General: Bowel sounds are normal.     Palpations: Abdomen is soft.     Tenderness: There is no abdominal tenderness.  Musculoskeletal:     Cervical back: Neck supple.  Lymphadenopathy:     Cervical: No cervical adenopathy.  Skin:    General: Skin is warm and dry.     Findings: No rash.  Neurological:     Mental Status: She is alert and oriented to person, place, and time.  Psychiatric:        Behavior: Behavior normal.        Thought Content: Thought content normal.        Judgment: Judgment normal.          10/24/2023   10:42 AM 02/20/2023    4:41 PM 01/16/2023    3:37 PM  PHQ9 SCORE ONLY  PHQ-9 Total Score 7 0 0       10/24/2023   10:42 AM 02/12/2020   11:38 AM  GAD 7 : Generalized Anxiety Score  Nervous, Anxious, on Edge 0 3  Control/stop worrying 0 3  Worry too much - different things 1 3  Trouble relaxing 0 3  Restless 3 3  Easily annoyed or irritable 0 3  Afraid - awful might happen 0 3  Total GAD 7 Score 4 21  Anxiety Difficulty Not difficult at all Somewhat difficult     The 10-year ASCVD risk score (Arnett DK, et al.,  2019) is: 16.9%   Values used to calculate the score:     Age: 58 years     Sex: Female     Is Non-Hispanic African American: Yes     Diabetic: No     Tobacco smoker: Yes     Systolic Blood Pressure: 127 mmHg     Is BP treated: Yes     HDL Cholesterol: 43 mg/dL  Total Cholesterol: 247 mg/dL     Assessment & Plan:   Patient Active Problem List   Diagnosis Date Noted   Cigarette nicotine  dependence, uncomplicated 10/24/2023   Viral respiratory infection 07/25/2023   Polypharmacy 01/16/2023   Breast pain, left 08/07/2022   History of hyperglycemia 12/18/2021   Chronic low back pain without sciatica 07/24/2021   Generalized anxiety disorder 02/12/2020   Essential hypertension 02/12/2020   Healthcare maintenance 09/04/2018   LGSIL on Pap smear of cervix 11/06/2017   Obesity, Class I, BMI 30-34.9 01/20/2016   Hyperlipidemia 10/01/2012   Tobacco abuse 05/31/2011   Screening for cervical cancer 10/06/2010   Cervical herniated disc 12/09/2008   Mononeuropathy 08/19/2008   Human immunodeficiency virus (HIV) disease (HCC) 01/13/2007   Allergic rhinitis 01/13/2007      Human immunodeficiency virus (HIV) disease (HCC) Ms. Browning continues to have well-controlled virus with good adherence and tolerance to Genvoya .  Reviewed previous lab work and discussed plan of care, U equals U, and the nature of fluctuations in CD4 count.  Social determinants of health reviewed with no interventions indicated.  Covered by Medicaid.  Following discussion she will continue receiving care here through virtual visits and lab work at [facility] prior to or after visits.  Check blood work today.  Continue current dose of Genvoya .  Plan for follow-up in 6 months or sooner if needed with lab work either before or after appointment.  Healthcare maintenance Discussed importance of safe sexual practice and condom use. Condoms and site specific STD testing offered.  Vaccinations reviewed - encouraged to complete  Shingrix at pharmacy.  Will schedule dental care independently. Declined referral to [facility] clinic. Breast cancer, colon cancer and cervical cancer screening up to date.   Cigarette nicotine  dependence, uncomplicated [name] has approximately 33 pack year history and qualifies for CT lung cancer screening. Counseled on the dangers of tobacco and considering tobacco cessation.  Reviewed strategies to maximize success, including removing cigarettes and smoking materials from environment, stress management, substitution of other forms of reinforcement, support of family/friends, and written materials.     I am having Vanessa Browning maintain her albuterol , lisinopril , gabapentin , varenicline , diclofenac  Sodium, Genvoya , venlafaxine  XR, and rosuvastatin .    Follow-up: 6 months or sooner if needed. .    Greg Mycala Warshawsky, MSN, FNP-C Nurse Practitioner [facility] Main number: [phone]

## 2023-10-24 NOTE — Assessment & Plan Note (Signed)
 [name] has approximately 33 pack year history and qualifies for CT lung cancer screening. Counseled on the dangers of tobacco and considering tobacco cessation.  Reviewed strategies to maximize success, including removing cigarettes and smoking materials from environment, stress management, substitution of other forms of reinforcement, support of family/friends, and written materials.

## 2023-10-26 LAB — COMPLETE METABOLIC PANEL WITHOUT GFR
AG Ratio: 1.4 (calc) (ref 1.0–2.5)
ALT: 20 U/L (ref 6–29)
AST: 18 U/L (ref 10–35)
Albumin: 4.2 g/dL (ref 3.6–5.1)
Alkaline phosphatase (APISO): 93 U/L (ref 37–153)
BUN: 18 mg/dL (ref 7–25)
CO2: 22 mmol/L (ref 20–32)
Calcium: 9.3 mg/dL (ref 8.6–10.4)
Chloride: 105 mmol/L (ref 98–110)
Creat: 0.86 mg/dL (ref 0.50–1.03)
Globulin: 3 g/dL (ref 1.9–3.7)
Glucose, Bld: 83 mg/dL (ref 65–99)
Potassium: 3.8 mmol/L (ref 3.5–5.3)
Sodium: 140 mmol/L (ref 135–146)
Total Bilirubin: 0.3 mg/dL (ref 0.2–1.2)
Total Protein: 7.2 g/dL (ref 6.1–8.1)

## 2023-10-26 LAB — LIPID PANEL
Cholesterol: 169 mg/dL (ref <200)
HDL: 56 mg/dL (ref >=50)
LDL Cholesterol (Calc): 84 mg/dL
Non-HDL Cholesterol (Calc): 113 mg/dL (ref <130)
Total CHOL/HDL Ratio: 3 (calc) (ref <5.0)
Triglycerides: 191 mg/dL — ABNORMAL HIGH (ref <150)

## 2023-10-26 LAB — T-HELPER CELLS (CD4) COUNT (NOT AT ARMC)
Absolute CD4: 705 {cells}/uL (ref 490–1740)
CD4 T Helper %: 28 % — ABNORMAL LOW (ref 30–61)
Total lymphocyte count: 2550 {cells}/uL (ref 850–3900)

## 2023-10-26 LAB — HIV-1 RNA QUANT-NO REFLEX-BLD
HIV 1 RNA Quant: NOT DETECTED {copies}/mL
HIV-1 RNA Quant, Log: NOT DETECTED {Log_copies}/mL

## 2023-10-28 ENCOUNTER — Ambulatory Visit: Payer: Self-pay | Admitting: Family

## 2023-11-08 ENCOUNTER — Inpatient Hospital Stay: Admission: RE | Admit: 2023-11-08 | Source: Ambulatory Visit

## 2024-01-05 ENCOUNTER — Other Ambulatory Visit: Payer: Self-pay | Admitting: Family

## 2024-01-05 DIAGNOSIS — B20 Human immunodeficiency virus [HIV] disease: Secondary | ICD-10-CM

## 2024-01-05 DIAGNOSIS — E785 Hyperlipidemia, unspecified: Secondary | ICD-10-CM

## 2024-01-06 ENCOUNTER — Other Ambulatory Visit: Payer: Self-pay

## 2024-01-06 MED ORDER — VENLAFAXINE HCL ER 37.5 MG PO CP24: 37.5000 mg | ORAL_CAPSULE | Freq: Every day | ORAL | 1 refills | Status: AC

## 2024-01-27 ENCOUNTER — Other Ambulatory Visit: Payer: Self-pay | Admitting: Student

## 2024-01-27 DIAGNOSIS — I10 Essential (primary) hypertension: Secondary | ICD-10-CM

## 2024-01-27 NOTE — Telephone Encounter (Signed)
 Medication sent to pharmacy

## 2024-02-27 ENCOUNTER — Telehealth: Payer: Self-pay

## 2024-02-27 NOTE — Telephone Encounter (Signed)
 Patient last seen 07/25/23 I called the patient the patient to schedule a fu appointment. Unable to reach the patient, lvm for her to give us  a call back.

## 2024-03-12 ENCOUNTER — Encounter: Payer: Self-pay | Admitting: Family

## 2024-03-12 ENCOUNTER — Telehealth (INDEPENDENT_AMBULATORY_CARE_PROVIDER_SITE_OTHER): Admitting: Family

## 2024-03-12 ENCOUNTER — Other Ambulatory Visit: Payer: Self-pay

## 2024-03-12 DIAGNOSIS — B20 Human immunodeficiency virus [HIV] disease: Secondary | ICD-10-CM

## 2024-03-12 DIAGNOSIS — M5020 Other cervical disc displacement, unspecified cervical region: Secondary | ICD-10-CM

## 2024-03-12 DIAGNOSIS — F17210 Nicotine dependence, cigarettes, uncomplicated: Secondary | ICD-10-CM

## 2024-03-12 DIAGNOSIS — Z79899 Other long term (current) drug therapy: Secondary | ICD-10-CM | POA: Diagnosis not present

## 2024-03-12 DIAGNOSIS — Z0000 Encounter for general adult medical examination without abnormal findings: Secondary | ICD-10-CM

## 2024-03-12 MED ORDER — DICLOFENAC SODIUM 1 % EX GEL
2.0000 g | Freq: Four times a day (QID) | CUTANEOUS | 3 refills | Status: AC
Start: 2024-03-12 — End: ?

## 2024-03-12 MED ORDER — GENVOYA 150-150-200-10 MG PO TABS: 1.0000 | ORAL_TABLET | Freq: Every day | ORAL | 6 refills | Status: DC

## 2024-03-12 NOTE — Progress Notes (Signed)
 Brief Narrative   Patient ID: Vanessa Browning, female    DOB: 08-Sep-1965, 58 y.o.   MRN: [id number]  [name] is a 58 y/o AA female diagnosed with HIV disease around 43 with risk factor of heterosexual contact. Initial CD4 count and viral load unknown. Genotype from 03/2013 with K103N resistance. No history of opportunistic infection. [id number] negative. Previous ART history with Kaletra, Isentress , Truvada and now Genvoya .   Subjective:   Chief Complaint  Patient presents with   Follow-up    B20      Virtual Visit via Telephone/Video Note   I connected with Vanessa Browning on 03/12/2024  at 12:50 pm  by video and verified that I am speaking with the correct person using two identifiers.   I discussed the limitations, risks, security and privacy concerns of performing an evaluation and management service by telephone and the availability of in person appointments. I also discussed with the patient that there may be a patient responsible charge related to this service. The patient expressed understanding and agreed to proceed.  Location:  Patient: Home in [place], [place]  Provider: [facility], [place], [place]   HPI:  Vanessa Browning is a 58 y.o. female with HIV disease last seen on 10/24/2023 with well-controlled virus and good adherence and tolerance to Genvoya .  Viral load was undetectable with CD4 count 705.  Kidney function, liver function, electrolytes within normal ranges.  Lipid profile triglycerides 191, LDL 84, and HDL 56.  Telehealth visit today for routine follow-up.  Vanessa Browning states has been doing well since her last office visit and continues to take Genvoya  as prescribed with no adverse side effects or problems obtaining medication from the pharmacy.  Covered by Medicaid.  Now living in [facility] .  Housing, transportation, and access to food are stable.  No new concerns/complaints.  Requesting refill of diclofenac  gel.   Healthcare maintenance reviewed.  Denies fevers, chills, night sweats, headaches, changes in vision, neck pain/stiffness, nausea, diarrhea, vomiting, lesions or rashes.   Lab Results  Component Value Date   HIV1RNAQUANT NOT DETECTED 10/24/2023   Lab Results  Component Value Date   CD4TCELL 28 (L) 10/24/2023   CD4TABS 506 04/09/2023     Allergies  Allergen Reactions   Dexilant [Dexlansoprazole]     Hands swelling       Outpatient Medications Prior to Visit  Medication Sig Dispense Refill   albuterol  (VENTOLIN  HFA) 108 (90 Base) MCG/ACT inhaler Inhale 1-2 puffs into the lungs every 6 (six) hours as needed for wheezing or shortness of breath. 18 g 0   lisinopril  (ZESTRIL ) 20 MG tablet TAKE 1 TABLET BY MOUTH EVERY DAY 90 tablet 3   rosuvastatin  (CRESTOR ) 5 MG tablet TAKE 1 TABLET (5 MG TOTAL) BY MOUTH DAILY. 90 tablet 1   venlafaxine  XR (EFFEXOR -XR) 37.5 MG 24 hr capsule Take 1 capsule (37.5 mg total) by mouth daily with breakfast. 90 capsule 1   diclofenac  Sodium (VOLTAREN ) 1 % GEL APPLY 2 GRAMS TO AFFECTED AREA 4 TIMES A DAY 300 g 3   elvitegravir-cobicistat-emtricitabine -tenofovir  (GENVOYA ) 150-150-200-10 MG TABS tablet Take 1 tablet by mouth daily with breakfast. 30 tablet 6   gabapentin  (NEURONTIN ) 300 MG capsule Take 1 capsule (300 mg total) by mouth 3 (three) times daily. (Patient not taking: Reported on 03/12/2024) 90 capsule 2   varenicline  (CHANTIX ) 0.5 MG tablet TAKE 1 TABLET BY MOUTH 2 TIMES DAILY. (Patient not taking: Reported on 03/12/2024) 180 tablet  1   No facility-administered medications prior to visit.     Past Medical History:  Diagnosis Date   Allergy    seasonal   GERD (gastroesophageal reflux disease)    HIV (human immunodeficiency virus infection) (HCC)    Hyperlipidemia    Kidney stone    Substance abuse (HCC)      Past Surgical History:  Procedure Laterality Date   APPENDECTOMY     APPENDECTOMY     CHOLECYSTECTOMY     INDUCED ABORTION      TONSILLECTOMY        Review of Systems  Constitutional:  Negative for appetite change, chills, diaphoresis, fatigue, fever and unexpected weight change.  Eyes:        Negative for acute change in vision  Respiratory:  Negative for chest tightness, shortness of breath and wheezing.   Cardiovascular:  Negative for chest pain.  Gastrointestinal:  Negative for diarrhea, nausea and vomiting.  Genitourinary:  Negative for dysuria, pelvic pain and vaginal discharge.  Musculoskeletal:  Negative for neck pain and neck stiffness.  Skin:  Negative for rash.  Neurological:  Negative for seizures, syncope, weakness and headaches.  Hematological:  Negative for adenopathy. Does not bruise/bleed easily.  Psychiatric/Behavioral:  Negative for hallucinations.     Objective:   There were no vitals taken for this visit. Nursing note and vital signs reviewed.  Physical Exam Constitutional:      General: She is not in acute distress. Neurological:     Mental Status: She is alert.  Psychiatric:        Mood and Affect: Mood normal.    Limited secondary to telehealth visit     03/12/2024   12:50 PM 10/24/2023   10:42 AM 02/20/2023    4:41 PM 01/16/2023    3:37 PM 10/08/2022    9:14 AM  Depression screen PHQ 2/9  Decreased Interest 0 1 0 0 0  Down, Depressed, Hopeless 0 0 0 0 1  PHQ - 2 Score 0 1 0 0 1  Altered sleeping  2     Tired, decreased energy  2     Change in appetite  2     Feeling bad or failure about yourself   0     Trouble concentrating  0     Moving slowly or fidgety/restless  0     Suicidal thoughts  0     PHQ-9 Score  7     Difficult doing work/chores  Not difficult at all           10/24/2023   10:42 AM 02/12/2020   11:38 AM  GAD 7 : Generalized Anxiety Score  Nervous, Anxious, on Edge 0 3  Control/stop worrying 0 3  Worry too much - different things 1 3  Trouble relaxing 0 3  Restless 3 3  Easily annoyed or irritable 0 3  Afraid - awful might happen 0 3  Total GAD  7 Score 4 21  Anxiety Difficulty Not difficult at all Somewhat difficult     The 10-year ASCVD risk score (Arnett DK, et al., 2019) is: 10.3%   Values used to calculate the score:     Age: 1 years     Clincally relevant sex: Female     Is Non-Hispanic African American: Yes     Diabetic: No     Tobacco smoker: Yes     Systolic Blood Pressure: 127 mmHg     Is BP treated: Yes  HDL Cholesterol: 56 mg/dL     Total Cholesterol: 169 mg/dL  Assessment & Plan:    Patient Active Problem List   Diagnosis Date Noted   Cigarette nicotine  dependence, uncomplicated 10/24/2023   Viral respiratory infection 07/25/2023   Polypharmacy 01/16/2023   Breast pain, left 08/07/2022   History of hyperglycemia 12/18/2021   Chronic low back pain without sciatica 07/24/2021   Generalized anxiety disorder 02/12/2020   Essential hypertension 02/12/2020   Healthcare maintenance 09/04/2018   LGSIL on Pap smear of cervix 11/06/2017   Obesity, Class I, BMI 30-34.9 01/20/2016   Hyperlipidemia 10/01/2012   Tobacco abuse 05/31/2011   Screening for cervical cancer 10/06/2010   Cervical herniated disc 12/09/2008   Mononeuropathy 08/19/2008   Human immunodeficiency virus (HIV) disease (HCC) 01/13/2007   Allergic rhinitis 01/13/2007     Problem List Items Addressed This Visit       Musculoskeletal and Integument   Cervical herniated disc   Relevant Medications   diclofenac  Sodium (VOLTAREN ) 1 % GEL     Other   Human immunodeficiency virus (HIV) disease (HCC) (Chronic)   [name] continues to have well-controlled virus with good adherence and tolerance to Genvoya .  Reviewed previous lab work and discussed plan of care and U equals U.  No problems obtaining medication from the pharmacy and covered by Medicaid.  Social determinants of health reviewed with no interventions indicated.  Check blood work.  Continue current dose of Genvoya .  Plan for follow-up in 6 months or sooner if needed with lab  work on the same day or through [facility].      Relevant Medications   elvitegravir-cobicistat-emtricitabine -tenofovir  (GENVOYA ) 150-150-200-10 MG TABS tablet   Other Relevant Orders   Comprehensive metabolic panel with GFR   HIV-1 RNA quant-no reflex-bld   T-helper cell (CD4)- ([facility] clinic only)   Healthcare maintenance   Discussed importance of safe sexual practice and condom use. Condoms and site specific STD testing offered when present Vaccinations reviewed and following counseling will update influenza and COVID on 03/23/2024 when she is in the area with nurse visit scheduled. Colon cancer, breast cancer, and cervical cancer screenings up-to-date. Routine dental care up-to-date.      Cigarette nicotine  dependence, uncomplicated   Continues to smoke some days. Counseled on the dangers of tobacco not ready to quit at this time.  Reviewed strategies to maximize success, including removing cigarettes and smoking materials from environment, stress management, substitution of other forms of reinforcement, support of family/friends, and written materials.        Other Visit Diagnoses       Pharmacologic therapy    -  Primary   Relevant Orders   Lipid panel        I have changed Vanessa Browning's diclofenac  Sodium. I am also having her maintain her albuterol , gabapentin , varenicline , rosuvastatin , venlafaxine  XR, lisinopril , and Genvoya .   Meds ordered this encounter  Medications   elvitegravir-cobicistat-emtricitabine -tenofovir  (GENVOYA ) 150-150-200-10 MG TABS tablet    Sig: Take 1 tablet by mouth daily with breakfast.    Dispense:  30 tablet    Refill:  6    Supervising Provider:   SNIDER, CYNTHIA [phone]    Prescription Type::   Renewal   diclofenac  Sodium (VOLTAREN ) 1 % GEL    Sig: Apply 2 g topically 4 (four) times daily.    Dispense:  300 g    Refill:  3    Supervising Provider:   LUIZ CHANNEL [phone]     I  discussed the assessment and treatment plan with the  patient. The patient was provided an opportunity to ask questions and all were answered. The patient agreed with the plan and demonstrated an understanding of the instructions.   The patient was advised to call back or seek an in-person evaluation if the symptoms worsen or if the condition fails to improve as anticipated.   I provided 11  minutes of non-face-to-face time during this encounter.  Follow-up: Return in about 6 months (around 09/10/2024).   Cathlyn July, MSN, FNP-C Nurse Practitioner [facility] Main number: [phone]

## 2024-03-12 NOTE — Assessment & Plan Note (Signed)
 Discussed importance of safe sexual practice and condom use. Condoms and site specific STD testing offered when present Vaccinations reviewed and following counseling will update influenza and COVID on 03/23/2024 when she is in the area with nurse visit scheduled. Colon cancer, breast cancer, and cervical cancer screenings up-to-date. Routine dental care up-to-date.

## 2024-03-12 NOTE — Patient Instructions (Signed)
 Nice to see you.  We will check your lab work on 10/13 as discussed  Continue to take your medication daily as prescribed.  Refills have been sent to the pharmacy.  Plan for follow up in 6 months or sooner if needed with lab work on the same day.  Have a great day and stay safe!

## 2024-03-12 NOTE — Assessment & Plan Note (Signed)
 Continues to smoke some days. Counseled on the dangers of tobacco not ready to quit at this time.  Reviewed strategies to maximize success, including removing cigarettes and smoking materials from environment, stress management, substitution of other forms of reinforcement, support of family/friends, and written materials.

## 2024-03-12 NOTE — Assessment & Plan Note (Signed)
 Ms. Davied continues to have well-controlled virus with good adherence and tolerance to Genvoya .  Reviewed previous lab work and discussed plan of care and U equals U.  No problems obtaining medication from the pharmacy and covered by Medicaid.  Social determinants of health reviewed with no interventions indicated.  Check blood work.  Continue current dose of Genvoya .  Plan for follow-up in 6 months or sooner if needed with lab work on the same day or through [facility].

## 2024-03-23 ENCOUNTER — Telehealth: Payer: Self-pay | Admitting: Family

## 2024-03-23 ENCOUNTER — Ambulatory Visit

## 2024-03-23 ENCOUNTER — Other Ambulatory Visit

## 2024-03-23 NOTE — Telephone Encounter (Signed)
 Vanessa Browning called requesting lab orders to her PCP. Dr. Dominga Puff of [facility] at [address] [place], [place] [address]. Pt can be reached at [phone].

## 2024-03-23 NOTE — Telephone Encounter (Addendum)
 Orders faxed [phone]

## 2024-04-16 ENCOUNTER — Ambulatory Visit: Admitting: Family

## 2024-05-18 ENCOUNTER — Other Ambulatory Visit: Payer: Self-pay

## 2024-05-18 DIAGNOSIS — B20 Human immunodeficiency virus [HIV] disease: Secondary | ICD-10-CM

## 2024-05-18 MED ORDER — GENVOYA 150-150-200-10 MG PO TABS: 1.0000 | ORAL_TABLET | Freq: Every day | ORAL | 5 refills | Status: AC
